# Patient Record
Sex: Female | Born: 1950
Health system: Southern US, Community
[De-identification: ages and names within clinical notes are randomized; demographics above are authoritative.]

## PROBLEM LIST (undated history)

## (undated) DIAGNOSIS — I10 Essential (primary) hypertension: Secondary | ICD-10-CM

## (undated) DIAGNOSIS — D649 Anemia, unspecified: Secondary | ICD-10-CM

## (undated) DIAGNOSIS — M1990 Unspecified osteoarthritis, unspecified site: Secondary | ICD-10-CM

## (undated) DIAGNOSIS — T7840XA Allergy, unspecified, initial encounter: Secondary | ICD-10-CM

## (undated) DIAGNOSIS — E785 Hyperlipidemia, unspecified: Secondary | ICD-10-CM

## (undated) DIAGNOSIS — I2510 Atherosclerotic heart disease of native coronary artery without angina pectoris: Secondary | ICD-10-CM

## (undated) HISTORY — PX: TUBAL LIGATION: SHX77

## (undated) HISTORY — PX: ARTHROSCOPY WITH ANTERIOR CRUCIATE LIGAMENT (ACL) REPAIR WITH ANTERIOR TIBILIAS GRAFT: SHX6503

## (undated) HISTORY — PX: BUNIONECTOMY: SHX129

## (undated) HISTORY — PX: CHOLECYSTECTOMY: SHX55

## (undated) HISTORY — PX: FRACTURE SURGERY: SHX138

## (undated) HISTORY — DX: Hyperlipidemia, unspecified: E78.5

## (undated) HISTORY — DX: Essential (primary) hypertension: I10

## (undated) HISTORY — PX: JOINT REPLACEMENT: SHX530

## (undated) HISTORY — DX: Anemia, unspecified: D64.9

## (undated) HISTORY — PX: APPENDECTOMY: SHX54

## (undated) HISTORY — DX: Allergy, unspecified, initial encounter: T78.40XA

## (undated) HISTORY — PX: ABDOMINAL HYSTERECTOMY: SHX81

---

## 1991-02-12 HISTORY — PX: CARDIAC CATHETERIZATION: SHX172

## 1993-11-12 HISTORY — PX: CARDIAC CATHETERIZATION: SHX172

## 1996-12-03 HISTORY — PX: CARDIAC CATHETERIZATION: SHX172

## 2000-10-10 ENCOUNTER — Encounter: Payer: Self-pay | Admitting: Obstetrics and Gynecology

## 2000-10-10 ENCOUNTER — Ambulatory Visit (HOSPITAL_COMMUNITY): Admission: RE | Admit: 2000-10-10 | Discharge: 2000-10-10 | Payer: Self-pay | Admitting: Obstetrics and Gynecology

## 2000-10-20 ENCOUNTER — Ambulatory Visit (HOSPITAL_COMMUNITY): Admission: RE | Admit: 2000-10-20 | Discharge: 2000-10-20 | Payer: Self-pay | Admitting: Obstetrics and Gynecology

## 2000-10-20 ENCOUNTER — Encounter: Payer: Self-pay | Admitting: Obstetrics and Gynecology

## 2001-10-13 ENCOUNTER — Encounter: Payer: Self-pay | Admitting: Obstetrics and Gynecology

## 2001-10-13 ENCOUNTER — Ambulatory Visit (HOSPITAL_COMMUNITY): Admission: RE | Admit: 2001-10-13 | Discharge: 2001-10-13 | Payer: Self-pay | Admitting: Obstetrics and Gynecology

## 2002-11-05 ENCOUNTER — Encounter: Payer: Self-pay | Admitting: Obstetrics and Gynecology

## 2002-11-05 ENCOUNTER — Ambulatory Visit (HOSPITAL_COMMUNITY): Admission: RE | Admit: 2002-11-05 | Discharge: 2002-11-05 | Payer: Self-pay | Admitting: Obstetrics and Gynecology

## 2002-12-01 ENCOUNTER — Ambulatory Visit (HOSPITAL_COMMUNITY): Admission: RE | Admit: 2002-12-01 | Discharge: 2002-12-01 | Payer: Self-pay | Admitting: Obstetrics and Gynecology

## 2003-08-19 ENCOUNTER — Ambulatory Visit (HOSPITAL_COMMUNITY): Admission: RE | Admit: 2003-08-19 | Discharge: 2003-08-19 | Payer: Self-pay | Admitting: Cardiology

## 2003-08-23 ENCOUNTER — Ambulatory Visit (HOSPITAL_COMMUNITY): Admission: RE | Admit: 2003-08-23 | Discharge: 2003-08-23 | Payer: Self-pay | Admitting: Cardiology

## 2003-08-23 HISTORY — PX: CARDIAC CATHETERIZATION: SHX172

## 2003-11-23 ENCOUNTER — Ambulatory Visit (HOSPITAL_COMMUNITY): Admission: RE | Admit: 2003-11-23 | Discharge: 2003-11-23 | Payer: Self-pay | Admitting: Obstetrics and Gynecology

## 2004-12-07 ENCOUNTER — Encounter: Admission: RE | Admit: 2004-12-07 | Discharge: 2004-12-07 | Payer: Self-pay | Admitting: Obstetrics and Gynecology

## 2005-01-23 ENCOUNTER — Ambulatory Visit (HOSPITAL_COMMUNITY): Admission: RE | Admit: 2005-01-23 | Discharge: 2005-01-23 | Payer: Self-pay | Admitting: Orthopaedic Surgery

## 2005-02-04 HISTORY — PX: ARTHROSCOPY WITH ANTERIOR CRUCIATE LIGAMENT (ACL) REPAIR WITH ANTERIOR TIBILIAS GRAFT: SHX6503

## 2005-03-28 ENCOUNTER — Ambulatory Visit (HOSPITAL_COMMUNITY): Admission: RE | Admit: 2005-03-28 | Discharge: 2005-03-28 | Payer: Self-pay | Admitting: Orthopaedic Surgery

## 2005-04-01 ENCOUNTER — Encounter (HOSPITAL_COMMUNITY): Admission: RE | Admit: 2005-04-01 | Discharge: 2005-05-01 | Payer: Self-pay | Admitting: Orthopaedic Surgery

## 2005-09-18 ENCOUNTER — Ambulatory Visit (HOSPITAL_COMMUNITY): Admission: RE | Admit: 2005-09-18 | Discharge: 2005-09-18 | Payer: Self-pay | Admitting: Obstetrics and Gynecology

## 2005-09-23 HISTORY — PX: BREAST CYST ASPIRATION: SHX578

## 2005-11-20 ENCOUNTER — Ambulatory Visit (HOSPITAL_COMMUNITY): Admission: RE | Admit: 2005-11-20 | Discharge: 2005-11-20 | Payer: Self-pay | Admitting: Obstetrics and Gynecology

## 2006-10-01 ENCOUNTER — Ambulatory Visit (HOSPITAL_COMMUNITY): Admission: RE | Admit: 2006-10-01 | Discharge: 2006-10-01 | Payer: Self-pay | Admitting: Obstetrics and Gynecology

## 2007-10-28 ENCOUNTER — Ambulatory Visit (HOSPITAL_COMMUNITY): Admission: RE | Admit: 2007-10-28 | Discharge: 2007-10-28 | Payer: Self-pay | Admitting: Obstetrics and Gynecology

## 2007-12-22 HISTORY — PX: OTHER SURGICAL HISTORY: SHX169

## 2008-06-07 ENCOUNTER — Ambulatory Visit (HOSPITAL_COMMUNITY): Admission: RE | Admit: 2008-06-07 | Discharge: 2008-06-07 | Payer: Self-pay | Admitting: Pulmonary Disease

## 2008-12-06 ENCOUNTER — Ambulatory Visit (HOSPITAL_COMMUNITY): Admission: RE | Admit: 2008-12-06 | Discharge: 2008-12-06 | Payer: Self-pay | Admitting: Obstetrics and Gynecology

## 2009-09-06 ENCOUNTER — Other Ambulatory Visit: Admission: RE | Admit: 2009-09-06 | Discharge: 2009-09-06 | Payer: Self-pay | Admitting: Obstetrics and Gynecology

## 2009-12-07 ENCOUNTER — Ambulatory Visit (HOSPITAL_COMMUNITY): Admission: RE | Admit: 2009-12-07 | Discharge: 2009-12-07 | Payer: Self-pay | Admitting: Obstetrics and Gynecology

## 2010-02-01 ENCOUNTER — Ambulatory Visit: Admit: 2010-02-01 | Payer: Self-pay | Admitting: Gastroenterology

## 2010-02-13 ENCOUNTER — Encounter (INDEPENDENT_AMBULATORY_CARE_PROVIDER_SITE_OTHER): Payer: Self-pay

## 2010-03-08 NOTE — Letter (Signed)
Summary: Recall, Screening Colonoscopy Only  [facility]  [address]   [place], [place] [address]   Phone: [phone]  Fax: [phone]    February 13, 2010  BRITTYN SALAZ [address] [place], [place]  [address] [birth year]   Dear Ms. Eliberto Ivory,   Our records indicate it is time to schedule your colonoscopy.   Please call our office at [phone] and ask for the nurse.   Thank you,  Hendricks Limes, LPN Cloria Spring, LPN  [facility] Ph: [phone]   Fax: [phone]

## 2010-06-22 NOTE — Op Note (Signed)
NAMESAVANAH, Alyssa Navarro               ACCOUNT NO.:  [id number]   MEDICAL RECORD NO.:  [id number]          PATIENT TYPE:  AMB   LOCATION:  DAY                           FACILITY:  [facility]   PHYSICIAN:  J. Darreld Mclean, M.D. DATE OF BIRTH:  [birth year]   DATE OF PROCEDURE:  03/28/2005  DATE OF DISCHARGE:                                 OPERATIVE REPORT   PREOPERATIVE DIAGNOSIS:  Torn medial meniscus of the right knee.   POSTOPERATIVE DIAGNOSIS:  Torn medial meniscus of the right knee.   PROCEDURE:  Operative arthroscopy of the right knee, partial medial  meniscectomy and debridement of the lateral meniscus frayed tissue.   ANESTHESIA:  Spinal.   SURGEON:  Dr. Hilda Lias.   TOURNIQUET TIME:  26 minutes.   DRAINS:  No drains.   INDICATIONS:  The patient is a 60 year old female with pain and tenderness  in the right knee. MRI was positive for tear of the medial meniscus. She has  giving way and swelling. It has not improved with conservative treatment.  Arthroscopy has been recommended. Risks and imponderables have been  discussed with the patient preoperatively. She appears to understand and  agrees to the procedure.   DESCRIPTION OF PROCEDURE:  The patient was seen in the holding area. The  right knee was identified as the correct surgical site. She was placed a  mark on the right knee; I placed mark on the right knee. She was brought  back to the operating room and given spinal anesthesia. She was placed  supine on the operating room table. Tourniquet placed deflated right upper  thigh and leg holder attached. The patient prepped and draped in the usual  manner.   We had a time out, identifying the patient. The leg was elevated and wrapped  circumferentially with Esmarch bandage. Tourniquet inflated to 300 mmHg.  Inflow cannula was inserted medially. Lactated ringers were instilled into  the knee by an infusion pump. Arthroscope inserted laterally. The knee was  systematically  examined.   FINDINGS:  Suprapatellar pouch with mild synovitis. There was some  degenerative changes, particularly on the medial aspect of the lateral  patella and the femoral area. Medially, there was grade changes in the  femoral condyle and grade 2 to 3 changes tibial plateau. There was a tear in  the posterior  horn in the medial meniscus. Anterior cruciate was intact.  Laterally, there was significant fraying particularly anterior to  anterolateral but no tear. Articular surfaces laterally had grade 2 changes.  No loose bodies were present.   Using a meniscal punch and a meniscal shaver, good smooth contour was  developed of the posterior horn of the medial meniscus, and then I used a  laser to get even a better contour and to get some small particles. There  was a smooth appearance. This was done meticulously and slowly. Permanent  pictures were taken throughout the procedure. I used the laser on the  lateral side on the lateral meniscus and took care of the fraying anterior  and anterolateral of the lateral meniscus. Knee was systematically  reexamined. No  new pathology found. Permanent pictures were taken.   The knee was irrigated with remaining part of lactated ringers. Wounds were  reapproximated using 3-0 nylon interrupted vertical mattress manner.  Marcaine 0.25% instilled into the knee. Tourniquet deflated after 26  minutes. Sterile dressing applied. Bulky dressing applied. The patient  tolerated the procedure well. Prescription was given for Vicodin ES for  pain. We will the patient in the office in 10 days to 2 weeks. Physical  therapy has been arranged. If any difficulties, she is to contact me through  the office or hospital beeper system. Numbers have been provided.           ______________________________  Shela Commons. Darreld Mclean, M.D.     JWK/MEDQ  D:  03/28/2005  T:  03/28/2005  Job:  [id number]

## 2010-06-22 NOTE — H&P (Signed)
NAMEKELLYANN, ORDWAY               ACCOUNT NO.:  [id number]   MEDICAL RECORD NO.:  [id number]          PATIENT TYPE:  AMB   LOCATION:  DAY                           FACILITY:  [facility]   PHYSICIAN:  J. Darreld Mclean, M.D. DATE OF BIRTH:  1950/09/25   DATE OF ADMISSION:  03/27/2005  DATE OF DISCHARGE:  LH                                HISTORY & PHYSICAL   CHIEF COMPLAINT:  My right knee hurts.   HISTORY OF PRESENT ILLNESS:  The patient has been having pain and tenderness  in her right knee on and off for many months.  I saw her in the office on  January 22, 2005.  I was concerned that she had a medial meniscal tear.  I  recommended MRI of the knee.  She had given way, swelling pain, popping.  MRI was done at the hospital on January 23, 2005.  It showed  tricompartmental degenerative changes, most notably the patella femoral  medial compartment.  There was a complex tear of the medial meniscus.  She  had a large joint effusion with synovitis.   Informed her of the findings the day after Christmas.  I injected her knee  at that time with Depo-Medrol.  She wanted to consider arthroscopy after the  first of the year.  Saw her on January 8, and at that time, she wanted to go  ahead and have the surgery on February 22.  Surgery was scheduled.  Risks  and imponderables of the procedure have been discussed with her.  She  appears to understand and reviewed the procedure as outlined.   PAST MEDICAL HISTORY:  1.  Heart disease.  2.  Hypertension.   ALLERGIES:  SULFA, LATEX.   MEDICATIONS:  1.  Norvasc 10 mg daily.  2.  Crestor 40 mg daily.  3.  Zetia 10 mg daily.  4.  Fosamax weekly.  5.  Aspirin 81 mg daily.  6.  Vivelle patch 0.075 mg biweekly.   SOCIAL HISTORY:  She does not smoke.  She does not use alcoholic beverages.  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  The patient is divorced and lives in [place].  Dr. Shaune Pollack is  her family doctor.  She also sees Dr. Elsie Lincoln at Cardiology, Dr.  Emelda Fear and Dr. Nile Riggs.   PAST SURGICAL HISTORY:  1.  BTL in 1982 by Dr. Daphine Deutscher.  2.  Serafina Royals in 1998 by Dr. Pricilla Holm.  3.  Vaginal hysterectomy in 2002 by Dr. Emelda Fear.  4.  Cardiac catheterization in 1993 and 1995 by Dr. Elsie Lincoln in [facility].  5.  Cardiac catheterization placement of two stents October 1998 by Dr.      Elsie Lincoln.  6.  Cardiac catheterization in July 2005 by Dr. Elsie Lincoln.   FAMILY HISTORY:  Diabetes, coronary artery disease, hypertension,  osteoporosis runs in the family.   PHYSICAL EXAMINATION:  VITAL SIGNS:  Blood pressure 147/92, pulse 64,  respirations 16, afebrile, height 5 feet 1-3/4 inches, weight 154.  GENERAL APPEARANCE:  Patient alert, cooperative and oriented.  HEENT:  Negative.  NECK:  Supple.  LUNGS:  Clear to P&A.  HEART:  Regular without murmur heard.  ABDOMEN:  Soft, nontender without masses.  EXTREMITIES:  Right knee has some swelling and effusion, tenderness with  range of motion.  She has pain and tenderness medially.  Knee is stable.  Other extremities are within normal limits.  CNS:  Intact.  SKIN:  Intact.   IMPRESSION:  1.  Tear to medial meniscus of the knee on the right.  2.  History of heart disease status post placement of stents.  3.  History of hypertension.  4.  History of Latex allergy.   PLAN:  1.  Fluoroscopy of the right knee under anesthesia.  2.  Partial meniscectomy medially.  3.  Discussed with the patient plan and procedure, risks and imponderables.      Labs are pending.  She appears to understand and agrees to proceed as      outlined.                                            ______________________________  J. Darreld Mclean, M.D.     JWK/MEDQ  D:  03/27/2005  T:  03/27/2005  Job:  [id number]

## 2010-06-22 NOTE — Cardiovascular Report (Signed)
Alyssa Navarro, COURTNEY                         ACCOUNT NO.:  [id number]   MEDICAL RECORD NO.:  [id number]                   PATIENT TYPE:  OIB   LOCATION:  2852                                 FACILITY:  [facility]   PHYSICIAN:  Madaline Savage, M.D.             DATE OF BIRTH:  May 16, 1950   DATE OF PROCEDURE:  08/23/2003  DATE OF DISCHARGE:                              CARDIAC CATHETERIZATION   PROCEDURES PERFORMED:  1. Selective coronary angiography by Judkins technique.  2. Retrograde left heart catheterization.  3. Left ventricular angiography.  4. Abdominal aortography.   COMPLICATIONS:  None.   ENTRY SITE:  Right femoral.   DYE USED:  Omnipaque.   PRECAUTIONS USED:  The patient has a latex allergy and latex-free products  were used during the entirety of the procedure.   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  She has been there for over 15 years.  Alyssa Navarro has hypertension, known  coronary disease and treated hyperlipidemia.  She is a medical patient of  Dr. Kari Baars.  A Cardiolite stress test two years ago was negative.  Ejection fraction was in the low 60s.  Recent  Cardiolite stress test showed  mild anterolateral wall ischemia and a left ventricular ejection fraction of  57%.  The patient has had some trouble controlling her blood pressure  recently and has been having some chest pain that resembles angina.  Given  the fact that she has had left coronary stenting to a dominant circumflex  coronary artery on December 03, 1996 with two overlapping stents of 3.5 mm  diameter and total length of approximately 40 mm, it was decided to bring  her back today for outpatient cardiac catheterization which was completed  without complications.   RESULTS:   PRESSURES:  Left ventricular pressure was 150/12, end-diastolic pressure 18.  Central aortic pressure  150/90, mean of 120.  No aortic valve gradient by  pullback technique.   ANGIOGRAPHIC RESULTS:  The patient's coronary arteries do show calcification  in the mid portion of the LAD and in the proximal portion of the left main  and circumflex coronary artery.  This is mild.  The left main coronary  artery is a 5 mm in diameter vessel that is fairly short and no lesions are  seen.  The LAD gives rise to a first septal perforator branch only 10 or 12  mm away from the left main and this first septal perforator branch has a 90%  stenosis before the vessel bifurcates.  This septal is small to medium in  size.   The mid LAD contains calcification and marked irregularity with ectasia and  shelf-like areas of plaque formation that is not obstructive.  I see no more  than 20-30% areas of obstruction in that area.  The LAD beyond  the second  major diagonal branch is relatively free of any significant obstructive  lesions whatsoever and no calcifications are noted.  The area that I am  speaking of compromises two-thirds of the LAD itself which is an artery that  wraps around the apex and is co-dominant with the circumflex in terms of  vessel dominance.  The first diagonal branch is not seen well.  It is very  small.  The second diagonal branch is a 1.5 mm vessel containing a 90%  stenosis in its mid portion.  It does not appear to be a candidate for  intervention.   The circumflex is a huge dominant vessel giving rise to an atrial circumflex  branch and obtuse marginal branch which trifurcates a second obtuse marginal  branch in the mid portion of the vessel and distally the circumflex becomes  a huge posterior descending branch and a posterior lateral branch.  Overall,  this dominant circumflex has luminal irregularity, but no obstructive  lesions anywhere.  The overlapped site between the proximal and mid stent  contains an area of hypodensity that is felt to be nonobstructive.  TIMI-3  flow is  3.  The PDA throughout its course is normal as is the posterior  lateral.  This circumflex looks great now 8 years status post stent  implantation in the mid portion of the vessel.   The right coronary artery is nondominant and it appears as it did at the  time of last catheterization December 03, 1996.  Specifically, it is  nondominant and just beyond its first side branch there is a 90% stenosis.  The remainder of the trifurcating vessel is normal and small.   Left ventricular angiography shows normal contractility.  Ejection fraction  55%.  No LV thrombus noted.  Abdominal aorta shows smooth contour to the  entirety of the aorta below the renals.  Both renal arteries are normal and  both common iliacs appear normal.   FINAL DIAGNOSES:  1. Trivial small vessel coronary artery disease as described above.  2. Patent left circumflex stents placed in 1998.  3. Normal left ventricular systolic function.  Ejection fraction 55%.  4. Normal renal arteries.  5. Normal abdominal aorta.   PLAN:  Continued medical therapy, control of blood pressure, aggressive  lipid therapy and aspirin.  If the patient continues to have chest pain I do  see a role for Plavix over the next 6-12 months.                                               Madaline Savage, M.D.    WHG/MEDQ  D:  08/23/2003  T:  08/23/2003  Job:  [id number]   cc:   Ramon Dredge L. Juanetta Gosling, M.D.  [address]  [place]  [place] [address]  Fax: [phone]   [facility]  [place], [place]

## 2010-12-19 ENCOUNTER — Other Ambulatory Visit: Payer: Self-pay | Admitting: Obstetrics and Gynecology

## 2010-12-19 DIAGNOSIS — Z139 Encounter for screening, unspecified: Secondary | ICD-10-CM

## 2011-01-01 ENCOUNTER — Ambulatory Visit (HOSPITAL_COMMUNITY)
Admission: RE | Admit: 2011-01-01 | Discharge: 2011-01-01 | Disposition: A | Discharge status: Home / Self Care | Payer: 59 | Source: Ambulatory Visit | Attending: Obstetrics and Gynecology | Admitting: Obstetrics and Gynecology

## 2011-01-01 DIAGNOSIS — Z139 Encounter for screening, unspecified: Secondary | ICD-10-CM

## 2011-01-01 DIAGNOSIS — Z1231 Encounter for screening mammogram for malignant neoplasm of breast: Secondary | ICD-10-CM | POA: Insufficient documentation

## 2011-01-08 ENCOUNTER — Other Ambulatory Visit: Payer: Self-pay | Admitting: Obstetrics and Gynecology

## 2011-01-08 DIAGNOSIS — R928 Other abnormal and inconclusive findings on diagnostic imaging of breast: Secondary | ICD-10-CM

## 2011-01-23 ENCOUNTER — Ambulatory Visit (HOSPITAL_COMMUNITY)
Admission: RE | Admit: 2011-01-23 | Discharge: 2011-01-23 | Disposition: A | Discharge status: Home / Self Care | Payer: 59 | Source: Ambulatory Visit | Attending: Obstetrics and Gynecology | Admitting: Obstetrics and Gynecology

## 2011-01-23 ENCOUNTER — Other Ambulatory Visit (HOSPITAL_COMMUNITY): Payer: Self-pay | Admitting: Obstetrics and Gynecology

## 2011-01-23 DIAGNOSIS — N6009 Solitary cyst of unspecified breast: Secondary | ICD-10-CM | POA: Insufficient documentation

## 2011-01-23 DIAGNOSIS — R928 Other abnormal and inconclusive findings on diagnostic imaging of breast: Secondary | ICD-10-CM

## 2011-09-25 ENCOUNTER — Other Ambulatory Visit: Payer: Self-pay | Admitting: Obstetrics and Gynecology

## 2011-09-25 ENCOUNTER — Other Ambulatory Visit (HOSPITAL_COMMUNITY)
Admission: RE | Admit: 2011-09-25 | Discharge: 2011-09-25 | Disposition: A | Discharge status: Home / Self Care | Payer: 59 | Source: Ambulatory Visit | Attending: Obstetrics and Gynecology | Admitting: Obstetrics and Gynecology

## 2011-09-25 DIAGNOSIS — Z01419 Encounter for gynecological examination (general) (routine) without abnormal findings: Secondary | ICD-10-CM | POA: Insufficient documentation

## 2012-09-10 ENCOUNTER — Emergency Department (HOSPITAL_COMMUNITY)
Admission: EM | Admit: 2012-09-10 | Discharge: 2012-09-10 | Disposition: A | Discharge status: Home / Self Care | Payer: BC Managed Care – PPO | Attending: Emergency Medicine | Admitting: Emergency Medicine

## 2012-09-10 ENCOUNTER — Encounter (HOSPITAL_COMMUNITY): Payer: Self-pay | Admitting: *Deleted

## 2012-09-10 DIAGNOSIS — Z79899 Other long term (current) drug therapy: Secondary | ICD-10-CM | POA: Insufficient documentation

## 2012-09-10 DIAGNOSIS — Z7982 Long term (current) use of aspirin: Secondary | ICD-10-CM | POA: Insufficient documentation

## 2012-09-10 DIAGNOSIS — I2510 Atherosclerotic heart disease of native coronary artery without angina pectoris: Secondary | ICD-10-CM | POA: Insufficient documentation

## 2012-09-10 DIAGNOSIS — Y92009 Unspecified place in unspecified non-institutional (private) residence as the place of occurrence of the external cause: Secondary | ICD-10-CM | POA: Insufficient documentation

## 2012-09-10 DIAGNOSIS — W268XXA Contact with other sharp object(s), not elsewhere classified, initial encounter: Secondary | ICD-10-CM | POA: Insufficient documentation

## 2012-09-10 DIAGNOSIS — Z23 Encounter for immunization: Secondary | ICD-10-CM | POA: Insufficient documentation

## 2012-09-10 DIAGNOSIS — Y93G3 Activity, cooking and baking: Secondary | ICD-10-CM | POA: Insufficient documentation

## 2012-09-10 DIAGNOSIS — Z91040 Latex allergy status: Secondary | ICD-10-CM | POA: Insufficient documentation

## 2012-09-10 DIAGNOSIS — S61209A Unspecified open wound of unspecified finger without damage to nail, initial encounter: Secondary | ICD-10-CM | POA: Insufficient documentation

## 2012-09-10 HISTORY — DX: Atherosclerotic heart disease of native coronary artery without angina pectoris: I25.10

## 2012-09-10 MED ORDER — LIDOCAINE HCL (PF) 2 % IJ SOLN
INTRAMUSCULAR | Status: AC
  Filled 2012-09-10: qty 10

## 2012-09-10 MED ORDER — TETANUS-DIPHTH-ACELL PERTUSSIS 5-2.5-18.5 LF-MCG/0.5 IM SUSP
INTRAMUSCULAR | Status: AC
  Filled 2012-09-10: qty 0.5

## 2012-09-10 MED ORDER — TETANUS-DIPHTH-ACELL PERTUSSIS 5-2.5-18.5 LF-MCG/0.5 IM SUSP
0.5000 mL | Freq: Once | INTRAMUSCULAR | Status: AC
  Administered 2012-09-10: 0.5 mL via INTRAMUSCULAR

## 2012-09-10 NOTE — ED Notes (Signed)
Laceration to rt 5th finger when shredding squash.

## 2012-09-13 NOTE — ED Provider Notes (Signed)
CSN: [id number]     Arrival date & time 09/10/12  1319 History     First MD Initiated Contact with Patient 09/10/12 1413     Chief Complaint  Patient presents with  . Extremity Laceration   (Consider location/radiation/quality/duration/timing/severity/associated sxs/prior Treatment) HPI Comments: Alyssa Navarro is a 62 y.o. Female presenting with avulsion laceration to her right 5th finger which occurred just prior to arrival while shredding squash at home.  She applied pressure but was unable to control the bleeding so presented here.  She reports minimal aching pain at the site, tender to palption and has sensation of the distal finger tip.  Her tetanus needs updating.     The history is provided by the patient.    Past Medical History  Diagnosis Date  . Coronary artery disease    Past Surgical History  Procedure Laterality Date  . Coronary stent placement     History reviewed. No pertinent family history. History  Substance Use Topics  . Smoking status: Never Smoker   . Smokeless tobacco: Not on file  . Alcohol Use: No   OB History   Grav Para Term Preterm Abortions TAB SAB Ect Mult Living                 Review of Systems  Constitutional: Negative for fever and chills.  HENT: Negative for facial swelling.   Respiratory: Negative for shortness of breath and wheezing.   Skin: Positive for wound.  Neurological: Negative for numbness.    Allergies  Latex and Sulfa antibiotics  Home Medications   Current Outpatient Rx  Name  Route  Sig  Dispense  Refill  . amLODipine (NORVASC) 10 MG tablet   Oral   Take 10 mg by mouth daily.         Marland Kitchen aspirin EC 81 MG tablet   Oral   Take 81 mg by mouth once a week. Takes on Monday         . atorvastatin (LIPITOR) 20 MG tablet   Oral   Take 20 mg by mouth daily.         . Calcium-Magnesium-Zinc (CAL-MAG-ZINC PO)   Oral   Take 1 tablet by mouth daily.         . cholecalciferol (VITAMIN D) 1000 UNITS tablet   Oral   Take 2,000 Units by mouth daily.         . Coenzyme Q10 (CO Q10) 100 MG CAPS   Oral   Take 100 mg by mouth daily.         Marland Kitchen estradiol (VIVELLE-DOT) 0.1 MG/24HR patch   Transdermal   Place 1 patch onto the skin 2 (two) times a week.         Marland Kitchen KRILL OIL PO   Oral   Take 1 tablet by mouth daily.         . metoprolol succinate (TOPROL-XL) 25 MG 24 hr tablet   Oral   Take 25 mg by mouth daily.          BP 125/90  Pulse 83  Temp(Src) 98.3 F (36.8 C) (Oral)  Resp 19  Ht 5\' 1"  (1.549 m)  Wt 135 lb (61.236 kg)  BMI 25.52 kg/m2  SpO2 96% Physical Exam  Constitutional: She is oriented to person, place, and time. She appears well-developed and well-nourished.  HENT:  Head: Normocephalic.  Cardiovascular: Normal rate.   Pulmonary/Chest: Effort normal.  Musculoskeletal: She exhibits tenderness.  Right hand: She exhibits tenderness and laceration. She exhibits normal range of motion, no bony tenderness and normal capillary refill. Normal sensation noted.  Avulsion through lateral half of distal nail plate of right 5th finger,  Involving approx 2 mm of the underlying nailbed which continues to ooze despite gentle pressure.  No bony involvement appreciated.  No remaining skin flap,  This is a clean superficial avulsion.  Neurological: She is alert and oriented to person, place, and time. No sensory deficit.  Skin: Laceration noted.    ED Course   Procedures (including critical care time)  No suturing required for this injury.  Pt's wound was cleaned with betadine,  Then syringe irrigation with NS.  Surgicel applied to the finger tip after the nail was trimmed down,  Obtaining clear hemostasis.  Observed for 10 minutes with continued hemostasis.  Bulky dressing then applied by RN.    Tetanus updated.  Labs Reviewed - No data to display No results found. 1. Avulsion, finger tip, initial encounter     MDM  Anticipate prn f/u.  Pt offered pain med script  which she deferred. Advised to remove surgical tomorrow,  Keep clean and dry, covered.  Return here or see pcp for any problems or concerns.  Pt understands plan.  Burgess Amor, PA-C 09/13/12 1038

## 2012-09-14 ENCOUNTER — Other Ambulatory Visit: Payer: Self-pay | Admitting: *Deleted

## 2012-09-14 MED ORDER — METOPROLOL SUCCINATE ER 25 MG PO TB24: 25.0000 mg | ORAL_TABLET | Freq: Every day | ORAL | Status: DC

## 2012-09-14 NOTE — ED Provider Notes (Signed)
Medical screening examination/treatment/procedure(s) were performed by non-physician practitioner and as supervising physician I was immediately available for consultation/collaboration.  Geoffery Lyons, MD 09/14/12 [phone]

## 2012-10-11 ENCOUNTER — Other Ambulatory Visit: Payer: Self-pay | Admitting: Adult Health

## 2012-10-23 ENCOUNTER — Encounter: Payer: Self-pay | Admitting: Cardiovascular Disease

## 2012-10-23 ENCOUNTER — Ambulatory Visit (INDEPENDENT_AMBULATORY_CARE_PROVIDER_SITE_OTHER): Payer: BC Managed Care – PPO | Admitting: Cardiovascular Disease

## 2012-10-23 VITALS — BP 154/88 | HR 62 | Resp 16 | Ht 61.0 in | Wt 137.2 lb

## 2012-10-23 DIAGNOSIS — I2510 Atherosclerotic heart disease of native coronary artery without angina pectoris: Secondary | ICD-10-CM

## 2012-10-23 DIAGNOSIS — E785 Hyperlipidemia, unspecified: Secondary | ICD-10-CM

## 2012-10-23 DIAGNOSIS — Z79899 Other long term (current) drug therapy: Secondary | ICD-10-CM

## 2012-10-23 DIAGNOSIS — E782 Mixed hyperlipidemia: Secondary | ICD-10-CM

## 2012-10-23 DIAGNOSIS — I10 Essential (primary) hypertension: Secondary | ICD-10-CM

## 2012-10-23 NOTE — Patient Instructions (Addendum)
Your physician recommends that you have FASTING lab work at your convenience.   Your physician recommends that you schedule a follow-up appointment in: 3 months.

## 2012-10-25 NOTE — Progress Notes (Signed)
Patient ID: Alyssa Navarro, female   DOB: [birth year], 62 y.o.   MRN: [id number]     Reason for office visit CAD  [name] has done well since her last appointment, but has been preoccupied by her aging mother. She has been travelling frequently to [place] and admits to paying less attention to her own health. She did not have her labs drawn as planned.  She denies any cardiac complaints. Her BP is high. She says it usually runs in 120s/70s. 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    Allergies  Allergen Reactions  . Avelox [Moxifloxacin Hcl In Nacl]   . Statins     Myalgias   . Latex Rash  . Sulfa Antibiotics Rash    Current Outpatient Prescriptions  Medication Sig Dispense Refill  . amLODipine (NORVASC) 10 MG tablet Take 10 mg by mouth daily.      Marland Kitchen atorvastatin (LIPITOR) 20 MG tablet Take 20 mg by mouth daily.      . Calcium-Magnesium-Zinc (CAL-MAG-ZINC PO) Take 2-3 tablets by mouth daily.       . cholecalciferol (VITAMIN D) 1000 UNITS tablet Take 2,000 Units by mouth daily.      . Coenzyme Q10 (CO Q10) 100 MG CAPS Take 100 mg by mouth daily.      Marland Kitchen KRILL OIL PO Take 1 tablet by mouth daily.      . metoprolol succinate (TOPROL-XL) 25 MG 24 hr tablet Take 12.5 mg by mouth daily.      Marland Kitchen VIVELLE-DOT 0.1 MG/24HR patch APPLY 1 PATCH TRANSDERMALLY TWICE A WEEK  24 patch  PRN   No current facility-administered medications for this visit.    Past Medical History  Diagnosis Date  . Coronary artery disease   . Hyperlipidemia   . Hypertension     Past Surgical History  Procedure Laterality Date  . Cardiac catheterization  02/12/1991    CAD demonstrated in Proximal LAD-50% stenosis, Distal LAD-60%, Mid Circumflex-60% stenosis, Marginal Circumflex-80% stenosis, Proximal RCA-70% stenosis  . Cardiac catheterization  11/12/1993    Mild-moderate 3-vessel disease-50% mid LAD, 75% mid circumflex, 80% ostial portion L circumflex.  . Cardiac catheterization  12/03/1996    95% mid circumflex  stenosis stented with a 3.5x68mm Sci-Med NIR, resulting in reduciton to 0%. 75% distal circumflex stenosis stented with a 3.0x39mm NIR stent resulting in reduciton to 0%.  . Cardiac catheterization  08/23/2003    Continued medical therapy. Patent L circumflex stents placed in 1998.  Marland Kitchen Lexiscan myoview  12/22/2007    No ECG changes, nondiagnostic EKG. Perfusion defect in inferior myocardial region consistent with diaphragmatic attenuation.  . 2d echocardiogram  12/22/2007    EF >55%, normal.    Family History  Problem Relation Age of Onset  . Diabetes Mother   . Hypertension Mother   . Hyperlipidemia Mother   . Heart failure Other   . Hypertension Other   . Diabetes Other     History   Social History  . Marital Status: Divorced    Spouse Name: N/A    Number of Children: N/A  . Years of Education: N/A   Occupational History  . Not on file.   Social History Main Topics  . Smoking status: Never Smoker   . Smokeless tobacco: Not on file  . Alcohol Use: No  . Drug Use: No  . Sexual Activity: Not on file   Other Topics Concern  . Not on file   Social History Narrative  .  No narrative on file    Review of systems: The patient specifically denies any chest pain at rest or with exertion, dyspnea at rest or with exertion, orthopnea, paroxysmal nocturnal dyspnea, syncope, palpitations, focal neurological deficits, intermittent claudication, lower extremity edema, unexplained weight gain, cough, hemoptysis or wheezing.  The patient also denies abdominal pain, nausea, vomiting, dysphagia, diarrhea, constipation, polyuria, polydipsia, dysuria, hematuria, frequency, urgency, abnormal bleeding or bruising, fever, chills, unexpected weight changes, mood swings, change in skin or hair texture, change in voice quality, auditory or visual problems, allergic reactions or rashes, new musculoskeletal complaints other than usual "aches and pains".   PHYSICAL EXAM BP 154/88  Pulse 62  Resp  16  Ht 5\' 1"  (1.549 m)  Wt 137 lb 3.2 oz (62.234 kg)  BMI 25.94 kg/m2  General: Alert, oriented x3, no distress Head: no evidence of trauma, PERRL, EOMI, no exophtalmos or lid lag, no myxedema, no xanthelasma; normal ears, nose and oropharynx Neck: normal jugular venous pulsations and no hepatojugular reflux; brisk carotid pulses without delay and no carotid bruits Chest: clear to auscultation, no signs of consolidation by percussion or palpation, normal fremitus, symmetrical and full respiratory excursions Cardiovascular: normal position and quality of the apical impulse, regular rhythm, normal first and second heart sounds, no murmurs, rubs or gallops Abdomen: no tenderness or distention, no masses by palpation, no abnormal pulsatility or arterial bruits, normal bowel sounds, no hepatosplenomegaly Extremities: no clubbing, cyanosis or edema; 2+ radial, ulnar and brachial pulses bilaterally; 2+ right femoral, posterior tibial and dorsalis pedis pulses; 2+ left femoral, posterior tibial and dorsalis pedis pulses; no subclavian or femoral bruits Neurological: grossly nonfocal   EKG: NSR, normal   ASSESSMENT AND PLAN CAD (coronary artery disease)  Angina free and asymptomatic. No need for new revascularization procedures since 1998.  Hyperlipidemia LDL goal < 70 Gave her a new lab slip. Reviewed healthy diet/activity.  HTN (hypertension) She insists BP usually much lower at home. Agrees to keep a log and bring to office.   Orders Placed This Encounter  Procedures  . Lipid Profile  . Comp Met (CMET)   Meds ordered this encounter  Medications  . metoprolol succinate (TOPROL-XL) 25 MG 24 hr tablet    Sig: Take 12.5 mg by mouth daily.    Junious Silk, MD, [facility] [phone] office [phone] pager

## 2012-10-26 ENCOUNTER — Encounter: Payer: Self-pay | Admitting: Cardiovascular Disease

## 2012-10-26 DIAGNOSIS — I2510 Atherosclerotic heart disease of native coronary artery without angina pectoris: Secondary | ICD-10-CM | POA: Insufficient documentation

## 2012-10-26 DIAGNOSIS — E782 Mixed hyperlipidemia: Secondary | ICD-10-CM | POA: Insufficient documentation

## 2012-10-26 DIAGNOSIS — I10 Essential (primary) hypertension: Secondary | ICD-10-CM | POA: Insufficient documentation

## 2012-10-26 NOTE — Assessment & Plan Note (Signed)
Angina free and asymptomatic. No need for new revascularization procedures since 1998.

## 2012-10-26 NOTE — Assessment & Plan Note (Signed)
She insists BP usually much lower at home. Agrees to keep a log and bring to office.

## 2012-10-26 NOTE — Assessment & Plan Note (Signed)
Gave her a new lab slip. Reviewed healthy diet/activity.

## 2012-10-30 ENCOUNTER — Encounter: Payer: Self-pay | Admitting: Cardiovascular Disease

## 2012-11-10 ENCOUNTER — Other Ambulatory Visit: Payer: Self-pay | Admitting: Cardiology

## 2012-11-10 LAB — LIPID PANEL
Cholesterol: 236 mg/dL — ABNORMAL HIGH (ref 0–200)
HDL: 58 mg/dL
LDL Cholesterol: 157 mg/dL — ABNORMAL HIGH (ref 0–99)
Total CHOL/HDL Ratio: 4.1 ratio
Triglycerides: 107 mg/dL
VLDL: 21 mg/dL (ref 0–40)

## 2012-11-10 LAB — COMPREHENSIVE METABOLIC PANEL WITH GFR
ALT: 26 U/L (ref 0–35)
AST: 21 U/L (ref 0–37)
Albumin: 4 g/dL (ref 3.5–5.2)
Alkaline Phosphatase: 79 U/L (ref 39–117)
BUN: 12 mg/dL (ref 6–23)
CO2: 30 meq/L (ref 19–32)
Calcium: 9.6 mg/dL (ref 8.4–10.5)
Chloride: 105 meq/L (ref 96–112)
Creat: 0.65 mg/dL (ref 0.50–1.10)
Glucose, Bld: 88 mg/dL (ref 70–99)
Potassium: 3.9 meq/L (ref 3.5–5.3)
Sodium: 141 meq/L (ref 135–145)
Total Bilirubin: 0.6 mg/dL (ref 0.3–1.2)
Total Protein: 6.6 g/dL (ref 6.0–8.3)

## 2012-11-12 ENCOUNTER — Telehealth: Payer: Self-pay | Admitting: *Deleted

## 2012-11-12 MED ORDER — EZETIMIBE 10 MG PO TABS: 10.0000 mg | ORAL_TABLET | Freq: Every day | ORAL | Status: DC

## 2012-11-12 NOTE — Telephone Encounter (Signed)
zetia 10mg  qd sent to [facility], [place].

## 2013-01-15 ENCOUNTER — Ambulatory Visit: Payer: BC Managed Care – PPO | Admitting: Cardiovascular Disease

## 2013-01-23 ENCOUNTER — Other Ambulatory Visit: Payer: Self-pay | Admitting: Cardiovascular Disease

## 2013-01-25 NOTE — Telephone Encounter (Signed)
Rx was sent to pharmacy electronically. 

## 2013-02-12 ENCOUNTER — Ambulatory Visit (INDEPENDENT_AMBULATORY_CARE_PROVIDER_SITE_OTHER): Payer: BC Managed Care – PPO | Admitting: Cardiovascular Disease

## 2013-02-12 ENCOUNTER — Encounter: Payer: Self-pay | Admitting: Cardiovascular Disease

## 2013-02-12 VITALS — BP 140/88 | HR 72 | Resp 16 | Ht 61.0 in | Wt 136.5 lb

## 2013-02-12 DIAGNOSIS — E782 Mixed hyperlipidemia: Secondary | ICD-10-CM

## 2013-02-12 DIAGNOSIS — I2510 Atherosclerotic heart disease of native coronary artery without angina pectoris: Secondary | ICD-10-CM

## 2013-02-12 DIAGNOSIS — Z79899 Other long term (current) drug therapy: Secondary | ICD-10-CM

## 2013-02-12 DIAGNOSIS — E785 Hyperlipidemia, unspecified: Secondary | ICD-10-CM

## 2013-02-12 DIAGNOSIS — I10 Essential (primary) hypertension: Secondary | ICD-10-CM

## 2013-02-12 NOTE — Assessment & Plan Note (Signed)
Will give her a lab slip to recheck lipids later this month. Ideally we should get her LDL cholesterol less than 70, and it is quite likely will have to maximize the dose of atorvastatin in addition to continuing zetia.

## 2013-02-12 NOTE — Assessment & Plan Note (Signed)
Her blood pressure is borderline today. She states that she was in a hurry because she was late for appointment. No changes to make her medications.

## 2013-02-12 NOTE — Patient Instructions (Signed)
Your physician recommends that you return for lab work in: When convenient at [facility] in [place].  Your physician recommends that you schedule a follow-up appointment in: ONE YEAR.

## 2013-02-12 NOTE — Progress Notes (Signed)
Patient ID: Alyssa Navarro, female   DOB: [birth year], 63 y.o.   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 She required percutaneous revascularization in 1998 (3.5x25 NIR to mid dominant left circumflex artery, 3.0x16 NIR to distal LCX, 50-60% mid LAD untreated), but no further procedure since that time.  She has no cardiac symptoms. Last year she was unable to get lot of attention to her own health because of her mother's advanced age and health problems. She is now becoming more involved in taking care of herself. I lipid profile performed last October showed that her LDL cholesterol was severely elevated at 157 despite statin therapy. We added Zetia and she has tolerated this well.  Her mother will be living with her and Alyssa Navarro to the spring. In February she plans a seven-day mission trip to [place]. She was very kind and gave me a bookmark memento of her trip to [place].     Allergies  Allergen Reactions  . Avelox [Moxifloxacin Hcl In Nacl]   . Statins     Myalgias   . Latex Rash  . Sulfa Antibiotics Rash    Current Outpatient Prescriptions  Medication Sig Dispense Refill  . amLODipine (NORVASC) 10 MG tablet Take 10 mg by mouth daily.      Marland Kitchen atorvastatin (LIPITOR) 20 MG tablet TAKE ONE TABLET BY MOUTH ONCE DAILY  30 tablet  9  . Calcium-Magnesium-Zinc (CAL-MAG-ZINC PO) Take 2-3 tablets by mouth daily.       . cholecalciferol (VITAMIN D) 1000 UNITS tablet Take 2,000 Units by mouth daily.      . Coenzyme Q10 (CO Q10) 100 MG CAPS Take 100 mg by mouth daily.      Marland Kitchen ezetimibe (ZETIA) 10 MG tablet Take 1 tablet (10 mg total) by mouth daily.  30 tablet  5  . KRILL OIL PO Take 1 tablet by mouth daily.      . metoprolol succinate (TOPROL-XL) 25 MG 24 hr tablet Take 12.5 mg by mouth daily.      Marland Kitchen VIVELLE-DOT 0.1  MG/24HR patch APPLY 1 PATCH TRANSDERMALLY TWICE A WEEK  24 patch  PRN   No current facility-administered medications for this visit.    Past Medical History  Diagnosis Date  . Coronary artery disease   . Hyperlipidemia   . Hypertension     Past Surgical History  Procedure Laterality Date  . Cardiac catheterization  02/12/1991    CAD demonstrated in Proximal LAD-50% stenosis, Distal LAD-60%, Mid Circumflex-60% stenosis, Marginal Circumflex-80% stenosis, Proximal RCA-70% stenosis  . Cardiac catheterization  11/12/1993    Mild-moderate 3-vessel disease-50% mid LAD, 75% mid circumflex, 80% ostial portion L circumflex.  . Cardiac catheterization  12/03/1996    95% mid circumflex stenosis stented with a 3.5x74mm Sci-Med NIR, resulting in reduciton to 0%. 75% distal circumflex stenosis stented with a 3.0x65mm NIR stent resulting in reduciton to 0%.  . Cardiac catheterization  08/23/2003    Continued medical therapy. Patent L circumflex stents placed in 1998.  Marland Kitchen Lexiscan myoview  12/22/2007    No ECG changes, nondiagnostic EKG. Perfusion defect in inferior myocardial region consistent with diaphragmatic attenuation.  . 2d echocardiogram  12/22/2007    EF >55%, normal.    Family History  Problem Relation Age of Onset  . Diabetes Mother   . Hypertension Mother   .  Hyperlipidemia Mother   . Heart failure Other   . Hypertension Other   . Diabetes Other     History   Social History  . Marital Status: Divorced    Spouse Name: N/A    Number of Children: N/A  . Years of Education: N/A   Occupational History  . Not on file.   Social History Main Topics  . Smoking status: Never Smoker   . Smokeless tobacco: Not on file  . Alcohol Use: No  . Drug Use: No  . Sexual Activity: Not on file   Other Topics Concern  . Not on file   Social History Narrative  . No narrative on file    Review of systems: The patient specifically denies any chest pain at rest or with exertion, dyspnea  at rest or with exertion, orthopnea, paroxysmal nocturnal dyspnea, syncope, palpitations, focal neurological deficits, intermittent claudication, lower extremity edema, unexplained weight gain, cough, hemoptysis or wheezing.  The patient also denies abdominal pain, nausea, vomiting, dysphagia, diarrhea, constipation, polyuria, polydipsia, dysuria, hematuria, frequency, urgency, abnormal bleeding or bruising, fever, chills, unexpected weight changes, mood swings, change in skin or hair texture, change in voice quality, auditory or visual problems, allergic reactions or rashes, new musculoskeletal complaints other than usual "aches and pains".   PHYSICAL EXAM BP 140/88  Pulse 72  Resp 16  Ht 5\' 1"  (1.549 m)  Wt 136 lb 8 oz (61.916 kg)  BMI 25.80 kg/m2 General: Alert, oriented x3, no distress  Head: no evidence of trauma, PERRL, EOMI, no exophtalmos or lid lag, no myxedema, no xanthelasma; normal ears, nose and oropharynx  Neck: normal jugular venous pulsations and no hepatojugular reflux; brisk carotid pulses without delay and no carotid bruits  Chest: clear to auscultation, no signs of consolidation by percussion or palpation, normal fremitus, symmetrical and full respiratory excursions  Cardiovascular: normal position and quality of the apical impulse, regular rhythm, normal first and second heart sounds, no murmurs, rubs or gallops  Abdomen: no tenderness or distention, no masses by palpation, no abnormal pulsatility or arterial bruits, normal bowel sounds, no hepatosplenomegaly  Extremities: no clubbing, cyanosis or edema; 2+ radial, ulnar and brachial pulses bilaterally; 2+ right femoral, posterior tibial and dorsalis pedis pulses; 2+ left femoral, posterior tibial and dorsalis pedis pulses; no subclavian or femoral bruits  Neurological: grossly nonfocal   EKG: NSR, normal  Lipid Panel     Component Value Date/Time   CHOL 236* 11/10/2012 0956   TRIG 107 11/10/2012 0956   HDL 58 11/10/2012  0956   CHOLHDL 4.1 11/10/2012 0956   VLDL 21 11/10/2012 0956   LDLCALC 157* 11/10/2012 0956    BMET    Component Value Date/Time   NA 141 11/10/2012 0956   K 3.9 11/10/2012 0956   CL 105 11/10/2012 0956   CO2 30 11/10/2012 0956   GLUCOSE 88 11/10/2012 0956   BUN 12 11/10/2012 0956   CREATININE 0.65 11/10/2012 0956   CALCIUM 9.6 11/10/2012 0956     ASSESSMENT AND PLAN No problem-specific assessment & plan notes found for this encounter.  No orders of the defined types were placed in this encounter.   No orders of the defined types were placed in this encounter.    Holli Humbles, MD, [place] [phone] office [phone] pager

## 2013-02-12 NOTE — Assessment & Plan Note (Signed)
Asymptomatic. The focus is on risk factor modification.

## 2013-03-01 ENCOUNTER — Other Ambulatory Visit: Payer: Self-pay | Admitting: Cardiovascular Disease

## 2013-06-23 ENCOUNTER — Ambulatory Visit (INDEPENDENT_AMBULATORY_CARE_PROVIDER_SITE_OTHER): Payer: Medicare Other | Admitting: Obstetrics and Gynecology

## 2013-06-23 ENCOUNTER — Encounter: Payer: Self-pay | Admitting: Obstetrics and Gynecology

## 2013-06-23 ENCOUNTER — Other Ambulatory Visit (HOSPITAL_COMMUNITY)
Admission: RE | Admit: 2013-06-23 | Discharge: 2013-06-23 | Disposition: A | Discharge status: Home / Self Care | Payer: BC Managed Care – PPO | Source: Ambulatory Visit | Attending: Obstetrics and Gynecology | Admitting: Obstetrics and Gynecology

## 2013-06-23 VITALS — BP 130/82 | Ht 61.0 in | Wt 133.0 lb

## 2013-06-23 DIAGNOSIS — Z124 Encounter for screening for malignant neoplasm of cervix: Secondary | ICD-10-CM | POA: Insufficient documentation

## 2013-06-23 DIAGNOSIS — Z1151 Encounter for screening for human papillomavirus (HPV): Secondary | ICD-10-CM | POA: Insufficient documentation

## 2013-06-23 DIAGNOSIS — Z01419 Encounter for gynecological examination (general) (routine) without abnormal findings: Secondary | ICD-10-CM | POA: Insufficient documentation

## 2013-06-23 DIAGNOSIS — Z0000 Encounter for general adult medical examination without abnormal findings: Secondary | ICD-10-CM

## 2013-06-23 HISTORY — DX: Encounter for screening for malignant neoplasm of cervix: Z12.4

## 2013-06-23 MED ORDER — ESTRADIOL 0.1 MG/24HR TD PTTW: MEDICATED_PATCH | TRANSDERMAL | Status: DC

## 2013-06-23 NOTE — Progress Notes (Signed)
Patient ID: Alyssa Navarro, female   DOB: 02/06/1950, 63 y.o.   MRN: [id number]  Assessment:  Annual Gyn Exam CAD Hyperlipidemia Atrophic vaginitis   Plan:  1. pap smear done, next pap due none in future 2. return annually or prn 3    Annual mammogram advised Subjective:  Alyssa Navarro is a 63 y.o. female No obstetric history on file. who presents for annual exam. No LMP recorded. Patient has had a hysterectomy. last pap 09/25/11 cervix removed at hyst The patient has complaints today of none. Using a vivelle dot 0.1 mg for a week at a time. Will consider cutting in half and splitting into biweekly replacement..  The following portions of the patient's history were reviewed and updated as appropriate: allergies, current medications, past family history, past medical history, past social history, past surgical history and problem list.  Review of Systems Constitutional: negative Gastrointestinal: negative Genitourinary: normal , doing fine.  Objective:  BP 130/82  Ht 5\' 1"  (1.549 m)  Wt 133 lb (60.328 kg)  BMI 25.14 kg/m2   BMI: Body mass index is 25.14 kg/(m^2).  General Appearance: Alert, appropriate appearance for age. No acute distress HEENT: Grossly normal Neck / Thyroid:  Cardiovascular: RRR; normal S1, S2, no murmur Lungs: CTA bilaterally Back: No CVAT Breast Exam: No dimpling, nipple retraction or discharge. No masses or nodes., bilateral fibrocystic changes, right breast has more fibrocystic changes than left and No masses or nodes.No dimpling, nipple retraction or discharge. Gastrointestinal: Soft, non-tender, no masses or organomegaly Pelvic Exam: Vulva and vagina appear normal. Bimanual exam reveals normal uterus and adnexa. Vaginal: normal mucosa without prolapse or lesions Adnexa: normal bimanual exam Uterus: absent Rectal: good sphincter tone and guaiac negative Rectovaginal: normal rectal, no masses Lymphatic Exam: Non-palpable nodes in neck, clavicular,  axillary, or inguinal regions Skin: no rash or abnormalities Neurologic: Normal gait and speech, no tremor  Psychiatric: Alert and oriented, appropriate affect.  Urinalysis:Not done  Mallory Shirk. MD Pgr [phone] 9:14 AM

## 2013-06-23 NOTE — Patient Instructions (Signed)
Continue dry vulva regimen, with blow dryer.

## 2013-06-30 ENCOUNTER — Other Ambulatory Visit: Payer: Self-pay | Admitting: Obstetrics and Gynecology

## 2013-06-30 MED ORDER — METRONIDAZOLE 500 MG PO TABS: 500.0000 mg | ORAL_TABLET | Freq: Two times a day (BID) | ORAL | Status: DC

## 2013-06-30 NOTE — Progress Notes (Signed)
Pt informed of Positive trich and of medication being called in to the pharmacy with 1 refill. Pt also advised to get partner treated as well. Pt transferred up front to make a follow up appointment for POC per JVF.

## 2013-07-02 DIAGNOSIS — I10 Essential (primary) hypertension: Secondary | ICD-10-CM | POA: Diagnosis not present

## 2013-07-02 DIAGNOSIS — E069 Thyroiditis, unspecified: Secondary | ICD-10-CM | POA: Diagnosis not present

## 2013-07-02 DIAGNOSIS — I259 Chronic ischemic heart disease, unspecified: Secondary | ICD-10-CM | POA: Diagnosis not present

## 2013-07-13 ENCOUNTER — Telehealth: Payer: Self-pay | Admitting: *Deleted

## 2013-07-13 MED ORDER — METOPROLOL TARTRATE 25 MG PO TABS: 25.0000 mg | ORAL_TABLET | Freq: Two times a day (BID) | ORAL | Status: DC

## 2013-07-13 NOTE — Telephone Encounter (Signed)
Message copied by Doyne Keel on Tue Jul 13, 2013 10:06 AM ------      Message from: Jonnie Kind      Created: Mon Jun 28, 2013  7:30 PM       Trichomonas noted on pap. Not appreciated at time of office visit. Will require treating , along with partner, and followup wet prep for proof of cure. I will E-scribe metronidazole x 7 d., refil x 1 ------

## 2013-07-13 NOTE — Telephone Encounter (Signed)
Insurance will not cover Toprol XL 50mg .  Per Dr. Loletha Grayer switch to Metoprolol Tart 25mg  bid.  Patient notified and voiced understanding.  New Rx sent to pharmacy for 90 day supply.

## 2013-07-13 NOTE — Telephone Encounter (Signed)
Message copied by Doyne Keel on Tue Jul 13, 2013 10:14 AM ------      Message from: Jonnie Kind      Created: Mon Jun 28, 2013  7:30 PM       Trichomonas noted on pap. Not appreciated at time of office visit. Will require treating , along with partner, and followup wet prep for proof of cure. I will E-scribe metronidazole x 7 d., refil x 1 ------

## 2013-07-13 NOTE — Telephone Encounter (Signed)
Pt informed Trich showing on pap, has received metronidazole, appt for proof of cure 07/09/2013.

## 2013-07-19 ENCOUNTER — Encounter: Payer: Self-pay | Admitting: Obstetrics and Gynecology

## 2013-07-19 ENCOUNTER — Ambulatory Visit (INDEPENDENT_AMBULATORY_CARE_PROVIDER_SITE_OTHER): Payer: Medicare Other | Admitting: Obstetrics and Gynecology

## 2013-07-19 VITALS — BP 112/66 | Ht 60.0 in | Wt 133.0 lb

## 2013-07-19 DIAGNOSIS — A599 Trichomoniasis, unspecified: Secondary | ICD-10-CM | POA: Diagnosis not present

## 2013-07-19 HISTORY — DX: Trichomoniasis, unspecified: A59.9

## 2013-07-19 LAB — POCT WET PREP WITH KOH
Bacteria Wet Prep HPF POC: NORMAL
EPITHELIAL WET PREP PER HPF POC: NORMAL
KOH Prep POC: NEGATIVE
RBC Wet Prep HPF POC: NORMAL
TRICHOMONAS UA: NEGATIVE
WBC Wet Prep HPF POC: NORMAL

## 2013-07-19 NOTE — Progress Notes (Signed)
This chart was scribed by Ludger Nutting, Medical Scribe, for Dr. Mallory Shirk on 07/19/13 at 9:29 AM. This chart was reviewed by Dr. Mallory Shirk for accuracy.   [facility] Visit  Patient name: Alyssa Navarro MRN [id number]  Date of birth: 1950-04-09  CC & HPI:  Alyssa Navarro is a 63 y.o. female presenting today for proof of treatment. She was prescribed metronidazole for positive trich on 5/25.    ROS:  Partner has been Tx'd  Pertinent History Reviewed:   Reviewed: Significant for - Medical                           Surgical Hx:    Medications: Reviewed & Updated - see associated section Social History: Reviewed -  reports that she has never smoked. She has never used smokeless tobacco.  Objective Findings:  Vitals: Blood pressure 112/66, height 5' (1.524 m), weight 133 lb (60.328 kg).  Physical Examination: General appearance - alert, well appearing, and in no distress and oriented to person, place, and time Mental status - alert, oriented to person, place, and time Pelvic - VULVA: normal appearing vulva with no masses, tenderness or lesions,  VAGINA: normal appearing vagina with normal color and discharge, no lesions, CERVIX: normal appearing cervix without discharge or lesions,  UTERUS: ,  ADNEXA:  Wet prep - for trich, yeast, wbc  Assessment & Plan:   A: 1. Trichomoniasis, resolved  P: 1. PRN or 1 year

## 2013-09-24 ENCOUNTER — Telehealth: Payer: Self-pay | Admitting: Cardiovascular Disease

## 2013-09-24 MED ORDER — METOPROLOL SUCCINATE ER 25 MG PO TB24
25.0000 mg | ORAL_TABLET | Freq: Every day | ORAL | Status: DC
Start: 2013-09-24 — End: 2014-10-02

## 2013-09-24 NOTE — Telephone Encounter (Signed)
Please call,having some side effects from her Metoprolol.

## 2013-09-24 NOTE — Telephone Encounter (Signed)
Spoke with pt, she switched to lopressor because her insurance would not cover toprol. Since switching her bp is going up about 4-5 hours after taking, today 150/92. She is nauseated, has a dull headache and her vision is blurred. This did not start until she switched meds. She would like to try the generic toprol xl. New script called into the pharm, she will call if issues continue after switching back.

## 2013-11-18 ENCOUNTER — Other Ambulatory Visit: Payer: Self-pay | Admitting: Obstetrics and Gynecology

## 2013-11-18 DIAGNOSIS — Z23 Encounter for immunization: Secondary | ICD-10-CM | POA: Diagnosis not present

## 2013-11-18 DIAGNOSIS — Z1231 Encounter for screening mammogram for malignant neoplasm of breast: Secondary | ICD-10-CM

## 2013-11-19 ENCOUNTER — Ambulatory Visit (HOSPITAL_COMMUNITY)
Admission: RE | Admit: 2013-11-19 | Discharge: 2013-11-19 | Disposition: A | Discharge status: Home / Self Care | Payer: Medicare Other | Source: Ambulatory Visit | Attending: Obstetrics and Gynecology | Admitting: Obstetrics and Gynecology

## 2013-11-19 DIAGNOSIS — Z1231 Encounter for screening mammogram for malignant neoplasm of breast: Secondary | ICD-10-CM | POA: Insufficient documentation

## 2014-01-19 ENCOUNTER — Other Ambulatory Visit: Payer: Self-pay | Admitting: Cardiovascular Disease

## 2014-03-17 ENCOUNTER — Other Ambulatory Visit: Payer: Self-pay | Admitting: Cardiovascular Disease

## 2014-03-17 NOTE — Telephone Encounter (Signed)
Rx has been sent to the pharmacy electronically. ° °

## 2014-04-11 ENCOUNTER — Other Ambulatory Visit: Payer: Self-pay | Admitting: Cardiovascular Disease

## 2014-04-11 NOTE — Telephone Encounter (Signed)
Rx(s) sent to pharmacy electronically.  

## 2014-05-08 ENCOUNTER — Other Ambulatory Visit: Payer: Self-pay | Admitting: Cardiovascular Disease

## 2014-05-09 NOTE — Telephone Encounter (Signed)
Rx has been sent to the pharmacy electronically. ° °

## 2014-05-18 ENCOUNTER — Telehealth: Payer: Self-pay | Admitting: Obstetrics and Gynecology

## 2014-05-19 ENCOUNTER — Other Ambulatory Visit: Payer: Self-pay | Admitting: Cardiovascular Disease

## 2014-05-19 NOTE — Telephone Encounter (Signed)
E-SENT MEDICATIONS X 2 #30 DAY

## 2014-05-24 MED ORDER — ESTRADIOL 0.05 MG/24HR TD PTTW: 1.0000 | MEDICATED_PATCH | TRANSDERMAL | Status: DC

## 2014-05-24 NOTE — Telephone Encounter (Signed)
refil vivelle dot at lower dosing 0.05 x 1 yr

## 2014-07-08 ENCOUNTER — Ambulatory Visit (INDEPENDENT_AMBULATORY_CARE_PROVIDER_SITE_OTHER): Payer: Medicare Other | Admitting: Cardiovascular Disease

## 2014-07-08 DIAGNOSIS — E785 Hyperlipidemia, unspecified: Secondary | ICD-10-CM | POA: Diagnosis not present

## 2014-07-10 NOTE — Progress Notes (Signed)
Patient ID: Alyssa Navarro, female   DOB: 12-24-1950, 64 y.o.   MRN: [id number] Error. Rescheduled

## 2014-07-17 ENCOUNTER — Other Ambulatory Visit: Payer: Self-pay | Admitting: Cardiovascular Disease

## 2014-07-18 NOTE — Telephone Encounter (Signed)
E sent to pharmacy 

## 2014-07-28 DIAGNOSIS — L255 Unspecified contact dermatitis due to plants, except food: Secondary | ICD-10-CM | POA: Diagnosis not present

## 2014-08-12 ENCOUNTER — Ambulatory Visit (INDEPENDENT_AMBULATORY_CARE_PROVIDER_SITE_OTHER): Payer: Medicare Other | Admitting: Cardiovascular Disease

## 2014-08-12 ENCOUNTER — Encounter: Payer: Self-pay | Admitting: Cardiovascular Disease

## 2014-08-12 VITALS — BP 128/74 | HR 77 | Ht 61.0 in | Wt 137.0 lb

## 2014-08-12 DIAGNOSIS — Z79899 Other long term (current) drug therapy: Secondary | ICD-10-CM

## 2014-08-12 DIAGNOSIS — I10 Essential (primary) hypertension: Secondary | ICD-10-CM | POA: Diagnosis not present

## 2014-08-12 DIAGNOSIS — I2510 Atherosclerotic heart disease of native coronary artery without angina pectoris: Secondary | ICD-10-CM | POA: Diagnosis not present

## 2014-08-12 DIAGNOSIS — E785 Hyperlipidemia, unspecified: Secondary | ICD-10-CM | POA: Diagnosis not present

## 2014-08-12 NOTE — Patient Instructions (Signed)
Your physician recommends that you return for lab work in: FASTING AT [facility] AT YOUR CONVENIENCE  Dr. Croitoru recommends that you schedule a follow-up appointment in: ONE YEAR    

## 2014-08-13 NOTE — Progress Notes (Signed)
Patient ID: Alyssa Navarro, female   DOB: October 21, 1950, 64 y.o.   MRN: [id number]     Cardiology Office Note   Date:  08/13/2014   ID:  SHANICKA OLDENKAMP, DOB [birth year], MRN [id number]  PCP:  Alonza Bogus, MD  Cardiologist:   Sanda [facility], MD   Chief Complaint  Patient presents with  . 18 MONTHS    Patient has no complaints.      History of Present Illness: Alyssa Navarro is a 64 y.o. female who presents for  Routine follow-up for coronary artery disease with remote history of revascularization procedures in the 1990s. She feels great and despite an active lifestyle has no complaints of angina or dyspnea. As always she is mostly consumed with taking care of her 69 year old mother. She has successfully lost weight and is now only minimally above ideal BMI. Overall she "feels great". She had to stop zetia due to cost issues.  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 She required percutaneous revascularization in 1998 (3.5x25 NIR to mid dominant left circumflex artery, 3.0x16 NIR to distal LCX, 50-60% mid LAD untreated), but no further procedure since that time.  Past Medical History  Diagnosis Date  . Coronary artery disease   . Hyperlipidemia   . Hypertension     Past Surgical History  Procedure Laterality Date  . Cardiac catheterization  02/12/1991    CAD demonstrated in Proximal LAD-50% stenosis, Distal LAD-60%, Mid Circumflex-60% stenosis, Marginal Circumflex-80% stenosis, Proximal RCA-70% stenosis  . Cardiac catheterization  11/12/1993    Mild-moderate 3-vessel disease-50% mid LAD, 75% mid circumflex, 80% ostial portion L circumflex.  . Cardiac catheterization  12/03/1996    95% mid circumflex stenosis stented with a 3.5x52mm Sci-Med NIR, resulting in reduciton to 0%. 75% distal circumflex stenosis stented with a 3.0x102mm NIR stent resulting in reduciton to 0%.  . Cardiac catheterization   08/23/2003    Continued medical therapy. Patent L circumflex stents placed in 1998.  Marland Kitchen Lexiscan myoview  12/22/2007    No ECG changes, nondiagnostic EKG. Perfusion defect in inferior myocardial region consistent with diaphragmatic attenuation.  . 2d echocardiogram  12/22/2007    EF >55%, normal.     Current Outpatient Prescriptions  Medication Sig Dispense Refill  . amLODipine (NORVASC) 10 MG tablet Take 10 mg by mouth daily.    Marland Kitchen atorvastatin (LIPITOR) 20 MG tablet TAKE 1 TABLET BY MOUTH DAILY 90 tablet 2  . Calcium-Magnesium-Zinc (CAL-MAG-ZINC PO) Take 2-3 tablets by mouth daily.     . cholecalciferol (VITAMIN D) 1000 UNITS tablet Take 2,000 Units by mouth daily.    . Coenzyme Q10 (CO Q10) 100 MG CAPS Take 100 mg by mouth daily.    Marland Kitchen estradiol (VIVELLE-DOT) 0.05 MG/24HR patch Place 1 patch (0.05 mg total) onto the skin 2 (two) times a week. 8 patch 12  . KRILL OIL PO Take 1 tablet by mouth daily.    . metoprolol succinate (TOPROL XL) 25 MG 24 hr tablet Take 1 tablet (25 mg total) by mouth daily. 30 tablet 12   No current facility-administered medications for this visit.    Allergies:   Avelox; Statins; Latex; and Sulfa antibiotics    Social History:  The patient  reports that she has never smoked. She has never used smokeless tobacco. She reports that she does not drink alcohol or use illicit drugs.   Family History:  The patient's family history includes Cancer in  her maternal aunt and maternal grandmother; Diabetes in her maternal grandmother, mother, and other; Fibroids in her sister; Heart disease in her sister; Heart failure in her other and paternal grandmother; Hyperlipidemia in her father and mother; Hypertension in her mother, other, and paternal grandmother.    ROS:  Please see the history of present illness.    Otherwise, review of systems positive for none.   All other systems are reviewed and negative.    PHYSICAL EXAM: VS:  BP 128/74 mmHg  Pulse 77  Ht 5\' 1"   (1.549 m)  Wt 137 lb (62.143 kg)  BMI 25.90 kg/m2 , BMI Body mass index is 25.9 kg/(m^2).  General: Alert, oriented x3, no distress Head: no evidence of trauma, PERRL, EOMI, no exophtalmos or lid lag, no myxedema, no xanthelasma; normal ears, nose and oropharynx Neck: normal jugular venous pulsations and no hepatojugular reflux; brisk carotid pulses without delay and no carotid bruits Chest: clear to auscultation, no signs of consolidation by percussion or palpation, normal fremitus, symmetrical and full respiratory excursions Cardiovascular: normal position and quality of the apical impulse, regular rhythm, normal first and second heart sounds, no   murmurs, rubs or gallops Abdomen: no tenderness or distention, no masses by palpation, no abnormal pulsatility or arterial bruits, normal bowel sounds, no hepatosplenomegaly Extremities: no clubbing, cyanosis or edema; 2+ radial, ulnar and brachial pulses bilaterally; 2+ right femoral, posterior tibial and dorsalis pedis pulses; 2+ left femoral, posterior tibial and dorsalis pedis pulses; no subclavian or femoral bruits Neurological: grossly nonfocal Psych: euthymic mood, full affect   EKG:  EKG is ordered today. The ekg ordered today demonstrates  Normal sinus rhythm , chronic nonspecific repolarization changes with mild ST depression and T-wave flattening in leads V3-V4-V5 and in the inferior leads, QTC 436 ms   Recent Labs: No results found for requested labs within last 365 days.    Lipid Panel    Component Value Date/Time   CHOL 236* 11/10/2012 0956   TRIG 107 11/10/2012 0956   HDL 58 11/10/2012 0956   CHOLHDL 4.1 11/10/2012 0956   VLDL 21 11/10/2012 0956   LDLCALC 157* 11/10/2012 0956      Wt Readings from Last 3 Encounters:  08/12/14 137 lb (62.143 kg)  07/19/13 133 lb (60.328 kg)  06/23/13 133 lb (60.328 kg)       ASSESSMENT AND PLAN:  1. CAD - asymptomatic 2. Hyperlipidemia on statin, off zetia. Recheck lipids. At  least would increase atorvastatin dose, depending on results 3. HTN, well controlled   Current medicines are reviewed at length with the patient today.  The patient does not have concerns regarding medicines.  The following changes have been made:  no change  Labs/ tests ordered today include:  Orders Placed This Encounter  Procedures  . Comprehensive metabolic panel  . Lipid panel  . EKG 12-Lead   Patient Instructions  Your physician recommends that you return for lab work in: [facility].  Dr. Sallyanne Kuster recommends that you schedule a follow-up appointment in: ONE YEAR.        Mikael Spray, MD  08/13/2014 8:46 AM    Sanda [facility], MD, [facility] [phone] office [phone] pager

## 2014-08-15 ENCOUNTER — Other Ambulatory Visit: Payer: Self-pay | Admitting: Cardiovascular Disease

## 2014-09-07 DIAGNOSIS — E784 Other hyperlipidemia: Secondary | ICD-10-CM | POA: Diagnosis not present

## 2014-09-07 DIAGNOSIS — I2510 Atherosclerotic heart disease of native coronary artery without angina pectoris: Secondary | ICD-10-CM | POA: Diagnosis not present

## 2014-09-07 DIAGNOSIS — N951 Menopausal and female climacteric states: Secondary | ICD-10-CM | POA: Diagnosis not present

## 2014-09-07 DIAGNOSIS — I10 Essential (primary) hypertension: Secondary | ICD-10-CM | POA: Diagnosis not present

## 2014-09-08 ENCOUNTER — Encounter (INDEPENDENT_AMBULATORY_CARE_PROVIDER_SITE_OTHER): Payer: Self-pay | Admitting: *Deleted

## 2014-09-14 ENCOUNTER — Other Ambulatory Visit (INDEPENDENT_AMBULATORY_CARE_PROVIDER_SITE_OTHER): Payer: Self-pay | Admitting: *Deleted

## 2014-09-14 ENCOUNTER — Encounter (INDEPENDENT_AMBULATORY_CARE_PROVIDER_SITE_OTHER): Payer: Self-pay | Admitting: *Deleted

## 2014-09-14 DIAGNOSIS — Z1211 Encounter for screening for malignant neoplasm of colon: Secondary | ICD-10-CM

## 2014-09-16 DIAGNOSIS — E784 Other hyperlipidemia: Secondary | ICD-10-CM | POA: Diagnosis not present

## 2014-09-16 DIAGNOSIS — I2510 Atherosclerotic heart disease of native coronary artery without angina pectoris: Secondary | ICD-10-CM | POA: Diagnosis not present

## 2014-09-16 DIAGNOSIS — N951 Menopausal and female climacteric states: Secondary | ICD-10-CM | POA: Diagnosis not present

## 2014-09-16 DIAGNOSIS — Z79899 Other long term (current) drug therapy: Secondary | ICD-10-CM | POA: Diagnosis not present

## 2014-09-16 DIAGNOSIS — E785 Hyperlipidemia, unspecified: Secondary | ICD-10-CM | POA: Diagnosis not present

## 2014-09-16 DIAGNOSIS — I10 Essential (primary) hypertension: Secondary | ICD-10-CM | POA: Diagnosis not present

## 2014-09-17 LAB — COMPREHENSIVE METABOLIC PANEL
ALT: 27 U/L (ref 6–29)
AST: 18 U/L (ref 10–35)
Albumin: 3.8 g/dL (ref 3.6–5.1)
Alkaline Phosphatase: 87 U/L (ref 33–130)
BUN: 12 mg/dL (ref 7–25)
CO2: 27 mmol/L (ref 20–31)
CREATININE: 0.58 mg/dL (ref 0.50–0.99)
Calcium: 9 mg/dL (ref 8.6–10.4)
Chloride: 104 mmol/L (ref 98–110)
Glucose, Bld: 83 mg/dL (ref 65–99)
Potassium: 3.7 mmol/L (ref 3.5–5.3)
SODIUM: 142 mmol/L (ref 135–146)
Total Bilirubin: 0.7 mg/dL (ref 0.2–1.2)
Total Protein: 6.3 g/dL (ref 6.1–8.1)

## 2014-09-17 LAB — LIPID PANEL
Cholesterol: 224 mg/dL — ABNORMAL HIGH (ref 125–200)
HDL: 52 mg/dL (ref >=46)
LDL CALC: 137 mg/dL — AB (ref <130)
Total CHOL/HDL Ratio: 4.3 Ratio (ref <=5.0)
Triglycerides: 177 mg/dL — ABNORMAL HIGH (ref <150)
VLDL: 35 mg/dL — ABNORMAL HIGH (ref <30)

## 2014-09-19 ENCOUNTER — Telehealth: Payer: Self-pay | Admitting: *Deleted

## 2014-09-19 DIAGNOSIS — Z79899 Other long term (current) drug therapy: Secondary | ICD-10-CM

## 2014-09-19 MED ORDER — ATORVASTATIN CALCIUM 80 MG PO TABS: 80.0000 mg | ORAL_TABLET | Freq: Every day | ORAL | Status: DC

## 2014-09-19 NOTE — Telephone Encounter (Signed)
-----   Message from Sanda [facility], MD sent at 09/17/2014  5:16 PM EDT ----- Total and LDL cholesterol are very high off zetia. Recommend increasing the dos of atorvastatin to 80 mg daily and recheck in 3 months. Please note that there is no dose-side effect relationship with this medication (higher doses do not typically cause more side effects).

## 2014-09-19 NOTE — Telephone Encounter (Signed)
Lab results called to patient.  Will increase atorvastatin - she wants to gradually increase to the 80mg  so she will take 40mg  x 1 week then bump up to 80mg . New Rx sent to pharmacy.  Lab order mailed for recheck in 3 months.

## 2014-10-02 ENCOUNTER — Other Ambulatory Visit: Payer: Self-pay | Admitting: Cardiovascular Disease

## 2014-10-03 NOTE — Telephone Encounter (Signed)
REFILL 

## 2014-10-06 ENCOUNTER — Telehealth: Payer: Self-pay | Admitting: Cardiovascular Disease

## 2014-10-06 DIAGNOSIS — E785 Hyperlipidemia, unspecified: Secondary | ICD-10-CM

## 2014-10-06 MED ORDER — ATORVASTATIN CALCIUM 40 MG PO TABS: 40.0000 mg | ORAL_TABLET | Freq: Every day | ORAL | Status: DC

## 2014-10-06 NOTE — Telephone Encounter (Signed)
Atorvastatin 40 mg ordered # 30 with 6 refills  Lipid panel ordered for 3 months

## 2014-10-06 NOTE — Telephone Encounter (Signed)
Not interested in new drug or injection  She would like to go back to Atorvastatin 40 mg from 80 mg and check lipid profile in 3 months  She will go back to taking fish oil supplement

## 2014-10-06 NOTE — Telephone Encounter (Signed)
The problem with Zetia is not so much the abnormal LFTs , but rather that it is not effective enough to lower her cholesterol. With her permission, I will ask our pharmacist to see if she qualifies for one of the new cholesterol lowering drugs (not statins, no muscle side effects , but expensive and injectable ). If she does, we'll talk to her in more detail about whether or not these medicines are right for her.  for now okay to stop Lipitor and resume Zetia 10 mg daily

## 2014-10-06 NOTE — Telephone Encounter (Signed)
Patient said that she has been on Lipitor since 8/15  Is experiencing muscle pain causing poor sleep along with constipation  Stopped taking Lipitor August 28th  Would like to go back in Zetia 10mg ; said it was dcd last year because of elevated LFT's but would like to trial again and recheck LFT's when she has other blood work done in [place]  Symptoms are not as severe as when she was on Crestor several years ago.

## 2014-10-06 NOTE — Telephone Encounter (Signed)
Ok Atorvastatin 40 mg it is. Recheck 3 months

## 2014-10-06 NOTE — Telephone Encounter (Signed)
Please call,concerning her Atorvastatin 80 mg. She is having some side effects from it.

## 2014-11-10 ENCOUNTER — Telehealth (INDEPENDENT_AMBULATORY_CARE_PROVIDER_SITE_OTHER): Payer: Self-pay | Admitting: *Deleted

## 2014-11-10 DIAGNOSIS — Z23 Encounter for immunization: Secondary | ICD-10-CM | POA: Diagnosis not present

## 2014-11-10 DIAGNOSIS — Z1211 Encounter for screening for malignant neoplasm of colon: Secondary | ICD-10-CM

## 2014-11-10 NOTE — Telephone Encounter (Signed)
Patient needs trilyte 

## 2014-11-11 MED ORDER — PEG 3350-KCL-NA BICARB-NACL 420 G PO SOLR: 4000.0000 mL | Freq: Once | ORAL | Status: DC

## 2014-11-15 ENCOUNTER — Other Ambulatory Visit: Payer: Self-pay | Admitting: Obstetrics and Gynecology

## 2014-11-15 DIAGNOSIS — Z1231 Encounter for screening mammogram for malignant neoplasm of breast: Secondary | ICD-10-CM

## 2014-11-23 ENCOUNTER — Other Ambulatory Visit (HOSPITAL_COMMUNITY): Payer: Self-pay | Admitting: Cardiology

## 2014-11-23 ENCOUNTER — Ambulatory Visit (HOSPITAL_COMMUNITY)
Admission: RE | Admit: 2014-11-23 | Discharge: 2014-11-23 | Disposition: A | Discharge status: Home / Self Care | Payer: Medicare Other | Source: Ambulatory Visit | Attending: Cardiology | Admitting: Cardiology

## 2014-11-23 ENCOUNTER — Other Ambulatory Visit: Payer: Self-pay | Admitting: Obstetrics and Gynecology

## 2014-11-23 ENCOUNTER — Ambulatory Visit (HOSPITAL_COMMUNITY): Payer: Medicare Other

## 2014-11-23 DIAGNOSIS — Z1231 Encounter for screening mammogram for malignant neoplasm of breast: Secondary | ICD-10-CM

## 2014-11-28 ENCOUNTER — Telehealth (INDEPENDENT_AMBULATORY_CARE_PROVIDER_SITE_OTHER): Payer: Self-pay | Admitting: *Deleted

## 2014-11-28 NOTE — Telephone Encounter (Signed)
Referring MD/PCP: hawkins   Procedure: tcs  Reason/Indication:  screening  Has patient had this procedure before?  Yes, 2001  If so, when, by whom and where?    Is there a family history of colon cancer?  no  Who?  What age when diagnosed?    Is patient diabetic?   no      Does patient have prosthetic heart valve?  no  Do you have a pacemaker?  no  Has patient ever had endocarditis? no  Has patient had joint replacement within last 12 months?  no  Does patient tend to be constipated or take laxatives? no  Does patient have a history of alcohol/drug use? no  Is patient on Coumadin, Plavix and/or Aspirin? yes  Medications: asa 81 mg randomly, advil prn, vivelle dot 0.05 mg twice weekly, lipitor 20 mg daily, metoprolol 25 mg daily, amlodipine 10 mg daily, vit d 1000 iu tid, co q 10 100 mg bid, magnesium/calclium/zinc tid  Allergies: sulfur, latex, statins, avelox  Medication Adjustment: asa 2 days  Procedure date & time: 12/21/14 at 830

## 2014-11-29 NOTE — Telephone Encounter (Signed)
Agree 

## 2014-12-21 ENCOUNTER — Encounter (HOSPITAL_COMMUNITY)
Admission: RE | Disposition: A | Discharge status: Home / Self Care | Payer: Self-pay | Source: Ambulatory Visit | Attending: Internal Medicine

## 2014-12-21 ENCOUNTER — Ambulatory Visit (HOSPITAL_COMMUNITY)
Admission: RE | Admit: 2014-12-21 | Discharge: 2014-12-21 | Disposition: A | Discharge status: Home / Self Care | Payer: Medicare Other | Source: Ambulatory Visit | Attending: Internal Medicine | Admitting: Internal Medicine

## 2014-12-21 ENCOUNTER — Encounter (HOSPITAL_COMMUNITY): Payer: Self-pay | Admitting: *Deleted

## 2014-12-21 DIAGNOSIS — Z8249 Family history of ischemic heart disease and other diseases of the circulatory system: Secondary | ICD-10-CM | POA: Insufficient documentation

## 2014-12-21 DIAGNOSIS — D123 Benign neoplasm of transverse colon: Secondary | ICD-10-CM | POA: Diagnosis not present

## 2014-12-21 DIAGNOSIS — Z1211 Encounter for screening for malignant neoplasm of colon: Secondary | ICD-10-CM

## 2014-12-21 DIAGNOSIS — Z7982 Long term (current) use of aspirin: Secondary | ICD-10-CM | POA: Diagnosis not present

## 2014-12-21 DIAGNOSIS — D122 Benign neoplasm of ascending colon: Secondary | ICD-10-CM | POA: Diagnosis not present

## 2014-12-21 DIAGNOSIS — Z955 Presence of coronary angioplasty implant and graft: Secondary | ICD-10-CM | POA: Diagnosis not present

## 2014-12-21 DIAGNOSIS — I2510 Atherosclerotic heart disease of native coronary artery without angina pectoris: Secondary | ICD-10-CM | POA: Diagnosis not present

## 2014-12-21 DIAGNOSIS — Z882 Allergy status to sulfonamides status: Secondary | ICD-10-CM | POA: Diagnosis not present

## 2014-12-21 DIAGNOSIS — Z79899 Other long term (current) drug therapy: Secondary | ICD-10-CM | POA: Diagnosis not present

## 2014-12-21 DIAGNOSIS — I10 Essential (primary) hypertension: Secondary | ICD-10-CM | POA: Diagnosis not present

## 2014-12-21 DIAGNOSIS — K648 Other hemorrhoids: Secondary | ICD-10-CM

## 2014-12-21 DIAGNOSIS — K5730 Diverticulosis of large intestine without perforation or abscess without bleeding: Secondary | ICD-10-CM | POA: Insufficient documentation

## 2014-12-21 DIAGNOSIS — Z90710 Acquired absence of both cervix and uterus: Secondary | ICD-10-CM | POA: Insufficient documentation

## 2014-12-21 DIAGNOSIS — Z888 Allergy status to other drugs, medicaments and biological substances status: Secondary | ICD-10-CM | POA: Insufficient documentation

## 2014-12-21 DIAGNOSIS — Z91040 Latex allergy status: Secondary | ICD-10-CM | POA: Diagnosis not present

## 2014-12-21 DIAGNOSIS — K644 Residual hemorrhoidal skin tags: Secondary | ICD-10-CM | POA: Diagnosis not present

## 2014-12-21 DIAGNOSIS — E785 Hyperlipidemia, unspecified: Secondary | ICD-10-CM | POA: Diagnosis not present

## 2014-12-21 HISTORY — PX: COLONOSCOPY: SHX5424

## 2014-12-21 LAB — HM COLONOSCOPY

## 2014-12-21 SURGERY — COLONOSCOPY
Anesthesia: Moderate Sedation

## 2014-12-21 MED ORDER — MEPERIDINE HCL 50 MG/ML IJ SOLN
INTRAMUSCULAR | Status: DC | PRN
  Administered 2014-12-21 (×2): 25 mg via INTRAVENOUS

## 2014-12-21 MED ORDER — MIDAZOLAM HCL 5 MG/5ML IJ SOLN
INTRAMUSCULAR | Status: DC | PRN
  Administered 2014-12-21 (×2): 2 mg via INTRAVENOUS

## 2014-12-21 MED ORDER — STERILE WATER FOR IRRIGATION IR SOLN
Status: DC | PRN
  Administered 2014-12-21: 09:00:00

## 2014-12-21 MED ORDER — SODIUM CHLORIDE 0.9 % IV SOLN: INTRAVENOUS | Status: DC

## 2014-12-21 NOTE — H&P (Signed)
Alyssa Navarro is an 64 y.o. female.   Chief Complaint: Patient is here for colonoscopy. HPI: Patient is 64 year old African-American female, tired iron was here for screening colonoscopy. She denies abdominal pain change in bowel habits or rectal bleeding. Last colonoscopy was in 2001. Family history is negative for CRC.  Past Medical History  Diagnosis Date  . Coronary artery disease   . Hyperlipidemia   . Hypertension     Past Surgical History  Procedure Laterality Date  . Cardiac catheterization  02/12/1991    CAD demonstrated in Proximal LAD-50% stenosis, Distal LAD-60%, Mid Circumflex-60% stenosis, Marginal Circumflex-80% stenosis, Proximal RCA-70% stenosis  . Cardiac catheterization  11/12/1993    Mild-moderate 3-vessel disease-50% mid LAD, 75% mid circumflex, 80% ostial portion L circumflex.  . Cardiac catheterization  12/03/1996    95% mid circumflex stenosis stented with a 3.5x85mm Sci-Med NIR, resulting in reduciton to 0%. 75% distal circumflex stenosis stented with a 3.0x67mm NIR stent resulting in reduciton to 0%.  . Cardiac catheterization  08/23/2003    Continued medical therapy. Patent L circumflex stents placed in 1998.  Marland Kitchen Lexiscan myoview  12/22/2007    No ECG changes, nondiagnostic EKG. Perfusion defect in inferior myocardial region consistent with diaphragmatic attenuation.  . 2d echocardiogram  12/22/2007    EF >55%, normal.  . Abdominal hysterectomy    . Arthroscopy with anterior cruciate ligament (acl) repair with anterior tibilias graft Right   . Cholecystectomy      Family History  Problem Relation Age of Onset  . Diabetes Mother   . Hypertension Mother   . Hyperlipidemia Mother   . Heart failure Other   . Hypertension Other   . Diabetes Other   . Hyperlipidemia Father   . Heart disease Sister   . Cancer Maternal Aunt     breast  . Cancer Maternal Grandmother     breast  . Diabetes Maternal Grandmother   . Hypertension Paternal Grandmother   .  Heart failure Paternal Grandmother   . Fibroids Sister    Social History:  reports that she has never smoked. She has never used smokeless tobacco. She reports that she does not drink alcohol or use illicit drugs.  Allergies:  Allergies  Allergen Reactions  . Avelox [Moxifloxacin Hcl In Nacl]   . Quinolones     Hallucinations, sleeplessness, rash  . Statins     Myalgias (high doses only)  . Latex Rash  . Sulfa Antibiotics Rash    Medications Prior to Admission  Medication Sig Dispense Refill  . amLODipine (NORVASC) 10 MG tablet Take 10 mg by mouth daily.    Marland Kitchen aspirin EC 81 MG tablet Take 81 mg by mouth 2 (two) times a week.    Marland Kitchen atorvastatin (LIPITOR) 40 MG tablet Take 1 tablet (40 mg total) by mouth daily. 30 tablet 6  . Calcium-Magnesium-Zinc (CAL-MAG-ZINC PO) Take 2 tablets by mouth every other day.     . cholecalciferol (VITAMIN D) 1000 UNITS tablet Take 2,000-3,000 Units by mouth daily.     . Coenzyme Q10 (CO Q10) 100 MG CAPS Take 100-200 mg by mouth daily.     Marland Kitchen estradiol (VIVELLE-DOT) 0.05 MG/24HR patch Place 1 patch (0.05 mg total) onto the skin 2 (two) times a week. 8 patch 12  . KRILL OIL PO Take 1 tablet by mouth daily.    . metoprolol succinate (TOPROL-XL) 25 MG 24 hr tablet TAKE 1 TABLET BY MOUTH EVERY DAY. 30 tablet 10  . Multiple Vitamin (  MULTIVITAMIN WITH MINERALS) TABS tablet Take 1 tablet by mouth daily.    . polyethylene glycol-electrolytes (NULYTELY/GOLYTELY) 420 G solution Take 4,000 mLs by mouth once. 4000 mL 0  . acetaminophen (TYLENOL) 325 MG tablet Take 650 mg by mouth every 6 (six) hours as needed for moderate pain.    Marland Kitchen ibuprofen (ADVIL,MOTRIN) 200 MG tablet Take 400 mg by mouth every 6 (six) hours as needed for moderate pain.      No results found for this or any previous visit (from the past 48 hour(s)). No results found.  ROS  Blood pressure 125/73, pulse 66, temperature 97.9 F (36.6 C), temperature source Oral, resp. rate 11, height 5\' 2"   (1.575 m), weight 130 lb (58.968 kg), SpO2 100 %. Physical Exam  Constitutional: She appears well-developed and well-nourished.  HENT:  Mouth/Throat: Oropharynx is clear and moist.  Eyes: Conjunctivae are normal. No scleral icterus.  Neck: No thyromegaly present.  Cardiovascular: Normal rate, regular rhythm and normal heart sounds.   No murmur heard. Respiratory: Effort normal and breath sounds normal.  GI: Soft. She exhibits no distension and no mass. There is no tenderness.  Musculoskeletal: She exhibits no edema.  Lymphadenopathy:    She has no cervical adenopathy.  Neurological: She is alert.  Skin: Skin is warm and dry.     Assessment/Plan Average risk screening colonoscopy.  Chung Chagoya U 12/21/2014, 8:43 AM

## 2014-12-21 NOTE — Discharge Instructions (Signed)
No aspirin or NSAIDs for 1 week. Resume other medications and high fiber diet as before. No driving for 24 hours. Physician will call with biopsy results.     Colonoscopy, Care After These instructions give you information on caring for yourself after your procedure. Your doctor may also give you more specific instructions. Call your doctor if you have any problems or questions after your procedure. HOME CARE  Do not drive for 24 hours.  Do not sign important papers or use machinery for 24 hours.  You may shower.  You may go back to your usual activities, but go slower for the first 24 hours.  Take rest breaks often during the first 24 hours.  Walk around or use warm packs on your belly (abdomen) if you have belly cramping or gas.  Drink enough fluids to keep your pee (urine) clear or pale yellow.  Resume your normal diet. Avoid heavy or fried foods.  Avoid drinking alcohol for 24 hours or as told by your doctor.  Only take medicines as told by your doctor. If a tissue sample (biopsy) was taken during the procedure:   Do not take aspirin or blood thinners for 7 days, or as told by your doctor.  Do not drink alcohol for 7 days, or as told by your doctor.  Eat soft foods for the first 24 hours. GET HELP IF: You still have a small amount of blood in your poop (stool) 2-3 days after the procedure. GET HELP RIGHT AWAY IF:  You have more than a small amount of blood in your poop.  You see clumps of tissue (blood clots) in your poop.  Your belly is puffy (swollen).  You feel sick to your stomach (nauseous) or throw up (vomit).  You have a fever.  You have belly pain that gets worse and medicine does not help. MAKE SURE YOU:  Understand these instructions.  Will watch your condition.  Will get help right away if you are not doing well or get worse.   This information is not intended to replace advice given to you by your health care provider. Make sure you discuss  any questions you have with your health care provider.   Document Released: 02/23/2010 Document Revised: 01/26/2013 Document Reviewed: 09/28/2012 Elsevier Interactive Patient Education [employer].   Diverticulosis Diverticulosis is the condition that develops when small pouches (diverticula) form in the wall of your colon. Your colon, or large intestine, is where water is absorbed and stool is formed. The pouches form when the inside layer of your colon pushes through weak spots in the outer layers of your colon. CAUSES  No one knows exactly what causes diverticulosis. RISK FACTORS  Being older than 9. Your risk for this condition increases with age. Diverticulosis is rare in people younger than 40 years. By age 83, almost everyone has it.  Eating a low-fiber diet.  Being frequently constipated.  Being overweight.  Not getting enough exercise.  Smoking.  Taking over-the-counter pain medicines, like aspirin and ibuprofen. SYMPTOMS  Most people with diverticulosis do not have symptoms. DIAGNOSIS  Because diverticulosis often has no symptoms, health care providers often discover the condition during an exam for other colon problems. In many cases, a health care provider will diagnose diverticulosis while using a flexible scope to examine the colon (colonoscopy). TREATMENT  If you have never developed an infection related to diverticulosis, you may not need treatment. If you have had an infection before, treatment may include:  Eating  more fruits, vegetables, and grains.  Taking a fiber supplement.  Taking a live bacteria supplement (probiotic).  Taking medicine to relax your colon. HOME CARE INSTRUCTIONS   Drink at least 6-8 glasses of water each day to prevent constipation.  Try not to strain when you have a bowel movement.  Keep all follow-up appointments. If you have had an infection before:  Increase the fiber in your diet as directed by your health care  provider or dietitian.  Take a dietary fiber supplement if your health care provider approves.  Only take medicines as directed by your health care provider. SEEK MEDICAL CARE IF:   You have abdominal pain.  You have bloating.  You have cramps.  You have not gone to the bathroom in 3 days. SEEK IMMEDIATE MEDICAL CARE IF:   Your pain gets worse.  Yourbloating becomes very bad.  You have a fever or chills, and your symptoms suddenly get worse.  You begin vomiting.  You have bowel movements that are bloody or black. MAKE SURE YOU:  Understand these instructions.  Will watch your condition.  Will get help right away if you are not doing well or get worse.   This information is not intended to replace advice given to you by your health care provider. Make sure you discuss any questions you have with your health care provider.   Document Released: 10/19/2003 Document Revised: 01/26/2013 Document Reviewed: 12/16/2012 Elsevier Interactive Patient Education 2016 Elsevier Inc.   High-Fiber Diet Fiber, also called dietary fiber, is a type of carbohydrate found in fruits, vegetables, whole grains, and beans. A high-fiber diet can have many health benefits. Your health care provider may recommend a high-fiber diet to help:  Prevent constipation. Fiber can make your bowel movements more regular.  Lower your cholesterol.  Relieve hemorrhoids, uncomplicated diverticulosis, or irritable bowel syndrome.  Prevent overeating as part of a weight-loss plan.  Prevent heart disease, type 2 diabetes, and certain cancers. WHAT IS MY PLAN? The recommended daily intake of fiber includes:  38 grams for men under age 95.  26 grams for men over age 78.  35 grams for women under age 34.  24 grams for women over age 72. You can get the recommended daily intake of dietary fiber by eating a variety of fruits, vegetables, grains, and beans. Your health care provider may also recommend a  fiber supplement if it is not possible to get enough fiber through your diet. WHAT DO I NEED TO KNOW ABOUT A HIGH-FIBER DIET?  Fiber supplements have not been widely studied for their effectiveness, so it is better to get fiber through food sources.  Always check the fiber content on thenutrition facts label of any prepackaged food. Look for foods that contain at least 5 grams of fiber per serving.  Ask your dietitian if you have questions about specific foods that are related to your condition, especially if those foods are not listed in the following section.  Increase your daily fiber consumption gradually. Increasing your intake of dietary fiber too quickly may cause bloating, cramping, or gas.  Drink plenty of water. Water helps you to digest fiber. WHAT FOODS CAN I EAT? Grains Whole-grain breads. Multigrain cereal. Oats and oatmeal. Brown rice. Barley. Bulgur wheat. [place]. Bran muffins. Popcorn. Rye wafer crackers. Vegetables Sweet potatoes. Spinach. Kale. Artichokes. Cabbage. Broccoli. Green peas. Carrots. Squash. Fruits Berries. Pears. Apples. Oranges. Avocados. Prunes and raisins. Dried figs. Meats and Other Protein Sources [place], kidney, pinto, and soy beans. Split  peas. Lentils. Nuts and seeds. Dairy Fiber-fortified yogurt. Beverages Fiber-fortified soy milk. Fiber-fortified orange juice. Other Fiber bars. The items listed above may not be a complete list of recommended foods or beverages. Contact your dietitian for more options. WHAT FOODS ARE NOT RECOMMENDED? Grains White bread. Pasta made with refined flour. White rice. Vegetables Fried potatoes. Canned vegetables. Well-cooked vegetables.  Fruits Fruit juice. Cooked, strained fruit. Meats and Other Protein Sources Fatty cuts of meat. Fried Sales executive or fried fish. Dairy Milk. Yogurt. Cream cheese. Sour cream. Beverages Soft drinks. Other Cakes and pastries. Butter and oils. The items listed above may not be a  complete list of foods and beverages to avoid. Contact your dietitian for more information. WHAT ARE SOME TIPS FOR INCLUDING HIGH-FIBER FOODS IN MY DIET?  Eat a wide variety of high-fiber foods.  Make sure that half of all grains consumed each day are whole grains.  Replace breads and cereals made from refined flour or white flour with whole-grain breads and cereals.  Replace white rice with brown rice, bulgur wheat, or millet.  Start the day with a breakfast that is high in fiber, such as a cereal that contains at least 5 grams of fiber per serving.  Use beans in place of meat in soups, salads, or pasta.  Eat high-fiber snacks, such as berries, raw vegetables, nuts, or popcorn.   This information is not intended to replace advice given to you by your health care provider. Make sure you discuss any questions you have with your health care provider.   Document Released: 01/21/2005 Document Revised: 02/11/2014 Document Reviewed: 07/06/2013 Elsevier Interactive Patient Education 2016 Elsevier Inc.   Colon Polyps Polyps are lumps of extra tissue growing inside the body. Polyps can grow in the large intestine (colon). Most colon polyps are noncancerous (benign). However, some colon polyps can become cancerous over time. Polyps that are larger than a pea may be harmful. To be safe, caregivers remove and test all polyps. CAUSES  Polyps form when mutations in the genes cause your cells to grow and divide even though no more tissue is needed. RISK FACTORS There are a number of risk factors that can increase your chances of getting colon polyps. They include:  Being older than 50 years.  Family history of colon polyps or colon cancer.  Long-term colon diseases, such as colitis or Crohn disease.  Being overweight.  Smoking.  Being inactive.  Drinking too much alcohol. SYMPTOMS  Most small polyps do not cause symptoms. If symptoms are present, they may include:  Blood in the stool.  The stool may look dark red or black.  Constipation or diarrhea that lasts longer than 1 week. DIAGNOSIS People often do not know they have polyps until their caregiver finds them during a regular checkup. Your caregiver can use 4 tests to check for polyps:  Digital rectal exam. The caregiver wears gloves and feels inside the rectum. This test would find polyps only in the rectum.  Barium enema. The caregiver puts a liquid called barium into your rectum before taking X-rays of your colon. Barium makes your colon look white. Polyps are dark, so they are easy to see in the X-ray pictures.  Sigmoidoscopy. A thin, flexible tube (sigmoidoscope) is placed into your rectum. The sigmoidoscope has a light and tiny camera in it. The caregiver uses the sigmoidoscope to look at the last third of your colon.  Colonoscopy. This test is like sigmoidoscopy, but the caregiver looks at the entire colon. This is  the most common method for finding and removing polyps. TREATMENT  Any polyps will be removed during a sigmoidoscopy or colonoscopy. The polyps are then tested for cancer. PREVENTION  To help lower your risk of getting more colon polyps:  Eat plenty of fruits and vegetables. Avoid eating fatty foods.  Do not smoke.  Avoid drinking alcohol.  Exercise every day.  Lose weight if recommended by your caregiver.  Eat plenty of calcium and folate. Foods that are rich in calcium include milk, cheese, and broccoli. Foods that are rich in folate include chickpeas, kidney beans, and spinach. HOME CARE INSTRUCTIONS Keep all follow-up appointments as directed by your caregiver. You may need periodic exams to check for polyps. SEEK MEDICAL CARE IF: You notice bleeding during a bowel movement.   This information is not intended to replace advice given to you by your health care provider. Make sure you discuss any questions you have with your health care provider.   Document Released: 10/18/2003 Document  Revised: 02/11/2014 Document Reviewed: 04/02/2011 Elsevier Interactive Patient Education [employer].

## 2014-12-21 NOTE — Op Note (Signed)
COLONOSCOPY PROCEDURE REPORT  PATIENT:  Alyssa Navarro  MR#:  [id number] Birthdate:  Jun 02, 1950, 64 y.o., female Endoscopist:  Dr. Rogene Houston, MD Referred By:  Dr. Alonza Bogus, MD Procedure Date: 12/21/2014  Procedure:   Colonoscopy with snare polypectomy  Indications:  Patient is 64 year old African-American female was undergoing average risk screening colonoscopy.  Informed Consent:  The procedure and risks were reviewed with the patient and informed consent was obtained.  Medications:  Demerol 50 mg IV Versed 4 mg IV  Description of procedure:  After a digital rectal exam was performed, that colonoscope was advanced from the anus through the rectum and colon to the area of the cecum, ileocecal valve and appendiceal orifice. The cecum was deeply intubated. These structures were well-seen and photographed for the record. From the level of the cecum and ileocecal valve, the scope was slowly and cautiously withdrawn. The mucosal surfaces were carefully surveyed utilizing scope tip to flexion to facilitate fold flattening as needed. The scope was pulled down into the rectum where a thorough exam including retroflexion was performed.  Findings:   Prep excellent. 5 mm sessile polyp was cold snared from ascending colon. Another small polyp at ascending colon was removed with cold biopsy forceps. Both of these polyps were submitted together. 10 mm pedunculated polyp snared from splenic flexure. Scattered diverticula at sigmoid colon. Normal rectal mucosa. Small hemorrhoids below the dentate line.   Therapeutic/Diagnostic Maneuvers Performed:  See above  Complications:  None  EBL: Minimal   Cecal Withdrawal Time:  18 minutes  Impression:  Examination performed to cecum. Two small polyps removed from ascending colon and submitted together. One polyp was removed with cold biopsy forceps and the other one was cold snared. 10 mm pedunculated polyp hot snared from splenic  flexure. Mild sigmoid colon diverticulosis. Small external hemorrhoids.  Recommendations:  Standard instructions given. No Aspirin or NSAIDs for 1 week. I will contact patient with biopsy results and further recommendations.  Brizeyda Holtmeyer U  12/21/2014 9:34 AM  CC: Dr. Alonza Bogus, MD & Dr. Rayne Du ref. provider found

## 2014-12-23 ENCOUNTER — Encounter (HOSPITAL_COMMUNITY): Payer: Self-pay | Admitting: Internal Medicine

## 2015-06-10 ENCOUNTER — Other Ambulatory Visit: Payer: Self-pay | Admitting: Obstetrics and Gynecology

## 2015-07-19 ENCOUNTER — Other Ambulatory Visit: Payer: Self-pay | Admitting: Cardiovascular Disease

## 2015-09-15 ENCOUNTER — Encounter: Payer: Self-pay | Admitting: Cardiovascular Disease

## 2015-09-15 ENCOUNTER — Ambulatory Visit (INDEPENDENT_AMBULATORY_CARE_PROVIDER_SITE_OTHER): Payer: Medicare Other | Admitting: Cardiovascular Disease

## 2015-09-15 VITALS — BP 134/80 | HR 70 | Ht 61.0 in | Wt 133.2 lb

## 2015-09-15 DIAGNOSIS — Z79899 Other long term (current) drug therapy: Secondary | ICD-10-CM | POA: Diagnosis not present

## 2015-09-15 DIAGNOSIS — I2510 Atherosclerotic heart disease of native coronary artery without angina pectoris: Secondary | ICD-10-CM

## 2015-09-15 DIAGNOSIS — R5383 Other fatigue: Secondary | ICD-10-CM | POA: Diagnosis not present

## 2015-09-15 DIAGNOSIS — E785 Hyperlipidemia, unspecified: Secondary | ICD-10-CM

## 2015-09-15 DIAGNOSIS — I10 Essential (primary) hypertension: Secondary | ICD-10-CM

## 2015-09-15 MED ORDER — ATORVASTATIN CALCIUM 40 MG PO TABS: 40.0000 mg | ORAL_TABLET | Freq: Every day | ORAL | 3 refills | Status: DC

## 2015-09-15 NOTE — Patient Instructions (Signed)
Dr Croitoru recommends that you continue on your current medications as directed. Please refer to the Current Medication list given to you today.  Your physician recommends that you return for lab work at your earliest convenience - FASTING.  Dr Croitoru recommends that you schedule a follow-up appointment in 1 year. You will receive a reminder letter in the mail two months in advance. If you don't receive a letter, please call our office to schedule the follow-up appointment.  If you need a refill on your cardiac medications before your next appointment, please call your pharmacy. 

## 2015-09-15 NOTE — Progress Notes (Signed)
Cardiology Office Note    Date:  09/15/2015   ID:  Alyssa Navarro, DOB July 07, 1950, MRN [id number]  PCP:  Alonza Bogus, MD  Cardiologist:   Sanda Klein, MD   No chief complaint on file.   History of Present Illness:  Alyssa Navarro is a 65 y.o. female with coronary artery disease returning for routine follow-up. Since her last appointment she has not had any new health challenges. Her mother passed away last 22-Dec-2022 and she has been dealing with legal issues surrounding her state. Denies any problems with angina or dyspnea despite an active lifestyle. She does all her own lawn care and yard work without any limitations. She tripped on the sidewalk and sprained her left ankle a few weeks ago. She denies syncope, palpitations, dizziness, leg edema, claudication, but does complain of fatigue. She also complains of thinning hair. She denies heat/cold intolerance, major weight changes, memory problems, visual difficulties, other focal neurological complaints.  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 She required percutaneous revascularization in 1998 (3.5x25 NIR to mid dominant left circumflex artery, 3.0x16 NIR to distal LCX, 50-60% mid LAD untreated), but no further procedure since that time.  Past Medical History:  Diagnosis Date  . Coronary artery disease   . Hyperlipidemia   . Hypertension     Past Surgical History:  Procedure Laterality Date  . 2D Echocardiogram  12/22/2007   EF >55%, normal.  . ABDOMINAL HYSTERECTOMY    . ARTHROSCOPY WITH ANTERIOR CRUCIATE LIGAMENT (ACL) REPAIR WITH ANTERIOR TIBILIAS GRAFT Right   . CARDIAC CATHETERIZATION  02/12/1991   CAD demonstrated in Proximal LAD-50% stenosis, Distal LAD-60%, Mid Circumflex-60% stenosis, Marginal Circumflex-80% stenosis, Proximal RCA-70% stenosis  . CARDIAC CATHETERIZATION  11/12/1993   Mild-moderate 3-vessel disease-50% mid LAD, 75%  mid circumflex, 80% ostial portion L circumflex.  Marland Kitchen CARDIAC CATHETERIZATION  12/03/1996   95% mid circumflex stenosis stented with a 3.5x48mm Sci-Med NIR, resulting in reduciton to 0%. 75% distal circumflex stenosis stented with a 3.0x39mm NIR stent resulting in reduciton to 0%.  . CARDIAC CATHETERIZATION  08/23/2003   Continued medical therapy. Patent L circumflex stents placed in 1998.  . CHOLECYSTECTOMY    . COLONOSCOPY N/A 12/21/2014   Procedure: COLONOSCOPY;  Surgeon: Rogene Houston, MD;  [facility];  Service: Endoscopy;  Laterality: N/A;  830  . Lexiscan Myoview  12/22/2007   No ECG changes, nondiagnostic EKG. Perfusion defect in inferior myocardial region consistent with diaphragmatic attenuation.    Current Medications: Outpatient Medications Prior to Visit  Medication Sig Dispense Refill  . acetaminophen (TYLENOL) 325 MG tablet Take 650 mg by mouth every 6 (six) hours as needed for moderate pain.    Marland Kitchen amLODipine (NORVASC) 10 MG tablet Take 10 mg by mouth daily.    Marland Kitchen atorvastatin (LIPITOR) 40 MG tablet TAKE 1 TABLET(40 MG) BY MOUTH DAILY 90 tablet 0  . cholecalciferol (VITAMIN D) 1000 UNITS tablet Take 2,000-3,000 Units by mouth daily.     . Coenzyme Q10 (CO Q10) 100 MG CAPS Take 100-200 mg by mouth daily.     Marland Kitchen ibuprofen (ADVIL,MOTRIN) 200 MG tablet Take 400 mg by mouth every 6 (six) hours as needed for moderate pain.    Marland Kitchen KRILL OIL PO Take 1 tablet by mouth daily.    . metoprolol succinate (TOPROL-XL) 25 MG 24 hr tablet TAKE 1 TABLET BY MOUTH EVERY DAY. 30 tablet 10  .  Calcium-Magnesium-Zinc (CAL-MAG-ZINC PO) Take 2 tablets by mouth every other day.     . estradiol (VIVELLE-DOT) 0.05 MG/24HR patch APPLY 1 PATCH(0.05 MG) EXTERNALLY TO THE SKIN 2 TIMES A WEEK (Patient not taking: Reported on 09/15/2015) 8 patch 11  . Multiple Vitamin (MULTIVITAMIN WITH MINERALS) TABS tablet Take 1 tablet by mouth daily.     No facility-administered medications prior to visit.       Allergies:   Avelox [moxifloxacin hcl in nacl]; Quinolones; Statins; Latex; and Sulfa antibiotics   Social History   Social History  . Marital status: Divorced    Spouse name: N/A  . Number of children: N/A  . Years of education: N/A   Social History Main Topics  . Smoking status: Never Smoker  . Smokeless tobacco: Never Used  . Alcohol use No  . Drug use: No  . Sexual activity: Yes    Birth control/ protection: Surgical   Other Topics Concern  . None   Social History Narrative  . None     Family History:  The patient's family history includes Cancer in her maternal aunt and maternal grandmother; Diabetes in her maternal grandmother, mother, and other; Fibroids in her sister; Heart disease in her sister; Heart failure in her other and paternal grandmother; Hyperlipidemia in her father and mother; Hypertension in her mother, other, and paternal grandmother.   ROS:   Please see the history of present illness.    ROS All other systems reviewed and are negative.   PHYSICAL EXAM:   VS:  BP 134/80   Pulse 70   Ht 5\' 1"  (1.549 m)   Wt 133 lb 3.2 oz (60.4 kg)   BMI 25.17 kg/m    GEN: Well nourished, well developed, in no acute distress  HEENT: normal  Neck: no JVD, carotid bruits, or masses Cardiac: RRR; no murmurs, rubs, or gallops,no edema  Respiratory:  clear to auscultation bilaterally, normal work of breathing GI: soft, nontender, nondistended, + BS MS: no deformity or atrophy  Skin: warm and dry, no rash Neuro:  Alert and Oriented x 3, Strength and sensation are intact Psych: euthymic mood, full affect  Wt Readings from Last 3 Encounters:  09/15/15 133 lb 3.2 oz (60.4 kg)  12/21/14 130 lb (59 kg)  08/12/14 137 lb (62.1 kg)      Studies/Labs Reviewed:   EKG:  EKG is ordered today.  The ekg ordered today demonstrates Normal sinus rhythm with minor nonspecific ST segment depression seen in leads 2, 3, F, V3-V4, unchanged from 2016  Recent Labs: 09/16/2014:  ALT 27; BUN 12; Creat 0.58; Potassium 3.7; Sodium 142   Lipid Panel    Component Value Date/Time   CHOL 224 (H) 09/16/2014 0904   TRIG 177 (H) 09/16/2014 0904   HDL 52 09/16/2014 0904   CHOLHDL 4.3 09/16/2014 0904   VLDL 35 (H) 09/16/2014 0904   LDLCALC 137 (H) 09/16/2014 0904     ASSESSMENT:    1. Coronary artery disease involving native coronary artery of native heart without angina pectoris   2. Hyperlipidemia with target LDL less than 70   3. Essential hypertension   4. Other fatigue   5. Medication management      PLAN:  In order of problems listed above:  1. CAD:  Asymptomatic. Weight and blood pressure are in desirable range.  2. HLP: Time to recheck her lipids. She is on an appropriate dose of highly active statin (she stopped ezetimibe due to cost, for the same  reason not interested in PCS K9 inhibitors), although her LDL cholesterol is not at goal it has shown a substantial drop from baseline. She has not had any coronary or vascular events in about 20 years on the current medical regimen. 3. HTN: Well controlled 4. We'll check her thyroid function tests and hemoglobin level    Medication Adjustments/Labs and Tests Ordered: Current medicines are reviewed at length with the patient today.  Concerns regarding medicines are outlined above.  Medication changes, Labs and Tests ordered today are listed in the Patient Instructions below. There are no Patient Instructions on file for this visit.   Signed, Sanda Klein, MD  09/15/2015 9:16 AM    [facility] [address], [place], [place]  [address] Phone: [phone]; Fax: [phone]

## 2015-09-22 DIAGNOSIS — I2510 Atherosclerotic heart disease of native coronary artery without angina pectoris: Secondary | ICD-10-CM | POA: Diagnosis not present

## 2015-09-22 DIAGNOSIS — Z79899 Other long term (current) drug therapy: Secondary | ICD-10-CM | POA: Diagnosis not present

## 2015-09-22 DIAGNOSIS — R5383 Other fatigue: Secondary | ICD-10-CM | POA: Diagnosis not present

## 2015-09-22 DIAGNOSIS — E785 Hyperlipidemia, unspecified: Secondary | ICD-10-CM | POA: Diagnosis not present

## 2015-09-22 LAB — LIPID PANEL
CHOL/HDL RATIO: 7.8 ratio — AB (ref <=5.0)
CHOLESTEROL: 429 mg/dL — AB (ref 125–200)
HDL: 55 mg/dL (ref >=46)
LDL Cholesterol: 328 mg/dL — ABNORMAL HIGH (ref <130)
TRIGLYCERIDES: 229 mg/dL — AB (ref <150)
VLDL: 46 mg/dL — ABNORMAL HIGH (ref <30)

## 2015-09-22 LAB — COMPREHENSIVE METABOLIC PANEL
ALBUMIN: 4 g/dL (ref 3.6–5.1)
ALK PHOS: 88 U/L (ref 33–130)
ALT: 17 U/L (ref 6–29)
AST: 16 U/L (ref 10–35)
BUN: 10 mg/dL (ref 7–25)
CALCIUM: 9.3 mg/dL (ref 8.6–10.4)
CO2: 27 mmol/L (ref 20–31)
Chloride: 104 mmol/L (ref 98–110)
Creat: 0.61 mg/dL (ref 0.50–0.99)
Glucose, Bld: 90 mg/dL (ref 65–99)
POTASSIUM: 3.7 mmol/L (ref 3.5–5.3)
Sodium: 140 mmol/L (ref 135–146)
Total Bilirubin: 0.5 mg/dL (ref 0.2–1.2)
Total Protein: 6.6 g/dL (ref 6.1–8.1)

## 2015-09-22 LAB — CBC
HEMATOCRIT: 38.6 % (ref 35.0–45.0)
Hemoglobin: 13.1 g/dL (ref 11.7–15.5)
MCH: 29.6 pg (ref 27.0–33.0)
MCHC: 33.9 g/dL (ref 32.0–36.0)
MCV: 87.1 fL (ref 80.0–100.0)
MPV: 13 fL — AB (ref 7.5–12.5)
Platelets: 196 10*3/uL (ref 140–400)
RBC: 4.43 MIL/uL (ref 3.80–5.10)
RDW: 14.9 % (ref 11.0–15.0)
WBC: 5.6 10*3/uL (ref 3.8–10.8)

## 2015-09-22 LAB — TSH: TSH: 1.33 mIU/L

## 2015-10-03 ENCOUNTER — Telehealth: Payer: Self-pay

## 2015-10-03 DIAGNOSIS — E785 Hyperlipidemia, unspecified: Secondary | ICD-10-CM

## 2015-10-03 NOTE — Telephone Encounter (Signed)
-----   Message from Sanda Klein, MD sent at 09/29/2015  9:48 AM EDT ----- Understood. Please repeat lipid profile in 3 months

## 2015-10-03 NOTE — Telephone Encounter (Signed)
Reviewed recommendations with patient. Patient verbalized understanding.  Labs ordered, printed and will be mailed to patient.

## 2015-10-04 ENCOUNTER — Other Ambulatory Visit: Payer: Self-pay

## 2015-10-06 ENCOUNTER — Other Ambulatory Visit (HOSPITAL_COMMUNITY): Payer: Self-pay | Admitting: Pulmonary Disease

## 2015-10-06 DIAGNOSIS — Z780 Asymptomatic menopausal state: Secondary | ICD-10-CM

## 2015-10-06 DIAGNOSIS — Z0000 Encounter for general adult medical examination without abnormal findings: Secondary | ICD-10-CM | POA: Diagnosis not present

## 2015-10-11 ENCOUNTER — Ambulatory Visit (HOSPITAL_COMMUNITY)
Admission: RE | Admit: 2015-10-11 | Discharge: 2015-10-11 | Disposition: A | Discharge status: Home / Self Care | Payer: Medicare Other | Source: Ambulatory Visit | Attending: Pulmonary Disease | Admitting: Pulmonary Disease

## 2015-10-11 ENCOUNTER — Other Ambulatory Visit: Payer: Self-pay | Admitting: Obstetrics and Gynecology

## 2015-10-11 DIAGNOSIS — N951 Menopausal and female climacteric states: Secondary | ICD-10-CM | POA: Diagnosis not present

## 2015-10-11 DIAGNOSIS — Z780 Asymptomatic menopausal state: Secondary | ICD-10-CM

## 2015-10-11 DIAGNOSIS — M8588 Other specified disorders of bone density and structure, other site: Secondary | ICD-10-CM | POA: Diagnosis not present

## 2015-10-11 DIAGNOSIS — M8589 Other specified disorders of bone density and structure, multiple sites: Secondary | ICD-10-CM | POA: Diagnosis not present

## 2015-10-11 DIAGNOSIS — Z1231 Encounter for screening mammogram for malignant neoplasm of breast: Secondary | ICD-10-CM

## 2015-10-13 DIAGNOSIS — Z23 Encounter for immunization: Secondary | ICD-10-CM | POA: Diagnosis not present

## 2015-10-26 ENCOUNTER — Ambulatory Visit (HOSPITAL_COMMUNITY): Payer: Medicare Other

## 2015-10-30 ENCOUNTER — Other Ambulatory Visit: Payer: Self-pay | Admitting: Cardiovascular Disease

## 2015-10-30 NOTE — Telephone Encounter (Signed)
Rx has been sent to the pharmacy electronically. ° °

## 2015-11-24 ENCOUNTER — Ambulatory Visit (HOSPITAL_COMMUNITY)
Admission: RE | Admit: 2015-11-24 | Discharge: 2015-11-24 | Disposition: A | Discharge status: Home / Self Care | Payer: Medicare Other | Source: Ambulatory Visit | Attending: Obstetrics and Gynecology | Admitting: Obstetrics and Gynecology

## 2015-11-24 DIAGNOSIS — Z1231 Encounter for screening mammogram for malignant neoplasm of breast: Secondary | ICD-10-CM | POA: Diagnosis not present

## 2015-11-27 ENCOUNTER — Other Ambulatory Visit: Payer: Self-pay | Admitting: Obstetrics and Gynecology

## 2015-11-27 DIAGNOSIS — R928 Other abnormal and inconclusive findings on diagnostic imaging of breast: Secondary | ICD-10-CM

## 2015-12-19 ENCOUNTER — Other Ambulatory Visit: Payer: Self-pay | Admitting: Obstetrics and Gynecology

## 2015-12-19 ENCOUNTER — Ambulatory Visit (HOSPITAL_COMMUNITY)
Admission: RE | Admit: 2015-12-19 | Discharge: 2015-12-19 | Disposition: A | Discharge status: Home / Self Care | Payer: Medicare Other | Source: Ambulatory Visit | Attending: Obstetrics and Gynecology | Admitting: Obstetrics and Gynecology

## 2015-12-19 DIAGNOSIS — N6489 Other specified disorders of breast: Secondary | ICD-10-CM | POA: Diagnosis not present

## 2015-12-19 DIAGNOSIS — R928 Other abnormal and inconclusive findings on diagnostic imaging of breast: Secondary | ICD-10-CM

## 2015-12-19 DIAGNOSIS — N6310 Unspecified lump in the right breast, unspecified quadrant: Secondary | ICD-10-CM

## 2015-12-19 DIAGNOSIS — N6312 Unspecified lump in the right breast, upper inner quadrant: Secondary | ICD-10-CM | POA: Diagnosis not present

## 2015-12-25 ENCOUNTER — Other Ambulatory Visit: Payer: Self-pay | Admitting: Obstetrics and Gynecology

## 2015-12-25 ENCOUNTER — Ambulatory Visit
Admission: RE | Admit: 2015-12-25 | Discharge: 2015-12-25 | Disposition: A | Discharge status: Home / Self Care | Payer: Medicare Other | Source: Ambulatory Visit | Attending: Obstetrics and Gynecology | Admitting: Obstetrics and Gynecology

## 2015-12-25 DIAGNOSIS — N6489 Other specified disorders of breast: Secondary | ICD-10-CM

## 2015-12-25 DIAGNOSIS — N6011 Diffuse cystic mastopathy of right breast: Secondary | ICD-10-CM | POA: Diagnosis not present

## 2015-12-25 DIAGNOSIS — R928 Other abnormal and inconclusive findings on diagnostic imaging of breast: Secondary | ICD-10-CM | POA: Diagnosis not present

## 2015-12-25 HISTORY — PX: BREAST BIOPSY: SHX20

## 2016-11-05 DIAGNOSIS — Z23 Encounter for immunization: Secondary | ICD-10-CM | POA: Diagnosis not present

## 2016-11-06 ENCOUNTER — Other Ambulatory Visit: Payer: Self-pay | Admitting: Cardiovascular Disease

## 2016-12-02 ENCOUNTER — Other Ambulatory Visit: Payer: Self-pay | Admitting: Cardiovascular Disease

## 2017-03-13 DIAGNOSIS — I2510 Atherosclerotic heart disease of native coronary artery without angina pectoris: Secondary | ICD-10-CM | POA: Diagnosis not present

## 2017-03-13 DIAGNOSIS — E785 Hyperlipidemia, unspecified: Secondary | ICD-10-CM | POA: Diagnosis not present

## 2017-03-13 DIAGNOSIS — I10 Essential (primary) hypertension: Secondary | ICD-10-CM | POA: Diagnosis not present

## 2017-03-13 DIAGNOSIS — L03115 Cellulitis of right lower limb: Secondary | ICD-10-CM | POA: Diagnosis not present

## 2017-03-21 ENCOUNTER — Encounter: Payer: Self-pay | Admitting: Cardiovascular Disease

## 2017-03-21 DIAGNOSIS — E785 Hyperlipidemia, unspecified: Secondary | ICD-10-CM | POA: Diagnosis not present

## 2017-03-21 DIAGNOSIS — I2510 Atherosclerotic heart disease of native coronary artery without angina pectoris: Secondary | ICD-10-CM | POA: Diagnosis not present

## 2017-03-21 DIAGNOSIS — I10 Essential (primary) hypertension: Secondary | ICD-10-CM | POA: Diagnosis not present

## 2017-03-21 DIAGNOSIS — L03115 Cellulitis of right lower limb: Secondary | ICD-10-CM | POA: Diagnosis not present

## 2017-03-21 LAB — CBC WITH DIFF/PLATELET
BASO(ABSOLUTE): 33
Basophils: 0.4
Eosinophils Absolute: 374
Eosinophils, %: 4.5
HCT: 38 (ref 29–41)
Hemoglobin: 12.5
Lymphocytes absolute: 2415 10*3/uL — AB (ref 0.1–1.8)
Lymphocytes: 29.1
MCH: 28.9
MCHC: 33.2
MCV: 86.8 (ref 76–111)
MPV: 12.9 fL (ref 0–99.8)
Monocytes(Absolute): 523
Monocytes: 6.3
Neutro Abs: 4955
Neutrophils: 59.7
RBC: 4.33
RDW: 14.1
WBC: 8.3
platelet count: 212

## 2017-03-21 LAB — CMP 10231
ALBUMIN/GLOBULIN RATIO: 1.6
ALT: 22 (ref 3–30)
AST: 17
Albumin: 4.1
Alkaline Phosphatase: 101
BUN: 22 — AB (ref 4–21)
Calcium: 10.1
Carbon Dioxide, Total: 28
Chloride: 106
Creat: 0.71
EGFR (African American): 101
EGFR (Non-African Amer.): 88
Globulin: 2.5
Glucose: 94
Potassium: 4.1
Sodium: 142
Total Bilirubin: 0.5
Total Protein: 6.6 (ref 6.4–8.2)

## 2017-03-21 LAB — LIPID PANEL
Cholesterol, Total: 234
HDL Cholesterol: 59 (ref 35–70)
LDL Cholesterol: 150
Non HDL Cholesterol: 175
Total CHOL/HDL Ratio: 4
Triglycerides: 126 (ref 40–160)

## 2017-03-21 LAB — TSH: TSH: 1.59

## 2017-04-09 ENCOUNTER — Other Ambulatory Visit: Payer: Self-pay | Admitting: Cardiovascular Disease

## 2017-06-02 DIAGNOSIS — R21 Rash and other nonspecific skin eruption: Secondary | ICD-10-CM | POA: Diagnosis not present

## 2017-06-17 ENCOUNTER — Other Ambulatory Visit: Payer: Self-pay | Admitting: Cardiovascular Disease

## 2017-07-17 DIAGNOSIS — B86 Scabies: Secondary | ICD-10-CM | POA: Diagnosis not present

## 2017-08-14 DIAGNOSIS — L258 Unspecified contact dermatitis due to other agents: Secondary | ICD-10-CM | POA: Diagnosis not present

## 2017-09-18 DIAGNOSIS — L258 Unspecified contact dermatitis due to other agents: Secondary | ICD-10-CM | POA: Diagnosis not present

## 2017-12-09 ENCOUNTER — Ambulatory Visit (INDEPENDENT_AMBULATORY_CARE_PROVIDER_SITE_OTHER): Payer: Medicare Other | Admitting: Cardiovascular Disease

## 2017-12-09 ENCOUNTER — Encounter: Payer: Self-pay | Admitting: Cardiovascular Disease

## 2017-12-09 VITALS — BP 164/78 | HR 74 | Ht 61.0 in | Wt 133.0 lb

## 2017-12-09 DIAGNOSIS — I2510 Atherosclerotic heart disease of native coronary artery without angina pectoris: Secondary | ICD-10-CM | POA: Diagnosis not present

## 2017-12-09 DIAGNOSIS — Z23 Encounter for immunization: Secondary | ICD-10-CM | POA: Diagnosis not present

## 2017-12-09 DIAGNOSIS — E782 Mixed hyperlipidemia: Secondary | ICD-10-CM | POA: Diagnosis not present

## 2017-12-09 DIAGNOSIS — I10 Essential (primary) hypertension: Secondary | ICD-10-CM

## 2017-12-09 MED ORDER — CARVEDILOL 6.25 MG PO TABS: 6.2500 mg | ORAL_TABLET | Freq: Two times a day (BID) | ORAL | 3 refills | Status: DC

## 2017-12-09 NOTE — Patient Instructions (Addendum)
Medication Instructions:  Dr Sallyanne Kuster has recommended making the following medication changes: 1. STOP Metoprolol 2. START Carvedilol 6.25 mg - take 1 tablet twice daily  Your physician has requested that you regularly monitor your blood pressure at home. Please use the same machine to check your blood pressure daily. Keep a record of your blood pressures using the log sheet provided. In 1 week, please report your readings back to Dr C. You may use our online patient portal 'MyChart' or you can call the office to speak with a nurse.  Follow-Up: At [facility], you and your health needs are our priority.  As part of our continuing mission to provide you with exceptional heart care, we have created designated Provider Care Teams.  These Care Teams include your primary Cardiologist (physician) and Advanced Practice Providers (APPs -  Physician Assistants and Nurse Practitioners) who all work together to provide you with the care you need, when you need it. You will need a follow up appointment in 6 months.  Please call our office 2 months in advance to schedule this appointment.  You may see Sanda Klein, MD or one of the following Advanced Practice Providers on your designated Care Team: [place], [place] . Fabian Sharp, PA-C

## 2017-12-13 NOTE — Progress Notes (Signed)
Patient ID: Alyssa Navarro, female   DOB: 12/20/1950, 67 y.o.   MRN: [id number]     Cardiology Office Note   Date:  12/14/2017   ID:  Alyssa Navarro, DOB 1950-11-08, MRN [id number]  PCP:  Sinda Du, MD  Cardiologist:   Sanda Klein, MD   Chief Complaint  Patient presents with  . Coronary Artery Disease      History of Present Illness: Alyssa Navarro is a 67 y.o. female who presents for  Routine follow-up for coronary artery disease with remote history of revascularization procedures in the 1990s.   She feels well and has no cardiovascular complaints.  She walks about a mile a day.   The patient specifically denies any chest pain at rest exertion, dyspnea at rest or with exertion, orthopnea, paroxysmal nocturnal dyspnea, syncope, palpitations, focal neurological deficits, intermittent claudication, lower extremity edema, unexplained weight gain, cough, hemoptysis or wheezing.  She had a recurrent pruritic rash and wonders whether it could be from metoprolol.  She has even taken prednisone for it.  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 She required percutaneous revascularization in 1998 (3.5x25 NIR to mid dominant left circumflex artery, 3.0x16 NIR to distal LCX, 50-60% mid LAD untreated), but no further procedures since that time.  Past Medical History:  Diagnosis Date  . Coronary artery disease   . Hyperlipidemia   . Hypertension     Past Surgical History:  Procedure Laterality Date  . 2D Echocardiogram  12/22/2007   EF >55%, normal.  . ABDOMINAL HYSTERECTOMY    . ARTHROSCOPY WITH ANTERIOR CRUCIATE LIGAMENT (ACL) REPAIR WITH ANTERIOR TIBILIAS GRAFT Right   . CARDIAC CATHETERIZATION  02/12/1991   CAD demonstrated in Proximal LAD-50% stenosis, Distal LAD-60%, Mid Circumflex-60% stenosis, Marginal Circumflex-80% stenosis, Proximal RCA-70% stenosis  . CARDIAC  CATHETERIZATION  11/12/1993   Mild-moderate 3-vessel disease-50% mid LAD, 75% mid circumflex, 80% ostial portion L circumflex.  Marland Kitchen CARDIAC CATHETERIZATION  12/03/1996   95% mid circumflex stenosis stented with a 3.5x31mm Sci-Med NIR, resulting in reduciton to 0%. 75% distal circumflex stenosis stented with a 3.0x61mm NIR stent resulting in reduciton to 0%.  . CARDIAC CATHETERIZATION  08/23/2003   Continued medical therapy. Patent L circumflex stents placed in 1998.  . CHOLECYSTECTOMY    . COLONOSCOPY N/A 12/21/2014   Procedure: COLONOSCOPY;  Surgeon: Rogene Houston, MD;  [facility];  Service: Endoscopy;  Laterality: N/A;  830  . Lexiscan Myoview  12/22/2007   No ECG changes, nondiagnostic EKG. Perfusion defect in inferior myocardial region consistent with diaphragmatic attenuation.     Current Outpatient Medications  Medication Sig Dispense Refill  . acetaminophen (TYLENOL) 325 MG tablet Take 650 mg by mouth every 6 (six) hours as needed for moderate pain.    Marland Kitchen amLODipine (NORVASC) 10 MG tablet Take 10 mg by mouth daily.    Marland Kitchen atorvastatin (LIPITOR) 40 MG tablet TAKE 1 TABLET BY MOUTH EVERY DAY AT 6 PM 90 tablet 0  . Calcium Carb-Cholecalciferol (CALTRATE 600+D3 PO) Take 1 tablet by mouth daily.    . cholecalciferol (VITAMIN D) 1000 UNITS tablet Take 2,000-3,000 Units by mouth daily.     . clobetasol ointment (TEMOVATE) 3.87 % Apply 1 application topically daily as needed for rash.    . Coenzyme Q10 (CO Q10) 100 MG CAPS Take 100-200 mg by mouth daily.     Marland Kitchen ibuprofen (ADVIL,MOTRIN) 200 MG tablet  Take 400 mg by mouth every 6 (six) hours as needed for moderate pain.    Marland Kitchen KRILL OIL PO Take 1 tablet by mouth daily.    . Multiple Vitamins-Minerals (CENTRUM SILVER PO) Take 1 tablet by mouth daily.    Marland Kitchen triamcinolone cream (KENALOG) 0.1 % Apply 1 application topically 2 (two) times daily.    . carvedilol (COREG) 6.25 MG tablet Take 1 tablet (6.25 mg total) by mouth 2 (two) times daily.  180 tablet 3   No current facility-administered medications for this visit.     Allergies:   Avelox [moxifloxacin hcl in nacl]; Quinolones; Statins; Latex; Metoprolol; and Sulfa antibiotics    Social History:  The patient  reports that she has never smoked. She has never used smokeless tobacco. She reports that she does not drink alcohol or use drugs.   Family History:  The patient's family history includes Cancer in her maternal aunt and maternal grandmother; Diabetes in her maternal grandmother, mother, and other; Fibroids in her sister; Heart disease in her sister; Heart failure in her other and paternal grandmother; Hyperlipidemia in her father and mother; Hypertension in her mother, other, and paternal grandmother.    ROS:  Please see the history of present illness.    Otherwise, review of systems positive for none.   All other systems are reviewed and are negative.    PHYSICAL EXAM: VS:  BP (!) 164/78   Pulse 74   Ht 5\' 1"  (1.549 m)   Wt 133 lb (60.3 kg)   BMI 25.13 kg/m  , BMI Body mass index is 25.13 kg/m.   General: Alert, oriented x3, no distress, appears lean and fit Head: no evidence of trauma, PERRL, EOMI, no exophtalmos or lid lag, no myxedema, no xanthelasma; normal ears, nose and oropharynx Neck: normal jugular venous pulsations and no hepatojugular reflux; brisk carotid pulses without delay and no carotid bruits Chest: clear to auscultation, no signs of consolidation by percussion or palpation, normal fremitus, symmetrical and full respiratory excursions Cardiovascular: normal position and quality of the apical impulse, regular rhythm, normal first and second heart sounds, no murmurs, rubs or gallops Abdomen: no tenderness or distention, no masses by palpation, no abnormal pulsatility or arterial bruits, normal bowel sounds, no hepatosplenomegaly Extremities: no clubbing, cyanosis or edema; 2+ radial, ulnar and brachial pulses bilaterally; 2+ right femoral,  posterior tibial and dorsalis pedis pulses; 2+ left femoral, posterior tibial and dorsalis pedis pulses; no subclavian or femoral bruits Neurological: grossly nonfocal Psych: Normal mood and affect   EKG:  EKG is ordered today.  Shows normal sinus rhythm with pre-existing nonspecific T wave flattening in leads V3-V5, QTC 412 ms  Recent Labs: No results found for requested labs within last 8760 hours.    Lipid Panel    Component Value Date/Time   CHOL 429 (H) 09/22/2015 0835   TRIG 229 (H) 09/22/2015 0835   HDL 55 09/22/2015 0835   CHOLHDL 7.8 (H) 09/22/2015 0835   VLDL 46 (H) 09/22/2015 0835   LDLCALC 328 (H) 09/22/2015 0835      Wt Readings from Last 3 Encounters:  12/09/17 133 lb (60.3 kg)  09/15/15 133 lb 3.2 oz (60.4 kg)  12/21/14 130 lb (59 kg)       ASSESSMENT AND PLAN:  1. CAD: asymptomatic, no events since the 1990s. 2. Hyperlipidemia need to get updated lipid values, discussed PCSK9 inhibitors 3. HTN: Possible metabolic related rash, will switch to carvedilol, reevaluate in 1-2 weeks.   Current medicines  are reviewed at length with the patient today.    Labs/ tests ordered today include:   Orders Placed This Encounter  Procedures  . EKG 12-Lead   Patient Instructions  Medication Instructions:  Dr Sallyanne Kuster has recommended making the following medication changes: 1. STOP Metoprolol 2. START Carvedilol 6.25 mg - take 1 tablet twice daily  Your physician has requested that you regularly monitor your blood pressure at home. Please use the same machine to check your blood pressure daily. Keep a record of your blood pressures using the log sheet provided. In 1 week, please report your readings back to Dr C. You may use our online patient portal 'MyChart' or you can call the office to speak with a nurse.  Follow-Up: At [facility], you and your health needs are our priority.  As part of our continuing mission to provide you with exceptional heart care, we  have created designated Provider Care Teams.  These Care Teams include your primary Cardiologist (physician) and Advanced Practice Providers (APPs -  Physician Assistants and Nurse Practitioners) who all work together to provide you with the care you need, when you need it. You will need a follow up appointment in 6 months.  Please call our office 2 months in advance to schedule this appointment.  You may see Sanda Klein, MD or one of the following Advanced Practice Providers on your designated Care Team: [place], [place] . Fabian Sharp, PA-C     Signed, Sanda Klein, MD  12/14/2017 9:43 AM    Sanda Klein, MD, [facility] [phone] office [phone] pager

## 2017-12-14 ENCOUNTER — Encounter: Payer: Self-pay | Admitting: Cardiovascular Disease

## 2018-02-25 ENCOUNTER — Telehealth: Payer: Self-pay

## 2018-02-25 DIAGNOSIS — E782 Mixed hyperlipidemia: Secondary | ICD-10-CM

## 2018-02-25 DIAGNOSIS — I2510 Atherosclerotic heart disease of native coronary artery without angina pectoris: Secondary | ICD-10-CM

## 2018-02-25 NOTE — Telephone Encounter (Signed)
Opened in error

## 2018-02-25 NOTE — Telephone Encounter (Signed)
Labs in [place] from 03/21/17. Called Dr Luan Pulling office to confirm - they are in fact the last labs drawn. Labs placed in Dr [employer] box for him to review.

## 2018-02-25 NOTE — Telephone Encounter (Signed)
-----   Message from Sanda Klein, MD sent at 12/14/2017  9:42 AM EST ----- Need an updated lipid profile from Dr. Luan Pulling, please. MCr

## 2018-02-26 ENCOUNTER — Telehealth: Payer: Self-pay | Admitting: Cardiovascular Disease

## 2018-02-26 NOTE — Telephone Encounter (Signed)
It is getting close to a year since she had a lipid profile.  Time to repeat.  She can have it done with Dr. Luan Pulling in [place] but we need to get a copy.  Please remind her that I believe she needs to be on Repatha or Praluent for her hypercholesterolemia with history of previous coronary problems.  Target LDL less than 70, breaking (was 150 last February). MCr

## 2018-02-26 NOTE — Addendum Note (Signed)
Addended by: Kathyrn Lass on: 02/26/2018 03:51 PM   Modules accepted: Orders

## 2018-02-26 NOTE — Telephone Encounter (Signed)
Called patient left message to call me back time to repeat lipid profile.

## 2018-02-26 NOTE — Telephone Encounter (Signed)
New message    Pt returning call about lipids

## 2018-02-26 NOTE — Telephone Encounter (Signed)
Spoke to patient Dr.Croitoru's recommendation given.Stated she will have lipid panel done next week at [facility] in [place].Lab orders mailed.

## 2018-02-26 NOTE — Telephone Encounter (Signed)
Spoke to patient Dr.Croitoru advised time to have lipid panel.Stated she will have done next week at [facility] in [place].Lab orders mailed.

## 2018-05-07 DIAGNOSIS — Z0000 Encounter for general adult medical examination without abnormal findings: Secondary | ICD-10-CM | POA: Diagnosis not present

## 2018-05-29 ENCOUNTER — Telehealth: Payer: Self-pay | Admitting: Cardiovascular Disease

## 2018-05-29 NOTE — Telephone Encounter (Signed)
LVM to schedule recall. °

## 2018-06-02 ENCOUNTER — Telehealth: Payer: Self-pay | Admitting: *Deleted

## 2018-06-02 NOTE — Telephone Encounter (Signed)
06/02/18 LMOM @ 0940 am,re: follow appointment.

## 2018-06-10 ENCOUNTER — Telehealth: Payer: Self-pay | Admitting: Cardiovascular Disease

## 2018-06-10 NOTE — Telephone Encounter (Signed)
Sent My-Chart message

## 2018-08-11 DIAGNOSIS — Z0000 Encounter for general adult medical examination without abnormal findings: Secondary | ICD-10-CM | POA: Diagnosis not present

## 2018-08-12 ENCOUNTER — Other Ambulatory Visit (HOSPITAL_COMMUNITY): Payer: Self-pay | Admitting: Pulmonary Disease

## 2018-08-12 DIAGNOSIS — Z780 Asymptomatic menopausal state: Secondary | ICD-10-CM

## 2018-08-17 ENCOUNTER — Telehealth: Payer: Self-pay | Admitting: Cardiovascular Disease

## 2018-08-17 NOTE — Telephone Encounter (Signed)
I called pt to confirm her appt with Dr C on 08-18-18.        1. Confirm consent - "In the setting of the current Covid19 crisis, you are scheduled for a (phone or video) visit with your provider on (date) at (time).  Just as we do with many in-office visits, in order for you to participate in this visit, we must obtain consent.  If you'd like, I can send this to your mychart (if signed up) or email for you to review.  Otherwise, I can obtain your verbal consent now.  All virtual visits are billed to your insurance company just like a normal visit would be.  By agreeing to a virtual visit, we'd like you to understand that the technology does not allow for your provider to perform an examination, and thus may limit your provider's ability to fully assess your condition. If your provider identifies any concerns that need to be evaluated in person, we will make arrangements to do so.  Finally, though the technology is pretty good, we cannot assure that it will always work on either your or our end, and in the setting of a video visit, we may have to convert it to a phone-only visit.  In either situation, we cannot ensure that we have a secure connection.  Are you willing to proceed?" STAFF: Did the patient verbally acknowledge consent to telehealth visit? Document YES/NO here:      FULL LENGTH CONSENT FOR TELE-HEALTH VISIT   I hereby voluntarily request, consent and authorize [facility] and its employed or contracted physicians, physician assistants, nurse practitioners or other licensed health care professionals (the Practitioner), to provide me with telemedicine health care services (the Services") as deemed necessary by the treating Practitioner. I acknowledge and consent to receive the Services by the Practitioner via telemedicine. I understand that the telemedicine visit will involve communicating with the Practitioner through live audiovisual communication technology and the disclosure of  certain medical information by electronic transmission. I acknowledge that I have been given the opportunity to request an in-person assessment or other available alternative prior to the telemedicine visit and am voluntarily participating in the telemedicine visit.  I understand that I have the right to withhold or withdraw my consent to the use of telemedicine in the course of my care at any time, without affecting my right to future care or treatment, and that the Practitioner or I may terminate the telemedicine visit at any time. I understand that I have the right to inspect all information obtained and/or recorded in the course of the telemedicine visit and may receive copies of available information for a reasonable fee.  I understand that some of the potential risks of receiving the Services via telemedicine include:   Delay or interruption in medical evaluation due to technological equipment failure or disruption;  Information transmitted may not be sufficient (e.g. poor resolution of images) to allow for appropriate medical decision making by the Practitioner; and/or   In rare instances, security protocols could fail, causing a breach of personal health information.  Furthermore, I acknowledge that it is my responsibility to provide information about my medical history, conditions and care that is complete and accurate to the best of my ability. I acknowledge that Practitioner's advice, recommendations, and/or decision may be based on factors not within their control, such as incomplete or inaccurate data provided by me or distortions of diagnostic images or specimens that may result from electronic transmissions. I understand that the practice  of medicine is not an Chief Strategy Officer and that Practitioner makes no warranties or guarantees regarding treatment outcomes. I acknowledge that I will receive a copy of this consent concurrently upon execution via email to the email address I last provided but  may also request a printed copy by calling the office of [place].    I understand that my insurance will be billed for this visit.   I have read or had this consent read to me.  I understand the contents of this consent, which adequately explains the benefits and risks of the Services being provided via telemedicine.   I have been provided ample opportunity to ask questions regarding this consent and the Services and have had my questions answered to my satisfaction.  I give my informed consent for the services to be provided through the use of telemedicine in my medical care  By participating in this telemedicine visit I agree to the above.

## 2018-08-18 ENCOUNTER — Other Ambulatory Visit: Payer: Self-pay

## 2018-08-18 ENCOUNTER — Telehealth (INDEPENDENT_AMBULATORY_CARE_PROVIDER_SITE_OTHER): Payer: Medicare Other | Admitting: Cardiovascular Disease

## 2018-08-18 VITALS — BP 134/81 | HR 94 | Ht 61.0 in | Wt 139.1 lb

## 2018-08-18 DIAGNOSIS — I2510 Atherosclerotic heart disease of native coronary artery without angina pectoris: Secondary | ICD-10-CM | POA: Diagnosis not present

## 2018-08-18 DIAGNOSIS — E782 Mixed hyperlipidemia: Secondary | ICD-10-CM | POA: Diagnosis not present

## 2018-08-18 DIAGNOSIS — I10 Essential (primary) hypertension: Secondary | ICD-10-CM | POA: Diagnosis not present

## 2018-08-18 NOTE — Progress Notes (Signed)
Virtual Visit via Video Note   This visit type was conducted due to national recommendations for restrictions regarding the COVID-19 Pandemic (e.g. social distancing) in an effort to limit this patient's exposure and mitigate transmission in our community.  Due to her co-morbid illnesses, this patient is at least at moderate risk for complications without adequate follow up.  This format is felt to be most appropriate for this patient at this time.  All issues noted in this document were discussed and addressed.  A limited physical exam was performed with this format.  Please refer to the patient's chart for her consent to telehealth for [facility].   Date:  08/18/2018   ID:  Alyssa Navarro, DOB May 25, 1950, MRN [id number]  Patient Location: Home Provider Location: Office  PCP:  Sinda Du, MD  Cardiologist:  Sanda Klein, MD  Electrophysiologist:  None   Evaluation Performed:  Follow-Up Visit  Chief Complaint:  CAD, HTN, HLP  History of Present Illness:    Alyssa Navarro is a 68 y.o. female with coronary artery disease and remote placement of a stent in 1998, without subsequent coronary events.  She takes a statin for hypercholesterolemia and a combination of amlodipine and carvedilol for high blood pressure.  She had a bad case of "bronchitis" in February with fever, generalized myalgia, diarrhea and dyspnea.  It took a long time to resolve.  She has trouble with her breathing if she goes out in the heat and is exposed to pollen, with wheezing, but improves once she comes back inside.  She does not have exertional dyspnea when doing housework.  She has been checking her blood pressure every week or so and typically the systolic is in the low-mid 073X and her diastolic is in the high 10G-YIR 80s.  Heart rate is in the 60s.  She denies angina at rest or with activity, edema, claudication, palpitations, dizziness, syncope or focal neurological events.  Overall she feels quite  well.  She just had her 68th birthday.  She lives with her daughter and 2 grandchildren.  She is practicing social distancing, avoids going out of the house if not truly necessary and wears a mask and goggles everywhere she goes.  She does not have any active symptoms to suggest coronavirus infection.  The patient does not have symptoms concerning for COVID-19 infection (fever, chills, cough, or new shortness of breath).   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 She required percutaneous revascularization in 1998 (3.5x25 NIR to mid dominant left circumflex artery, 3.0x16 NIR to distal LCX, 50-60% mid LAD untreated), but no further procedures since that time.  Past Medical History:  Diagnosis Date  . Coronary artery disease   . Hyperlipidemia   . Hypertension    Past Surgical History:  Procedure Laterality Date  . 2D Echocardiogram  12/22/2007   EF >55%, normal.  . ABDOMINAL HYSTERECTOMY    . ARTHROSCOPY WITH ANTERIOR CRUCIATE LIGAMENT (ACL) REPAIR WITH ANTERIOR TIBILIAS GRAFT Right   . CARDIAC CATHETERIZATION  02/12/1991   CAD demonstrated in Proximal LAD-50% stenosis, Distal LAD-60%, Mid Circumflex-60% stenosis, Marginal Circumflex-80% stenosis, Proximal RCA-70% stenosis  . CARDIAC CATHETERIZATION  11/12/1993   Mild-moderate 3-vessel disease-50% mid LAD, 75% mid circumflex, 80% ostial portion L circumflex.  Marland Kitchen CARDIAC CATHETERIZATION  12/03/1996   95% mid circumflex stenosis stented with a 3.5x66mm Sci-Med NIR, resulting in reduciton to 0%. 75% distal circumflex stenosis stented  with a 3.0x16mm NIR stent resulting in reduciton to 0%.  . CARDIAC CATHETERIZATION  08/23/2003   Continued medical therapy. Patent L circumflex stents placed in 1998.  . CHOLECYSTECTOMY    . COLONOSCOPY N/A 12/21/2014   Procedure: COLONOSCOPY;  Surgeon: Rogene Houston, MD;  Location[facility];  Service:  Endoscopy;  Laterality: N/A;  830  . Lexiscan Myoview  12/22/2007   No ECG changes, nondiagnostic EKG. Perfusion defect in inferior myocardial region consistent with diaphragmatic attenuation.     Current Meds  Medication Sig  . acetaminophen (TYLENOL) 325 MG tablet Take 650 mg by mouth every 6 (six) hours as needed for moderate pain.  Marland Kitchen amLODipine (NORVASC) 10 MG tablet Take 10 mg by mouth daily.  Marland Kitchen atorvastatin (LIPITOR) 40 MG tablet TAKE 1 TABLET BY MOUTH EVERY DAY AT 6 PM  . Calcium Carb-Cholecalciferol (CALTRATE 600+D3 PO) Take 1 tablet by mouth daily.  . carvedilol (COREG) 6.25 MG tablet Take 1 tablet (6.25 mg total) by mouth 2 (two) times daily.  . cholecalciferol (VITAMIN D) 1000 UNITS tablet Take 1,000 Units by mouth daily.   . clobetasol ointment (TEMOVATE) 2.99 % Apply 1 application topically daily as needed for rash.  . Coenzyme Q10 (CO Q10) 100 MG CAPS Take 200 mg by mouth daily.   Marland Kitchen ibuprofen (ADVIL,MOTRIN) 200 MG tablet Take 400 mg by mouth every 6 (six) hours as needed for moderate pain.  Marland Kitchen KRILL OIL PO Take 1 tablet by mouth daily.  . Multiple Vitamins-Minerals (CENTRUM SILVER PO) Take 1 tablet by mouth daily.  Marland Kitchen triamcinolone cream (KENALOG) 0.1 % Apply 1 application topically 2 (two) times daily.     Allergies:   Avelox [moxifloxacin hcl in nacl], Quinolones, Statins, Latex, Metoprolol, and Sulfa antibiotics   Social History   Tobacco Use  . Smoking status: Never Smoker  . Smokeless tobacco: Never Used  Substance Use Topics  . Alcohol use: No  . Drug use: No     Family Hx: The patient's family history includes Cancer in her maternal aunt and maternal grandmother; Diabetes in her maternal grandmother, mother, and another family member; Fibroids in her sister; Heart disease in her sister; Heart failure in her paternal grandmother and another family member; Hyperlipidemia in her father and mother; Hypertension in her mother, paternal grandmother, and another family  member.  ROS:   Please see the history of present illness.     All other systems reviewed and are negative.   Prior CV studies:   The following studies were reviewed today:  Labs/Other Tests and Data Reviewed:    EKG:  An ECG dated 12/10/2017 was personally reviewed today and demonstrated:  Sinus rhythm, minor nonspecific T wave inversion in leads V3-V6  Recent Labs: No results found for requested labs within last 8760 hours.   Recent Lipid Panel Lab Results  Component Value Date/Time   CHOL 429 (H) 09/22/2015 08:35 AM   TRIG 229 (H) 09/22/2015 08:35 AM   HDL 55 09/22/2015 08:35 AM   CHOLHDL 7.8 (H) 09/22/2015 08:35 AM   LDLCALC 328 (H) 09/22/2015 08:35 AM    Wt Readings from Last 3 Encounters:  08/18/18 139 lb 1.6 oz (63.1 kg)  12/09/17 133 lb (60.3 kg)  09/15/15 133 lb 3.2 oz (60.4 kg)     Objective:    Vital Signs:  BP 134/81   Pulse 94   Ht 5\' 1"  (1.549 m)   Wt 139 lb 1.6 oz (63.1 kg)   BMI 26.28  kg/m    VITAL SIGNS:  reviewed GEN:  no acute distress EYES:  sclerae anicteric, EOMI - Extraocular Movements Intact RESPIRATORY:  normal respiratory effort, symmetric expansion CARDIOVASCULAR:  no peripheral edema SKIN:  no rash, lesions or ulcers. MUSCULOSKELETAL:  no obvious deformities. NEURO:  alert and oriented x 3, no obvious focal deficit PSYCH:  normal affect  ASSESSMENT & PLAN:    1. CAD: Asymptomatic, without clinical events since the late 90s.  On aspirin.  2. HLP: Has an active order for a repeat lipid profile which he plans to do this week.  Target LDL less than 70.  3. HTN: Control is fair, although not quite perfect.  Her rash resolved after we switched from metoprolol to carvedilol.  4. Environmental allergies: Safety use Allegra, Claritin or Zyrtec or generic equivalents as long as I do not have a decongestant in the preparation.  COVID-19 Education: The signs and symptoms of COVID-19 were discussed with the patient and how to seek care for  testing (follow up with PCP or arrange E-visit).  The importance of social distancing was discussed today.  Time:   Today, I have spent 16 minutes with the patient with telehealth technology discussing the above problems.     Medication Adjustments/Labs and Tests Ordered: Current medicines are reviewed at length with the patient today.  Concerns regarding medicines are outlined above.   Tests Ordered: No orders of the defined types were placed in this encounter.   Medication Changes: No orders of the defined types were placed in this encounter.   Follow Up:  Virtual Visit or In Person 12 months  Signed, Sanda Klein, MD  08/18/2018 8:32 AM    [place]

## 2018-08-18 NOTE — Patient Instructions (Signed)
Follow-Up: You will need a follow up appointment in 12 months.  Please call our office 2 months in advance, MAY 2021 to schedule this, July 2021 appointment.  You may see Sanda Klein, MD or one of the following Advanced Practice Providers on your designated Care Team:  Almyra Deforest, PA-C  Fabian Sharp, [place]       Medication Instructions:  The current medical regimen is effective;  continue present plan and medications as directed. Please refer to the Current Medication list given to you today. If you need a refill on your cardiac medications before your next appointment, please call your pharmacy. Labwork: When you have labs (blood work) and your tests are completely normal, you will receive your results ONLY by [place] (if you have MyChart) -OR- A paper copy in the mail.  At [facility], you and your health needs are our priority.  As part of our continuing mission to provide you with exceptional heart care, we have created designated Provider Care Teams.  These Care Teams include your primary Cardiologist (physician) and Advanced Practice Providers (APPs -  Physician Assistants and Nurse Practitioners) who all work together to provide you with the care you need, when you need it.  Thank you for choosing [facility] at [facility]!!

## 2018-08-21 DIAGNOSIS — I10 Essential (primary) hypertension: Secondary | ICD-10-CM | POA: Diagnosis not present

## 2018-08-21 DIAGNOSIS — I2510 Atherosclerotic heart disease of native coronary artery without angina pectoris: Secondary | ICD-10-CM | POA: Diagnosis not present

## 2018-08-21 DIAGNOSIS — E7849 Other hyperlipidemia: Secondary | ICD-10-CM | POA: Diagnosis not present

## 2018-08-21 DIAGNOSIS — E782 Mixed hyperlipidemia: Secondary | ICD-10-CM | POA: Diagnosis not present

## 2018-08-22 LAB — CBC WITH DIFF/PLATELET
Basophils: 1
Eosinophils, %: 0
Eosinophils, %: 2
HCT: 37 (ref 29–41)
Hemoglobin: 12.8
Immature Granulocytes: 0
Lymphs Abs: 1.8
MCH: 30.1
MCHC: 34.2
MCV: 88 (ref 76–111)
Monocytes(Absolute): 0.4
Monocytes: 6
Neutro Abs: 3.7
Neutrophils: 61
RBC: 4.25
RDW: 13.6
WBC: 6
lymph#: 30
platelet count: 191

## 2018-08-22 LAB — LIPID PANEL
Cholesterol, Total: 246
HDL Cholesterol: 58 (ref 35–70)
LDL Cholesterol: 146
Triglycerides: 211 — AB (ref 40–160)
VLDL Cholesterol Cal: 42

## 2018-08-22 LAB — HEPATIC FUNCTION PANEL
ALT: 23 IU/L (ref 0–32)
AST: 23 IU/L (ref 0–40)
Albumin: 4.5 g/dL (ref 3.8–4.8)
Alkaline Phosphatase: 115 IU/L (ref 39–117)
Bilirubin Total: 0.4 mg/dL (ref 0.0–1.2)
Bilirubin, Direct: 0.08 mg/dL (ref 0.00–0.40)
Total Protein: 7.2 g/dL (ref 6.0–8.5)

## 2018-08-22 LAB — CMP 10231
ALT: 24 (ref 3–30)
AST: 25
Albumin/Globulin Ratio: 1.7
Albumin: 4.6
Alkaline Phosphatase: 117
BUN/Creatinine Ratio: 13
BUN: 9 (ref 4–21)
Calcium: 10.2
Carbon Dioxide, Total: 27
Chloride: 104
Creat: 0.71
EGFR (African American): 100
EGFR (Non-African Amer.): 87
Globulin, Total: 2.7
Glucose: 98
Potassium: 3.9
Sodium: 145
Total Bilirubin: 0.4
Total Protein: 7.3 (ref 6.4–8.2)

## 2018-08-22 LAB — TSH: TSH: 1.85

## 2018-08-24 DIAGNOSIS — Z1212 Encounter for screening for malignant neoplasm of rectum: Secondary | ICD-10-CM | POA: Diagnosis not present

## 2018-08-24 DIAGNOSIS — Z1211 Encounter for screening for malignant neoplasm of colon: Secondary | ICD-10-CM | POA: Diagnosis not present

## 2018-09-02 ENCOUNTER — Other Ambulatory Visit: Payer: Self-pay

## 2018-09-02 ENCOUNTER — Ambulatory Visit (HOSPITAL_COMMUNITY)
Admission: RE | Admit: 2018-09-02 | Discharge: 2018-09-02 | Disposition: A | Discharge status: Home / Self Care | Payer: Medicare Other | Source: Ambulatory Visit | Attending: Pulmonary Disease | Admitting: Pulmonary Disease

## 2018-09-02 DIAGNOSIS — M8589 Other specified disorders of bone density and structure, multiple sites: Secondary | ICD-10-CM | POA: Diagnosis not present

## 2018-09-02 DIAGNOSIS — Z780 Asymptomatic menopausal state: Secondary | ICD-10-CM | POA: Diagnosis not present

## 2018-09-02 LAB — HM DEXA SCAN

## 2018-09-10 ENCOUNTER — Telehealth: Payer: Self-pay | Admitting: *Deleted

## 2018-09-10 NOTE — Telephone Encounter (Signed)
-----   Message from Sanda Klein, MD sent at 09/04/2018 11:07 AM EDT ----- Reviewed labs from Dr. Luan Pulling. LDL is still very high (140), but less than half of baseline (330). This is an impressive reduction, but not truly enough. Target is under 70. Please ask if she is interested in [place] (I recommend adding it)

## 2018-09-10 NOTE — Telephone Encounter (Signed)
Patient made aware of results and verbalized understanding.  She stated that she was willing to try the [place]. Message will be sent to pharmD.

## 2018-09-16 ENCOUNTER — Telehealth: Payer: Self-pay

## 2018-09-16 MED ORDER — REPATHA SURECLICK 140 MG/ML ~~LOC~~ SOAJ: 140.0000 mg | SUBCUTANEOUS | 6 refills | Status: DC

## 2018-09-16 NOTE — Telephone Encounter (Signed)
Called the pt to let them know that they are approved for the repatha and that the rx was sent to the pharmacy pt voiced compliance.

## 2018-10-28 DIAGNOSIS — Z23 Encounter for immunization: Secondary | ICD-10-CM | POA: Diagnosis not present

## 2018-11-27 ENCOUNTER — Other Ambulatory Visit: Payer: Self-pay

## 2018-11-27 MED ORDER — CARVEDILOL 6.25 MG PO TABS: 6.2500 mg | ORAL_TABLET | Freq: Two times a day (BID) | ORAL | 3 refills | Status: DC

## 2018-12-28 ENCOUNTER — Other Ambulatory Visit: Payer: Self-pay

## 2019-02-16 ENCOUNTER — Ambulatory Visit: Payer: Medicare Other | Attending: Internal Medicine

## 2019-02-16 ENCOUNTER — Other Ambulatory Visit: Payer: Self-pay

## 2019-02-16 DIAGNOSIS — Z20822 Contact with and (suspected) exposure to covid-19: Secondary | ICD-10-CM

## 2019-02-17 LAB — NOVEL CORONAVIRUS, NAA: SARS-CoV-2, NAA: NOT DETECTED

## 2019-03-13 ENCOUNTER — Ambulatory Visit: Payer: Medicare Other | Attending: Internal Medicine

## 2019-03-13 DIAGNOSIS — Z23 Encounter for immunization: Secondary | ICD-10-CM | POA: Insufficient documentation

## 2019-03-13 NOTE — Progress Notes (Signed)
   Covid-19 Vaccination Clinic  Name:  Alyssa Navarro    MRN: [id number] DOB: 1950-10-19  03/13/2019  [name] was observed post Covid-19 immunization for 15 minutes without incidence. She was provided with Vaccine Information Sheet and instruction to access the V-Safe system.   [name] was instructed to call 911 with any severe reactions post vaccine: Marland Kitchen Difficulty breathing  . Swelling of your face and throat  . A fast heartbeat  . A bad rash all over your body  . Dizziness and weakness    Immunizations Administered    Name Date Dose VIS Date Route   Moderna COVID-19 Vaccine 03/13/2019  1:06 PM 0.5 mL 01/05/2019 Intramuscular   Manufacturer: Moderna   Lot: [id number]   [id number]

## 2019-03-15 ENCOUNTER — Other Ambulatory Visit: Payer: Self-pay

## 2019-03-15 MED ORDER — REPATHA SURECLICK 140 MG/ML ~~LOC~~ SOAJ: 140.0000 mg | SUBCUTANEOUS | 11 refills | Status: DC

## 2019-04-13 ENCOUNTER — Ambulatory Visit: Payer: Medicare Other | Attending: Internal Medicine

## 2019-04-13 DIAGNOSIS — Z23 Encounter for immunization: Secondary | ICD-10-CM

## 2019-04-13 NOTE — Progress Notes (Signed)
   Covid-19 Vaccination Clinic  Name:  TIFFONY CLARIZIO    MRN: [id number] DOB: 05-10-1950  04/13/2019  Ms. Senters was observed post Covid-19 immunization for 15 minutes without incident. She was provided with Vaccine Information Sheet and instruction to access the V-Safe system.   Ms. Krider was instructed to call 911 with any severe reactions post vaccine: Marland Kitchen Difficulty breathing  . Swelling of face and throat  . A fast heartbeat  . A bad rash all over body  . Dizziness and weakness   Immunizations Administered    Name Date Dose VIS Date Route   Moderna COVID-19 Vaccine 04/13/2019 11:07 AM 0.5 mL 01/05/2019 Intramuscular   Manufacturer: Moderna   Lot: [id number]   [place]:[id number]

## 2019-06-02 ENCOUNTER — Encounter: Payer: Self-pay | Admitting: Family Medicine

## 2019-06-02 ENCOUNTER — Other Ambulatory Visit: Payer: Self-pay

## 2019-06-02 ENCOUNTER — Ambulatory Visit (INDEPENDENT_AMBULATORY_CARE_PROVIDER_SITE_OTHER): Payer: Medicare Other | Admitting: Family Medicine

## 2019-06-02 VITALS — BP 140/82 | HR 86 | Temp 97.6°F | Ht 61.0 in | Wt 140.8 lb

## 2019-06-02 DIAGNOSIS — E782 Mixed hyperlipidemia: Secondary | ICD-10-CM | POA: Diagnosis not present

## 2019-06-02 DIAGNOSIS — M8580 Other specified disorders of bone density and structure, unspecified site: Secondary | ICD-10-CM

## 2019-06-02 DIAGNOSIS — I10 Essential (primary) hypertension: Secondary | ICD-10-CM | POA: Diagnosis not present

## 2019-06-02 NOTE — Progress Notes (Signed)
New Patient Office Visit  Subjective:  Patient ID: Alyssa Navarro, female    DOB: [birth year]  Age: 69 y.o. MRN: [id number]  CC:  Chief Complaint  Patient presents with  . Establish Care    here to est care for 6 month f/u    HPI Alyssa Navarro presents for HTN-h/o cath with stents-amlodipine 10mg /Carvediolol 6.25mg  BID-eating healthy Hyperlipidemia-atorvastatin/Repatha-cardio following-yearly evaluation  Past Medical History:  Diagnosis Date  . Allergy   . Anemia   . Coronary artery disease   . Hyperlipidemia   . Hypertension     Past Surgical History:  Procedure Laterality Date  . 2D Echocardiogram  12/22/2007   EF >55%, normal.  . ABDOMINAL HYSTERECTOMY    . APPENDECTOMY    . ARTHROSCOPY WITH ANTERIOR CRUCIATE LIGAMENT (ACL) REPAIR WITH ANTERIOR TIBILIAS GRAFT Right   . CARDIAC CATHETERIZATION  02/12/1991   CAD demonstrated in Proximal LAD-50% stenosis, Distal LAD-60%, Mid Circumflex-60% stenosis, Marginal Circumflex-80% stenosis, Proximal RCA-70% stenosis  . CARDIAC CATHETERIZATION  11/12/1993   Mild-moderate 3-vessel disease-50% mid LAD, 75% mid circumflex, 80% ostial portion L circumflex.  Marland Kitchen CARDIAC CATHETERIZATION  12/03/1996   95% mid circumflex stenosis stented with a 3.5x26mm Sci-Med NIR, resulting in reduciton to 0%. 75% distal circumflex stenosis stented with a 3.0x19mm NIR stent resulting in reduciton to 0%.  . CARDIAC CATHETERIZATION  08/23/2003   Continued medical therapy. Patent L circumflex stents placed in 1998.  . CHOLECYSTECTOMY    . COLONOSCOPY N/A 12/21/2014   Procedure: COLONOSCOPY;  Surgeon: Rogene Houston, MD;  Location[facility];  Service: Endoscopy;  Laterality: N/A;  830  . Lexiscan Myoview  12/22/2007   No ECG changes, nondiagnostic EKG. Perfusion defect in inferior myocardial region consistent with diaphragmatic attenuation.  . TUBAL LIGATION      Family History  Problem Relation Age of Onset  . Diabetes Mother   . Hypertension  Mother   . Hyperlipidemia Mother   . Heart failure Other   . Hypertension Other   . Diabetes Other   . Hyperlipidemia Father   . Heart disease Sister   . Cancer Maternal Aunt        breast  . Cancer Maternal Grandmother        breast  . Diabetes Maternal Grandmother   . Hypertension Paternal Grandmother   . Heart failure Paternal Grandmother   . Fibroids Sister     Social History   Socioeconomic History  . Marital status: Divorced    Spouse name: Not on file  . Number of children: Not on file  . Years of education: Not on file  . Highest education level: Not on file  Occupational History  . Not on file  Tobacco Use  . Smoking status: Never Smoker  . Smokeless tobacco: Never Used  Substance and Sexual Activity  . Alcohol use: No  . Drug use: No  . Sexual activity: Yes    Birth control/protection: Surgical  Other Topics Concern  . Not on file  Social History Narrative  . Not on file   Social Determinants of Health   Financial Resource Strain:   . Difficulty of Paying Living Expenses:   Food Insecurity:   . Worried About Charity fundraiser in the Last Year:   . Arboriculturist in the Last Year:   Transportation Needs:   . Film/video editor (Medical):   Marland Kitchen Lack of Transportation (Non-Medical):   Physical Activity:   . Days of  Exercise per Week:   . Minutes of Exercise per Session:   Stress:   . Feeling of Stress :   Social Connections:   . Frequency of Communication with Friends and Family:   . Frequency of Social Gatherings with Friends and Family:   . Attends Religious Services:   . Active Member of Clubs or Organizations:   . Attends Archivist Meetings:   Marland Kitchen Marital Status:   Intimate Partner Violence:   . Fear of Current or Ex-Partner:   . Emotionally Abused:   Marland Kitchen Physically Abused:   . Sexually Abused:     ROS Review of Systems  Constitutional: Positive for activity change and fatigue.  HENT: Positive for rhinorrhea.   Eyes:  Negative.   Respiratory: Negative.   Cardiovascular: Negative.        H/o stents-no CP  Gastrointestinal:       Colonoscopy -polyps  Musculoskeletal: Positive for arthralgias and myalgias.       Osteopenia-DEXA 2020  Allergic/Immunologic: Positive for environmental allergies.  Psychiatric/Behavioral: Positive for sleep disturbance.    Objective:   Today's Vitals: BP 140/82 (BP Location: Right Arm, Patient Position: Sitting, Cuff Size: Normal)   Pulse 86   Temp 97.6 F (36.4 C) (Temporal)   Ht 5\' 1"  (1.549 m)   Wt 140 lb 12.8 oz (63.9 kg)   SpO2 96%   BMI 26.60 kg/m   Physical Exam Constitutional:      Appearance: Normal appearance. She is normal weight.  HENT:     Head: Normocephalic and atraumatic.  Cardiovascular:     Rate and Rhythm: Normal rate and regular rhythm.     Pulses: Normal pulses.     Heart sounds: Normal heart sounds.  Pulmonary:     Effort: Pulmonary effort is normal.     Breath sounds: Normal breath sounds.  Musculoskeletal:        General: Normal range of motion.     Cervical back: Normal range of motion and neck supple.  Skin:    General: Skin is warm and dry.  Neurological:     Mental Status: She is alert and oriented to person, place, and time.  Psychiatric:        Mood and Affect: Mood normal.        Behavior: Behavior normal.     Assessment & Plan:  1. Essential hypertension Continue coregBID, amlodipine daily - Lipid panel - COMPLETE METABOLIC PANEL WITH GFR - Microalbumin, urine  2. Mixed hyperlipidemia Atorvastatin, coq10 - Lipid panel 3. Osteopenia, unspecified location Calcium, Vit D Outpatient Encounter Medications as of 06/02/2019  Medication Sig  . acetaminophen (TYLENOL) 325 MG tablet Take 650 mg by mouth every 6 (six) hours as needed for moderate pain.  Marland Kitchen amLODipine (NORVASC) 10 MG tablet Take 10 mg by mouth daily.  Marland Kitchen atorvastatin (LIPITOR) 40 MG tablet TAKE 1 TABLET BY MOUTH EVERY DAY AT 6 PM  . Calcium  Carb-Cholecalciferol (CALTRATE 600+D3 PO) Take 1 tablet by mouth daily.  . carvedilol (COREG) 6.25 MG tablet Take 1 tablet (6.25 mg total) by mouth 2 (two) times daily.  . cholecalciferol (VITAMIN D) 1000 UNITS tablet Take 1,000 Units by mouth daily.   . clobetasol ointment (TEMOVATE) [id number] % Apply 1 application topically daily as needed for rash.  . Coenzyme Q10 (CO Q10) 100 MG CAPS Take 200 mg by mouth daily.   . Evolocumab (REPATHA SURECLICK) [id number] MG/ML SOAJ Inject 140 mg into the skin every 14 (fourteen) days.  Marland Kitchen  ibuprofen (ADVIL,MOTRIN) 200 MG tablet Take 400 mg by mouth every 6 (six) hours as needed for moderate pain.  Marland Kitchen KRILL OIL PO Take 1 tablet by mouth daily.  . Multiple Vitamins-Minerals (CENTRUM SILVER PO) Take 1 tablet by mouth daily.  Marland Kitchen triamcinolone cream (KENALOG) 0.1 % Apply 1 application topically 2 (two) times daily.   No facility-administered encounter medications on file as of 06/02/2019.    Follow-up:   Monterius Rolf Hannah Beat, MD

## 2019-06-02 NOTE — Patient Instructions (Addendum)
   Fasting labwork If you have lab work done today you will be contacted with your lab results within the next 2 weeks.  If you have not heard from us then please contact us. The fastest way to get your results is to register for My Chart.   IF you received an x-ray today, you will receive an invoice from [facility]. Please contact [facility] at [phone] with questions or concerns regarding your invoice.   IF you received labwork today, you will receive an invoice from [facility]. Please contact [facility] at [phone] with questions or concerns regarding your invoice.   Our billing staff will not be able to assist you with questions regarding bills from these companies.  You will be contacted with the lab results as soon as they are available. The fastest way to get your results is to activate your My Chart account. Instructions are located on the last page of this paperwork. If you have not heard from us regarding the results in 2 weeks, please contact this office.      

## 2019-06-17 ENCOUNTER — Ambulatory Visit: Payer: Medicare Other | Admitting: Family Medicine

## 2019-06-21 ENCOUNTER — Other Ambulatory Visit: Payer: Self-pay | Admitting: Family Medicine

## 2019-06-21 DIAGNOSIS — I10 Essential (primary) hypertension: Secondary | ICD-10-CM | POA: Diagnosis not present

## 2019-06-21 DIAGNOSIS — E782 Mixed hyperlipidemia: Secondary | ICD-10-CM | POA: Diagnosis not present

## 2019-06-22 LAB — COMPREHENSIVE METABOLIC PANEL
ALT: 29 IU/L (ref 0–32)
AST: 23 IU/L (ref 0–40)
Albumin/Globulin Ratio: 2 (ref 1.2–2.2)
Albumin: 4.5 g/dL (ref 3.8–4.8)
Alkaline Phosphatase: 128 IU/L — ABNORMAL HIGH (ref 48–121)
BUN/Creatinine Ratio: 20 (ref 12–28)
BUN: 15 mg/dL (ref 8–27)
Bilirubin Total: 0.6 mg/dL (ref 0.0–1.2)
CO2: 22 mmol/L (ref 20–29)
Calcium: 10 mg/dL (ref 8.7–10.3)
Chloride: 103 mmol/L (ref 96–106)
Creatinine, Ser: 0.76 mg/dL (ref 0.57–1.00)
GFR calc Af Amer: 92 mL/min/{1.73_m2} (ref >59)
GFR calc non Af Amer: 80 mL/min/{1.73_m2} (ref >59)
Globulin, Total: 2.3 g/dL (ref 1.5–4.5)
Glucose: 100 mg/dL — ABNORMAL HIGH (ref 65–99)
Potassium: 3.8 mmol/L (ref 3.5–5.2)
Sodium: 142 mmol/L (ref 134–144)
Total Protein: 6.8 g/dL (ref 6.0–8.5)

## 2019-06-22 LAB — MICROALBUMIN, URINE: Microalbumin, Urine: 13 ug/mL

## 2019-06-22 LAB — LIPID PANEL W/O CHOL/HDL RATIO
Cholesterol, Total: 140 mg/dL (ref 100–199)
HDL: 60 mg/dL (ref >39)
LDL Chol Calc (NIH): 60 mg/dL (ref 0–99)
Triglycerides: 109 mg/dL (ref 0–149)
VLDL Cholesterol Cal: 20 mg/dL (ref 5–40)

## 2019-06-22 LAB — SPECIMEN STATUS REPORT

## 2019-08-10 ENCOUNTER — Encounter: Payer: Self-pay | Admitting: Family Medicine

## 2019-08-10 ENCOUNTER — Ambulatory Visit (INDEPENDENT_AMBULATORY_CARE_PROVIDER_SITE_OTHER): Payer: Medicare Other | Admitting: Family Medicine

## 2019-08-10 ENCOUNTER — Other Ambulatory Visit: Payer: Self-pay

## 2019-08-10 VITALS — BP 140/80 | HR 93 | Temp 97.6°F | Resp 16 | Ht 61.0 in | Wt 138.8 lb

## 2019-08-10 DIAGNOSIS — I10 Essential (primary) hypertension: Secondary | ICD-10-CM

## 2019-08-10 DIAGNOSIS — E559 Vitamin D deficiency, unspecified: Secondary | ICD-10-CM

## 2019-08-10 DIAGNOSIS — E782 Mixed hyperlipidemia: Secondary | ICD-10-CM | POA: Diagnosis not present

## 2019-08-10 NOTE — Assessment & Plan Note (Signed)
She reports taking her medications as directed.  Has follow-up with cardiology here in the near future.  Continue all current medications as directed.  She is on the higher end of normal.  However she reports that she checks her blood pressure at home and it is normal.  She thinks she might have a little bit of whitecoat syndrome.

## 2019-08-10 NOTE — Progress Notes (Signed)
Subjective:  Patient ID: Alyssa Navarro, female    DOB: 1950-04-10  Age: 69 y.o. MRN: [id number]  CC:  Chief Complaint  Patient presents with  . New Patient (Initial Visit)    new pt former dr Navarro/ dr Luan Navarro pt no other concerns       HPI  HPI Alyssa Navarro is a pleasant 69 year old female patient here to establish care.  Previous patient Alyssa Navarro. History includes asthma, coronary artery disease, hyperlipidemia, hypertension.  She reports taking her medications without any issues or concerns. She does not have any chest pain, shortness of breath, palpitations, leg swelling.  She reports some changes in her sleep recently.  Unsure what is related to.  Denies having any trouble chewing, swallowing or dentition problems does see the dentist regularly.  Denies having any changes in bowel or bladder habits.  Denies having any blood in urine or stool.  Denies having any leakage of urine.  Denies having any significant memory issues or concerns.  Denies having any history of falls recently.  Denies having any skin issues recently.  Denies having any vision changes.  Reports that she thinks she gets a little bit of whitecoat syndrome as when she checks her blood pressure at home her blood pressure is not as elevated as it is when she is in the office.  She reports that she is done the Cologuard and everything came back okay with this.  Mammogram ordered today.  She does see cardiology yearly for follow-up.  Did have elevated cholesterol is now on Repatha.  In addition to Lipitor.  Overall recent labs looked good. There was question is whether or not her liver function was changing secondary to increasing over the last several years.  She denies having any extensive Tylenol or alcohol use.  She has no abdominal pain. She has all her vaccines, and completed her Covid series.   Today patient denies signs and symptoms of COVID 19 infection including fever, chills, cough, shortness of breath, and  headache. Past Medical, Surgical, Social History, Allergies, and Medications have been Reviewed.   Past Medical History:  Diagnosis Date  . Allergy   . Anemia   . Coronary artery disease   . Hyperlipidemia   . Hypertension   . Screening for malignant neoplasm of the cervix 06/23/2013  . Trichimoniasis 07/19/2013    Current Meds  Medication Sig  . acetaminophen (TYLENOL) 325 MG tablet Take 650 mg by mouth every 6 (six) hours as needed for moderate pain.  Marland Kitchen amLODipine (NORVASC) 10 MG tablet Take 10 mg by mouth daily.  Marland Kitchen atorvastatin (LIPITOR) 40 MG tablet TAKE 1 TABLET BY MOUTH EVERY DAY AT 6 PM  . Calcium Carb-Cholecalciferol (CALTRATE 600+D3 PO) Take 1 tablet by mouth daily.  . carvedilol (COREG) 6.25 MG tablet Take 1 tablet (6.25 mg total) by mouth 2 (two) times daily.  . cholecalciferol (VITAMIN D) 1000 UNITS tablet Take 1,000 Units by mouth daily.   . clobetasol ointment (TEMOVATE) 5.09 % Apply 1 application topically daily as needed for rash.  . Coenzyme Q10 (CO Q10) 100 MG CAPS Take 200 mg by mouth daily.   . Evolocumab (REPATHA SURECLICK) 326 MG/ML SOAJ Inject 140 mg into the skin every 14 (fourteen) days.  Marland Kitchen ibuprofen (ADVIL,MOTRIN) 200 MG tablet Take 400 mg by mouth every 6 (six) hours as needed for moderate pain.  Marland Kitchen KRILL OIL PO Take 1 tablet by mouth daily.  . Multiple Vitamins-Minerals (CENTRUM SILVER PO) Take  1 tablet by mouth daily.  Marland Kitchen triamcinolone cream (KENALOG) 0.1 % Apply 1 application topically 2 (two) times daily.    ROS:  Review of Systems  Constitutional: Negative.   HENT: Negative.   Eyes: Negative.   Respiratory: Negative.   Cardiovascular: Negative.   Gastrointestinal: Negative.   Genitourinary: Negative.   Musculoskeletal: Negative.   Skin: Negative.   Neurological: Negative.   Endo/Heme/Allergies: Negative.   Psychiatric/Behavioral: Negative.   All other systems reviewed and are negative.    Objective:   Today's Vitals: BP 140/80 (BP  Location: Right Arm, Patient Position: Sitting, Cuff Size: Normal)   Pulse 93   Temp 97.6 F (36.4 C) (Temporal)   Resp 16   Ht 5\' 1"  (1.549 m)   Wt 138 lb 12.8 oz (63 kg)   SpO2 95%   BMI 26.23 kg/m  Vitals with BMI 08/10/2019 06/02/2019 08/18/2018  Height 5\' 1"  5\' 1"  5\' 1"   Weight 138 lbs 13 oz 140 lbs 13 oz 139 lbs 2 oz  BMI 26.24 02.40 97.3  Systolic 532 992 426  Diastolic 80 82 81  Pulse 93 86 94     Physical Exam Vitals and nursing note reviewed.  Constitutional:      Appearance: Normal appearance. She is well-developed, well-groomed and overweight.  HENT:     Head: Normocephalic and atraumatic.     Right Ear: External ear normal.     Left Ear: External ear normal.     Mouth/Throat:     Comments: Mask in place  Eyes:     General:        Right eye: No discharge.        Left eye: No discharge.     Conjunctiva/sclera: Conjunctivae normal.  Cardiovascular:     Rate and Rhythm: Normal rate and regular rhythm.     Pulses: Normal pulses.     Heart sounds: Normal heart sounds.  Pulmonary:     Effort: Pulmonary effort is normal.     Breath sounds: Normal breath sounds.  Musculoskeletal:        General: Normal range of motion.     Cervical back: Normal range of motion and neck supple.  Skin:    General: Skin is warm.  Neurological:     General: No focal deficit present.     Mental Status: She is alert and oriented to person, place, and time.  Psychiatric:        Attention and Perception: Attention normal.        Mood and Affect: Mood normal.        Speech: Speech normal.        Behavior: Behavior normal. Behavior is cooperative.        Thought Content: Thought content normal.        Cognition and Memory: Cognition normal.        Judgment: Judgment normal.     Comments: Pleasant in conversation good communication.  Good eye contact.      Assessment   1. Essential hypertension   2. Mixed hyperlipidemia   3. Vitamin D deficiency     Tests ordered Orders  Placed This Encounter  Procedures  . CBC  . Lipid panel  . TSH  . VITAMIN D 25 Hydroxy (Vit-D Deficiency, Fractures)  . Comprehensive Metabolic Panel (CMET)     Plan: Please see assessment and plan per problem list above.   No orders of the defined types were placed in this encounter.   Patient to  follow-up in 3 months .  Perlie Mayo, NP

## 2019-08-10 NOTE — Patient Instructions (Addendum)
I appreciate the opportunity to provide you with care for your health and wellness. Today we discussed: Establish care  Follow up: 3 months for annual  Labs fasting a few days before Referrals today- none  Great to meet you!  Please continue to practice social distancing to keep you, your family, and our community safe.  If you must go out, please wear a mask and practice good handwashing.  It was a pleasure to see you and I look forward to continuing to work together on your health and well-being. Please do not hesitate to call the office if you need care or have questions about your care.  Have a wonderful day and week. With Gratitude, Cherly Beach, DNP, AGNP-BC

## 2019-08-10 NOTE — Assessment & Plan Note (Signed)
She is followed extensively by cardiology.  Heart healthy low-fat diet is encouraged.  She reports she changed her diet most recently since retiring.  Overall her labs have responded very well.  Continue current medications as directed by cardiology.

## 2019-08-12 ENCOUNTER — Encounter: Payer: Self-pay | Admitting: Family Medicine

## 2019-08-16 ENCOUNTER — Other Ambulatory Visit: Payer: Self-pay | Admitting: Family Medicine

## 2019-08-16 DIAGNOSIS — Z1231 Encounter for screening mammogram for malignant neoplasm of breast: Secondary | ICD-10-CM

## 2019-08-19 ENCOUNTER — Encounter: Payer: Self-pay | Admitting: Cardiovascular Disease

## 2019-08-19 ENCOUNTER — Ambulatory Visit (INDEPENDENT_AMBULATORY_CARE_PROVIDER_SITE_OTHER): Payer: Medicare Other | Admitting: Cardiovascular Disease

## 2019-08-19 ENCOUNTER — Other Ambulatory Visit: Payer: Self-pay

## 2019-08-19 VITALS — BP 167/87 | HR 73 | Ht 61.0 in | Wt 141.0 lb

## 2019-08-19 DIAGNOSIS — E782 Mixed hyperlipidemia: Secondary | ICD-10-CM

## 2019-08-19 DIAGNOSIS — I2510 Atherosclerotic heart disease of native coronary artery without angina pectoris: Secondary | ICD-10-CM

## 2019-08-19 DIAGNOSIS — I10 Essential (primary) hypertension: Secondary | ICD-10-CM

## 2019-08-19 DIAGNOSIS — R0989 Other specified symptoms and signs involving the circulatory and respiratory systems: Secondary | ICD-10-CM | POA: Diagnosis not present

## 2019-08-19 MED ORDER — AMLODIPINE BESYLATE 10 MG PO TABS: 10.0000 mg | ORAL_TABLET | Freq: Every day | ORAL | 3 refills | Status: DC

## 2019-08-19 NOTE — Patient Instructions (Signed)
Medication Instructions:  No changes *If you need a refill on your cardiac medications before your next appointment, please call your pharmacy*   Lab Work: None ordered If you have labs (blood work) drawn today and your tests are completely normal, you will receive your results only by: Marland Kitchen MyChart Message (if you have MyChart) OR . A paper copy in the mail If you have any lab test that is abnormal or we need to change your treatment, we will call you to review the results.   Testing/Procedures: Your physician has requested that you have a carotid duplex. This test is an ultrasound of the carotid arteries in your neck. It looks at blood flow through these arteries that supply the brain with blood. Allow one hour for this exam. There are no restrictions or special instructions.   Follow-Up: At [facility], you and your health needs are our priority.  As part of our continuing mission to provide you with exceptional heart care, we have created designated Provider Care Teams.  These Care Teams include your primary Cardiologist (physician) and Advanced Practice Providers (APPs -  Physician Assistants and Nurse Practitioners) who all work together to provide you with the care you need, when you need it.  We recommend signing up for the patient portal called "MyChart".  Sign up information is provided on this After Visit Summary.  MyChart is used to connect with patients for Virtual Visits (Telemedicine).  Patients are able to view lab/test results, encounter notes, upcoming appointments, etc.  Non-urgent messages can be sent to your provider as well.   To learn more about what you can do with MyChart, go to [email or web address].    Your next appointment:   12 month(s)  The format for your next appointment:   In Person  Provider:   You may see Sanda Klein, MD or one of the following Advanced Practice Providers on your designated Care Team:    Almyra Deforest, PA-C  Fabian Sharp, PA-C or    Roby Lofts, [place]

## 2019-08-19 NOTE — Progress Notes (Addendum)
Cardiology Office Note:    Date:  08/20/2019    ID:  Alyssa Navarro, DOB Oct 26, 1950, MRN [id number]  PCP:  Perlie Mayo, NP  [facility] Cardiologist:  Sanda Klein, MD  [facility] Electrophysiologist:  None   Referring MD: Sinda Du, MD   CC: CAD follow up   History of Present Illness:    Alyssa Navarro is a 69 y.o. female with a hx of HTN on Coreg and Amlodipine, CAD with stent placement in 1998, [place] who presents today for a 1 year follow up of CAD.   Alyssa Navarro states she has been doing well over the past year. She denies any chest pain, palpitations, SOB, leg swelling, claudication, dizziness. Symptoms are also absent while exerting herself, such as during gardening or walking up a slanted yard. She is able to perform all of her own ADLs without assistance. She has been working on her diet to include chicken, salmon, leafy greens. She is happy with her cholesterol improvement since starting Repatha. She has had to decrease her activity outside since her allergies have been bothering her.   She is surprised to see her BP is elevated at this visit (167/87), stating that her BP at home tends to be closer to normal.   Of note, Alyssa Navarro states her PCP is concerned regarding her LFTs, as they have been apparently steadily rising over the years. Patient was reassured Repatha is unlikely to be causing this. Additionally, she is off statins now.   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 She required percutaneous revascularization in 1998 (3.5x25 NIR to mid dominant left circumflex artery, 3.0x16 NIR to distal LCX, 50-60% mid LAD untreated), but no further proceduressince that time.  Past Medical History:  Diagnosis Date  . Allergy   . Anemia   . Coronary artery disease   . Hyperlipidemia   . Hypertension   . Screening for malignant neoplasm of the cervix 06/23/2013  .  Trichimoniasis 07/19/2013    Past Surgical History:  Procedure Laterality Date  . 2D Echocardiogram  12/22/2007   EF >55%, normal.  . ABDOMINAL HYSTERECTOMY    . APPENDECTOMY    . ARTHROSCOPY WITH ANTERIOR CRUCIATE LIGAMENT (ACL) REPAIR WITH ANTERIOR TIBILIAS GRAFT Right 2007  . CARDIAC CATHETERIZATION  02/12/1991   CAD demonstrated in Proximal LAD-50% stenosis, Distal LAD-60%, Mid Circumflex-60% stenosis, Marginal Circumflex-80% stenosis, Proximal RCA-70% stenosis  . CARDIAC CATHETERIZATION  11/12/1993   Mild-moderate 3-vessel disease-50% mid LAD, 75% mid circumflex, 80% ostial portion L circumflex.  Marland Kitchen CARDIAC CATHETERIZATION  12/03/1996   95% mid circumflex stenosis stented with a 3.5x53mm Sci-Med NIR, resulting in reduciton to 0%. 75% distal circumflex stenosis stented with a 3.0x15mm NIR stent resulting in reduciton to 0%.  . CARDIAC CATHETERIZATION  08/23/2003   Continued medical therapy. Patent L circumflex stents placed in 1998.  . CHOLECYSTECTOMY    . COLONOSCOPY N/A 12/21/2014   Procedure: COLONOSCOPY;  Surgeon: Rogene Houston, MD;  Location: [facility];  Service: Endoscopy;  Laterality: N/A;  830  . Lexiscan Myoview  12/22/2007   No ECG changes, nondiagnostic EKG. Perfusion defect in inferior myocardial region consistent with diaphragmatic attenuation.  . TUBAL LIGATION      Current Medications: Current Meds  Medication Sig  . acetaminophen (TYLENOL) 325 MG tablet Take 650 mg by mouth every 6 (six) hours as needed for moderate pain.  Marland Kitchen amLODipine (NORVASC) 10  MG tablet Take 1 tablet (10 mg total) by mouth daily.  . Calcium Carb-Cholecalciferol (CALTRATE 600+D3 PO) Take 1 tablet by mouth daily.  . carvedilol (COREG) 6.25 MG tablet Take 1 tablet (6.25 mg total) by mouth 2 (two) times daily.  . cholecalciferol (VITAMIN D) 1000 UNITS tablet Take 1,000 Units by mouth daily.   . clobetasol ointment (TEMOVATE) 1.96 % Apply 1 application topically daily as needed for rash.  .  Coenzyme Q10 (CO Q10) 100 MG CAPS Take 200 mg by mouth daily.   . Evolocumab (REPATHA SURECLICK) 222 MG/ML SOAJ Inject 140 mg into the skin every 14 (fourteen) days.  Marland Kitchen ibuprofen (ADVIL,MOTRIN) 200 MG tablet Take 400 mg by mouth every 6 (six) hours as needed for moderate pain.  . Multiple Vitamins-Minerals (CENTRUM SILVER PO) Take 1 tablet by mouth daily.  . [DISCONTINUED] amLODipine (NORVASC) 10 MG tablet Take 10 mg by mouth daily.  . [DISCONTINUED] atorvastatin (LIPITOR) 40 MG tablet TAKE 1 TABLET BY MOUTH EVERY DAY AT 6 PM    Allergies:   Avelox [moxifloxacin hcl in nacl], Quinolones, Statins, Latex, Metoprolol, and Sulfa antibiotics   Social History   Socioeconomic History  . Marital status: Divorced    Spouse name: Not on file  . Number of children: Not on file  . Years of education: Not on file  . Highest education level: Not on file  Occupational History  . Not on file  Tobacco Use  . Smoking status: Never Smoker  . Smokeless tobacco: Never Used  Substance and Sexual Activity  . Alcohol use: No  . Drug use: No  . Sexual activity: Yes    Birth control/protection: Surgical  Other Topics Concern  . Not on file  Social History Narrative  . Not on file   Social Determinants of Health   Financial Resource Strain: Low Risk   . Difficulty of Paying Living Expenses: Not hard at all  Food Insecurity: No Food Insecurity  . Worried About Charity fundraiser in the Last Year: Never true  . Ran Out of Food in the Last Year: Never true  Transportation Needs: No Transportation Needs  . Lack of Transportation (Medical): No  . Lack of Transportation (Non-Medical): No  Physical Activity: Inactive  . Days of Exercise per Week: 0 days  . Minutes of Exercise per Session: 0 min  Stress: No Stress Concern Present  . Feeling of Stress : Only a little  Social Connections: Unknown  . Frequency of Communication with Friends and Family: More than three times a week  . Frequency of Social  Gatherings with Friends and Family: More than three times a week  . Attends Religious Services: More than 4 times per year  . Active Member of Clubs or Organizations: No  . Attends Archivist Meetings: Never  . Marital Status: Not on file     Family History: The patient's family history includes Cancer in her maternal aunt and maternal grandmother; Diabetes in her maternal grandmother, mother, and another family member; Fibroids in her sister; Heart disease in her sister; Heart failure in her paternal grandmother and another family member; Hyperlipidemia in her father and mother; Hypertension in her mother, paternal grandmother, and another family member.  ROS:   Please see the history of present illness.    All other systems reviewed and are negative.  EKGs/Labs/Other Studies Reviewed:    EKG:  EKG ordered today showed sinus rhythm with rate of 63. No axis deviation. No ST or  T wave abnormalities noted.   Recent Labs: 08/21/2018: TSH 1.850 06/21/2019: ALT 29; BUN 15; Creatinine, Ser 0.76; Potassium 3.8; Sodium 142  Recent Lipid Panel    Component Value Date/Time   CHOL 140 06/21/2019 0820   CHOL 246 08/21/2018 0910   TRIG 109 06/21/2019 0820   TRIG 211 (A) 08/21/2018 0910   HDL 60 06/21/2019 0820   HDL 58 08/21/2018 0910   CHOLHDL 4.0 03/21/2017 0743   VLDL 46 (H) 09/22/2015 0835   LDLCALC 60 06/21/2019 0820    Physical Exam:    VS:  BP (!) 167/87   Pulse 73   Ht 5\' 1"  (1.549 m)   Wt 141 lb (64 kg)   SpO2 99%   BMI 26.64 kg/m     Wt Readings from Last 3 Encounters:  08/19/19 141 lb (64 kg)  08/10/19 138 lb 12.8 oz (63 kg)  06/02/19 140 lb 12.8 oz (63.9 kg)    GEN: Well nourished, well developed in no acute distress HEENT: Normal NECK: No JVD; New right carotid bruit noted LYMPHATICS: No lymphadenopathy CARDIAC: RRR, no murmurs, rubs, gallops RESPIRATORY:  Clear to auscultation without rales, wheezing or rhonchi  ABDOMEN: Soft, non-tender,  non-distended MUSCULOSKELETAL:  No edema; No deformity  SKIN: Warm and dry NEUROLOGIC:  Alert and oriented x 3 PSYCHIATRIC:  Normal affect   ASSESSMENT:    1. Right carotid bruit   2. Essential hypertension   3. Mixed hyperlipidemia   4. Coronary artery disease involving native coronary artery of native heart without angina pectoris    PLAN:    In order of problems listed above:  1. Right Carotid Bruit: New right carotid bruit noted on examination today. Unfortunately, timing of development is difficult to pinpoint as last visit was via telephone due to COVID-19. Not present on examination in 2019 and no prior carotid ultrasounds. She is asymptomatic though, which is reassuring. Will schedule bilateral carotid ultrasound for further evaluation. Patient was counseled on symptoms to prompt immediate evaluation, include unilateral weakness, vision changes, dizziness.  2. HTN: Elevated at visit today but patient states this is inconsistent with her home reads. Unfortunately, she does not have those with her today. Will continue with current medication regimen that includes Amlodipine and Carvedilol.  3. HLP: Well controlled at this time with the addition of Repatha. Patient is tolerating well. No longer on Atorvastatin.  4. CAD: No anginal symptoms and EKG without changes. Will continue with risk management, including BP and cholesterol control.   Medication Adjustments/Labs and Tests Ordered: Current medicines are reviewed at length with the patient today.  Concerns regarding medicines are outlined above.  Orders Placed This Encounter  Procedures  . EKG 12-Lead  . VAS US CAROTID   Meds ordered this encounter  Medications  . amLODipine (NORVASC) 10 MG tablet    Sig: Take 1 tablet (10 mg total) by mouth daily.    Dispense:  90 tablet    Refill:  3    Patient Instructions  Medication Instructions:  No changes *If you need a refill on your cardiac medications before your next  appointment, please call your pharmacy*   Lab Work: None ordered If you have labs (blood work) drawn today and your tests are completely normal, you will receive your results only by: Marland Kitchen MyChart Message (if you have MyChart) OR . A paper copy in the mail If you have any lab test that is abnormal or we need to change your treatment, we will call you  to review the results.   Testing/Procedures: Your physician has requested that you have a carotid duplex. This test is an ultrasound of the carotid arteries in your neck. It looks at blood flow through these arteries that supply the brain with blood. Allow one hour for this exam. There are no restrictions or special instructions.   Follow-Up: At [facility], you and your health needs are our priority.  As part of our continuing mission to provide you with exceptional heart care, we have created designated Provider Care Teams.  These Care Teams include your primary Cardiologist (physician) and Advanced Practice Providers (APPs -  Physician Assistants and Nurse Practitioners) who all work together to provide you with the care you need, when you need it.  We recommend signing up for the patient portal called "MyChart".  Sign up information is provided on this After Visit Summary.  MyChart is used to connect with patients for Virtual Visits (Telemedicine).  Patients are able to view lab/test results, encounter notes, upcoming appointments, etc.  Non-urgent messages can be sent to your provider as well.   To learn more about what you can do with MyChart, go to [email or web address].    Your next appointment:   12 month(s)  The format for your next appointment:   In Person  Provider:   You may see Sanda Klein, MD or one of the following Advanced Practice Providers on your designated Care Team:    Almyra Deforest, PA-C  Fabian Sharp, [place] or   Roby Lofts, [place]        Signed, Dr. Jose Persia Internal Medicine PGY-2  08/20/2019,  12:49 PM  I have seen and examined the patient along with Dr. Jose Persia.  I have reviewed the chart, notes and new data.  I agree with her note.  Key new complaints: no CV symptoms Key examination changes: R carotid bruit is new, BP elevation is not typical Key new findings / data: excellent lipid parameters  PLAN: Check carotid Doppler. Continue same meds  Sanda Klein, MD, [place] [phone] 08/20/2019, 12:49 PM

## 2019-08-27 ENCOUNTER — Ambulatory Visit
Admission: RE | Admit: 2019-08-27 | Discharge: 2019-08-27 | Disposition: A | Discharge status: Home / Self Care | Payer: Medicare Other | Source: Ambulatory Visit | Attending: Family Medicine | Admitting: Family Medicine

## 2019-08-27 ENCOUNTER — Other Ambulatory Visit: Payer: Self-pay

## 2019-08-27 DIAGNOSIS — Z1231 Encounter for screening mammogram for malignant neoplasm of breast: Secondary | ICD-10-CM | POA: Diagnosis not present

## 2019-09-24 ENCOUNTER — Ambulatory Visit (HOSPITAL_COMMUNITY)
Admission: RE | Admit: 2019-09-24 | Discharge: 2019-09-24 | Disposition: A | Discharge status: Home / Self Care | Payer: Medicare Other | Source: Ambulatory Visit | Attending: Cardiovascular Disease | Admitting: Cardiovascular Disease

## 2019-09-24 ENCOUNTER — Other Ambulatory Visit: Payer: Self-pay

## 2019-09-24 DIAGNOSIS — R0989 Other specified symptoms and signs involving the circulatory and respiratory systems: Secondary | ICD-10-CM | POA: Diagnosis not present

## 2019-11-01 ENCOUNTER — Other Ambulatory Visit: Payer: Self-pay | Admitting: Cardiovascular Disease

## 2019-11-05 DIAGNOSIS — E782 Mixed hyperlipidemia: Secondary | ICD-10-CM | POA: Diagnosis not present

## 2019-11-05 DIAGNOSIS — E559 Vitamin D deficiency, unspecified: Secondary | ICD-10-CM | POA: Diagnosis not present

## 2019-11-05 DIAGNOSIS — Z23 Encounter for immunization: Secondary | ICD-10-CM | POA: Diagnosis not present

## 2019-11-05 DIAGNOSIS — I10 Essential (primary) hypertension: Secondary | ICD-10-CM | POA: Diagnosis not present

## 2019-11-06 LAB — CBC
Hematocrit: 40.5 % (ref 34.0–46.6)
Hemoglobin: 13.5 g/dL (ref 11.1–15.9)
MCH: 29.7 pg (ref 26.6–33.0)
MCHC: 33.3 g/dL (ref 31.5–35.7)
MCV: 89 fL (ref 79–97)
Platelets: 229 10*3/uL (ref 150–450)
RBC: 4.55 x10E6/uL (ref 3.77–5.28)
RDW: 13.7 % (ref 11.7–15.4)
WBC: 6.8 10*3/uL (ref 3.4–10.8)

## 2019-11-06 LAB — COMPREHENSIVE METABOLIC PANEL
ALT: 16 IU/L (ref 0–32)
AST: 18 IU/L (ref 0–40)
Albumin/Globulin Ratio: 1.9 (ref 1.2–2.2)
Albumin: 4.6 g/dL (ref 3.7–4.7)
Alkaline Phosphatase: 130 IU/L — ABNORMAL HIGH (ref 44–121)
BUN/Creatinine Ratio: 14 (ref 12–28)
BUN: 10 mg/dL (ref 8–27)
Bilirubin Total: 0.3 mg/dL (ref 0.0–1.2)
CO2: 25 mmol/L (ref 20–29)
Calcium: 10.5 mg/dL — ABNORMAL HIGH (ref 8.7–10.3)
Chloride: 100 mmol/L (ref 96–106)
Creatinine, Ser: 0.7 mg/dL (ref 0.57–1.00)
GFR calc Af Amer: 101 mL/min/{1.73_m2} (ref >59)
GFR calc non Af Amer: 87 mL/min/{1.73_m2} (ref >59)
Globulin, Total: 2.4 g/dL (ref 1.5–4.5)
Glucose: 94 mg/dL (ref 65–99)
Potassium: 4.3 mmol/L (ref 3.5–5.2)
Sodium: 143 mmol/L (ref 134–144)
Total Protein: 7 g/dL (ref 6.0–8.5)

## 2019-11-06 LAB — LIPID PANEL
Chol/HDL Ratio: 5.8 ratio — ABNORMAL HIGH (ref 0.0–4.4)
Cholesterol, Total: 345 mg/dL — ABNORMAL HIGH (ref 100–199)
HDL: 59 mg/dL (ref >39)
LDL Chol Calc (NIH): 261 mg/dL — ABNORMAL HIGH (ref 0–99)
Triglycerides: 136 mg/dL (ref 0–149)
VLDL Cholesterol Cal: 25 mg/dL (ref 5–40)

## 2019-11-06 LAB — TSH: TSH: 1.45 u[IU]/mL (ref 0.450–4.500)

## 2019-11-06 LAB — VITAMIN D 25 HYDROXY (VIT D DEFICIENCY, FRACTURES): Vit D, 25-Hydroxy: 46.7 ng/mL (ref 30.0–100.0)

## 2019-11-10 ENCOUNTER — Encounter: Payer: Self-pay | Admitting: Family Medicine

## 2019-11-10 ENCOUNTER — Other Ambulatory Visit: Payer: Self-pay

## 2019-11-10 ENCOUNTER — Ambulatory Visit (INDEPENDENT_AMBULATORY_CARE_PROVIDER_SITE_OTHER): Payer: Medicare Other | Admitting: Family Medicine

## 2019-11-10 VITALS — BP 128/80 | HR 90 | Resp 16 | Ht 61.0 in | Wt 138.0 lb

## 2019-11-10 DIAGNOSIS — E782 Mixed hyperlipidemia: Secondary | ICD-10-CM | POA: Diagnosis not present

## 2019-11-10 DIAGNOSIS — I10 Essential (primary) hypertension: Secondary | ICD-10-CM

## 2019-11-10 DIAGNOSIS — M8580 Other specified disorders of bone density and structure, unspecified site: Secondary | ICD-10-CM

## 2019-11-10 DIAGNOSIS — Z0001 Encounter for general adult medical examination with abnormal findings: Secondary | ICD-10-CM

## 2019-11-10 MED ORDER — ATORVASTATIN CALCIUM 40 MG PO TABS: 40.0000 mg | ORAL_TABLET | Freq: Every day | ORAL | 2 refills | Status: DC

## 2019-11-10 NOTE — Progress Notes (Signed)
Health Maintenance reviewed -   Immunization History  Administered Date(s) Administered  . Influenza, High Dose Seasonal PF 12/09/2017  . Influenza-Unspecified 11/05/2019  . Moderna SARS-COVID-2 Vaccination 03/13/2019, 04/13/2019  . Tdap 09/10/2012   Last Pap smear: n/a Last mammogram: 08/2019 Last colonoscopy: 12/2014 Last DEXA: 08/2018 osteopenic  Dentist: yearly- cap on one tooth in front Ophtho: has not had checked in a while, but it working to get an appt at [facility]  Exercise: active but no structured  Smoker: no  Alcohol Use: no  Other doctors caring for patient include:  Patient Care Team: Perlie Mayo, NP as PCP - General (Family Medicine) Croitoru, Dani Gobble, MD as PCP - Cardiology (Cardiology)  End of Life Discussion:  Patient has a living will and medical power of attorney  Subjective:   HPI  Alyssa Navarro is a 69 y.o. female who presents for annual comprehensive visit and follow-up on chronic medical conditions.  She has the following concerns: Only concern is that her cholesterol levels are up again.  She reports has not been eating the best that she could but overall she did not think that they would jump that much.  She is willing to have it adjustment of her medication.  She reports that she does not want to take the higher dose of Repatha.  But he is willing to go back on the atorvastatin which she did have problems with she did have problems with the Crestor.  She denies having any changes in her sleep habits.  Denies having any trouble with her chewing or swallowing.  Denies having any trouble with bowel or bladder habits.  No blood in urine or stool.  Denies having any memory changes.  Denies having any recent falls or injuries.  Denies having any skin issues.  Denies having any hearing or vision changes.  She denies having chest pain, palpitations, leg swelling, cough, shortness of breath, headaches, dizziness or vision changes.  Review Of  Systems  Review of Systems  Constitutional: Negative.   HENT: Negative.   Eyes: Negative.   Respiratory: Negative.   Cardiovascular: Negative.   Gastrointestinal: Negative.   Endocrine: Negative.   Genitourinary: Negative.   Musculoskeletal: Negative.   Skin: Negative.   Neurological: Negative.   Psychiatric/Behavioral: Positive for sleep disturbance.    Objective:   PHYSICAL EXAM:  BP 128/80   Pulse 90   Resp 16   Ht 5\' 1"  (1.549 m)   Wt 138 lb 0.6 oz (62.6 kg)   SpO2 96%   BMI 26.08 kg/m   Physical Exam Vitals and nursing note reviewed.  Constitutional:      Appearance: Normal appearance. She is well-developed, well-groomed and overweight.  HENT:     Head: Normocephalic.     Right Ear: Hearing and external ear normal.     Left Ear: Hearing and external ear normal.     Nose: Nose normal.     Mouth/Throat:     Lips: Pink.     Mouth: Mucous membranes are moist.     Pharynx: Oropharynx is clear. Uvula midline.  Eyes:     General: Lids are normal.     Extraocular Movements: Extraocular movements intact.     Conjunctiva/sclera: Conjunctivae normal.     Pupils: Pupils are equal, round, and reactive to light.  Neck:     Thyroid: No thyroid mass, thyromegaly or thyroid tenderness.     Vascular: No carotid bruit.  Cardiovascular:     Rate and  Rhythm: Normal rate and regular rhythm.     Pulses: Normal pulses.          Radial pulses are 2+ on the right side and 2+ on the left side.       Dorsalis pedis pulses are 2+ on the right side and 2+ on the left side.       Posterior tibial pulses are 2+ on the right side and 2+ on the left side.     Heart sounds: Normal heart sounds.  Pulmonary:     Effort: Pulmonary effort is normal.     Breath sounds: Normal breath sounds.  Abdominal:     General: Abdomen is flat. Bowel sounds are normal.     Palpations: Abdomen is soft.     Tenderness: There is generalized abdominal tenderness. There is no right CVA tenderness or left  CVA tenderness.     Hernia: No hernia is present.  Musculoskeletal:        General: Normal range of motion.     Cervical back: Full passive range of motion without pain, normal range of motion and neck supple.     Right lower leg: No edema.     Left lower leg: No edema.     Comments: Moves all extremities, range of motion intact  Lymphadenopathy:     Cervical: No cervical adenopathy.  Skin:    General: Skin is warm and dry.     Capillary Refill: Capillary refill takes less than 2 seconds.  Neurological:     General: No focal deficit present.     Mental Status: She is alert and oriented to person, place, and time. Mental status is at baseline.     Cranial Nerves: Cranial nerves are intact.     Sensory: Sensation is intact.     Motor: Motor function is intact.     Coordination: Coordination is intact.     Gait: Gait is intact.     Deep Tendon Reflexes: Reflexes are normal and symmetric.  Psychiatric:        Attention and Perception: Attention and perception normal.        Mood and Affect: Mood and affect normal.        Speech: Speech normal.        Behavior: Behavior normal. Behavior is cooperative.        Thought Content: Thought content normal.        Cognition and Memory: Cognition and memory normal.        Judgment: Judgment normal.     Comments: Good communication, good eye contact     Depression Screening  Depression screen [facility] 2/9 11/10/2019 08/10/2019 06/02/2019  Decreased Interest 0 0 0  Down, Depressed, Hopeless 0 0 0  PHQ - 2 Score 0 0 0     Falls  Fall Risk  11/10/2019 08/10/2019 06/02/2019 12/28/2018 10/04/2015  Falls in the past year? 0 0 0 0 Yes  Comment - - - [facility] Telephone Survey: data to providers prior to load [employer] Telephone Survey: data to providers prior to load  Number falls in past yr: - 0 0 - 1  Comment - - - - [facility] Telephone Survey Actual Response = 1  Injury with Fall? - 0 0 - No  Risk for fall due to : - No Fall Risks - - -  Follow up - Falls  evaluation completed Falls evaluation completed - -    Assessment & Plan:   1. Annual visit for general adult  medical examination with abnormal findings   2. Mixed hyperlipidemia   3. Osteopenia, unspecified location   4. Primary hypertension     Tests ordered Orders Placed This Encounter  Procedures  . Lipid panel  . Comprehensive metabolic panel     Plan: Please see assessment and plan per problem list above.   Meds ordered this encounter  Medications  . atorvastatin (LIPITOR) 40 MG tablet    Sig: Take 1 tablet (40 mg total) by mouth daily.    Dispense:  30 tablet    Refill:  2    Order Specific Question:   Supervising Provider    Answer:   Fayrene Helper [[id number]]    Medicare Attestation I have personally reviewed: The patient's medical and social history Their use of alcohol, tobacco or illicit drugs Their current medications and supplements The patient's functional ability including ADLs,fall risks, home safety risks, cognitive, and hearing and visual impairment Diet and physical activities Evidence for depression or mood disorders  The patient's weight, height, BMI, and visual acuity have been recorded in the chart.  I have made referrals, counseling, and provided education to the patient based on review of the above and I have provided the patient with a written personalized care plan for preventive services.    Note: This dictation was prepared with Dragon dictation along with smaller phrase technology. Similar sounding words can be transcribed inadequately or may not be corrected upon review. Any transcriptional errors that result from this process are unintentional.      Perlie Mayo, NP   11/10/2019

## 2019-11-10 NOTE — Patient Instructions (Signed)
°  HAPPY FALL!  I appreciate the opportunity to provide you with care for your health and wellness. Today we discussed: overall health  Follow up: 4 months   Labs-fasting at [place] 3 days prior to next appt. No referrals today  Medication changes as we discussed:  Finish out Repatha and start Lipitor daily after that. Call if you have any trouble. Continue to work on diet changes.  Please continue to practice social distancing to keep you, your family, and our community safe.  If you must go out, please wear a mask and practice good handwashing.  It was a pleasure to see you and I look forward to continuing to work together on your health and well-being. Please do not hesitate to call the office if you need care or have questions about your care.  Have a wonderful day and week. With Gratitude, Cherly Beach, DNP, AGNP-BC  HEALTH MAINTENANCE RECOMMENDATIONS:  It is recommended that you get at least 30 minutes of aerobic exercise at least 5 days/week (for weight loss, you may need as much as 60-90 minutes). This can be any activity that gets your heart rate up. This can be divided in 10-15 minute intervals if needed, but try and build up your endurance at least once a week.  Weight bearing exercise is also recommended twice weekly.  Eat a healthy diet with lots of vegetables, fruits and fiber.  "Colorful" foods have a lot of vitamins (ie green vegetables, tomatoes, red peppers, etc).  Limit sweet tea, regular sodas and alcoholic beverages, all of which has a lot of calories and sugar.  Up to 1 alcoholic drink daily may be beneficial for women (unless trying to lose weight, watch sugars).  Drink a lot of water.  Calcium recommendations are 1200-1500 mg daily (1500 mg for postmenopausal women or women without ovaries), and vitamin D 1000 IU daily.  This should be obtained from diet and/or supplements (vitamins), and calcium should not be taken all at once, but in divided doses of 500  mg.  Monthly self breast exams and yearly mammograms for women over the age of 33 is recommended.  Sunscreen of at least SPF 30 should be used on all sun-exposed parts of the skin when outside between the hours of 10 am and 4 pm (not just when at beach or pool, but even with exercise, golf, tennis, and yard work!)  Use a sunscreen that says "broad spectrum" so it covers both UVA and UVB rays, and make sure to reapply every 1-2 hours.  Remember to change the batteries in your smoke detectors when changing your clock times in the spring and fall.  Use your seat belt every time you are in a car, and please drive safely and not be distracted with cell phones and texting while driving.

## 2019-11-10 NOTE — Assessment & Plan Note (Signed)
She reports not eating well recently. She is on repatha and does not want to increase to 420 mg monthly. She reports she tolerated Lipitor and is willing to go back on the statin and come off Repatha and change diet to see if she can keep it uncontrolled that way. She will complete the next month of Repatha she has at home and then transition to Lipitor. Follow up in 3-4 months for lab checks.  Patient acknowledged agreement and understanding of the plan.

## 2019-11-10 NOTE — Assessment & Plan Note (Signed)
Was not taking calcium 3 times daily. Advised to start taking 500 mg daily and vitamin D3 1-2,000 daily Including weight baring exercises is also encouraged and suggested. Patient acknowledged agreement and understanding of the plan.

## 2019-11-10 NOTE — Assessment & Plan Note (Signed)
Controlled, continue current medication regimen.  No refills needed. DASH diet and exercise 60 mins daily like walking is suggested and encouraged.

## 2019-11-10 NOTE — Assessment & Plan Note (Signed)
Discussed monthly self breast exams and yearly mammograms; at least 30 minutes of aerobic activity at least 5 days/week and weight-bearing exercise 2x/week; proper sunscreen use reviewed; healthy diet, including goals of calcium and vitamin D intake and alcohol recommendations (less than or equal to 1 drink/day) reviewed; regular seatbelt use; changing batteries in smoke detectors.  Immunization recommendations discussed.  Colonoscopy recommendations reviewed.  

## 2019-12-01 ENCOUNTER — Encounter (INDEPENDENT_AMBULATORY_CARE_PROVIDER_SITE_OTHER): Payer: Self-pay | Admitting: *Deleted

## 2019-12-21 DIAGNOSIS — Z23 Encounter for immunization: Secondary | ICD-10-CM | POA: Diagnosis not present

## 2020-02-07 ENCOUNTER — Emergency Department (HOSPITAL_COMMUNITY): Payer: Medicare Other

## 2020-02-07 ENCOUNTER — Emergency Department (HOSPITAL_COMMUNITY)
Admission: EM | Admit: 2020-02-07 | Discharge: 2020-02-07 | Disposition: A | Discharge status: Home / Self Care | Payer: Medicare Other | Attending: Emergency Medicine | Admitting: Emergency Medicine

## 2020-02-07 ENCOUNTER — Encounter (HOSPITAL_COMMUNITY): Payer: Self-pay | Admitting: Emergency Medicine

## 2020-02-07 ENCOUNTER — Other Ambulatory Visit: Payer: Self-pay

## 2020-02-07 DIAGNOSIS — S8252XA Displaced fracture of medial malleolus of left tibia, initial encounter for closed fracture: Secondary | ICD-10-CM | POA: Diagnosis not present

## 2020-02-07 DIAGNOSIS — S82842A Displaced bimalleolar fracture of left lower leg, initial encounter for closed fracture: Secondary | ICD-10-CM | POA: Insufficient documentation

## 2020-02-07 DIAGNOSIS — W19XXXA Unspecified fall, initial encounter: Secondary | ICD-10-CM

## 2020-02-07 DIAGNOSIS — I2510 Atherosclerotic heart disease of native coronary artery without angina pectoris: Secondary | ICD-10-CM | POA: Insufficient documentation

## 2020-02-07 DIAGNOSIS — S82892A Other fracture of left lower leg, initial encounter for closed fracture: Secondary | ICD-10-CM

## 2020-02-07 DIAGNOSIS — Z79899 Other long term (current) drug therapy: Secondary | ICD-10-CM | POA: Diagnosis not present

## 2020-02-07 DIAGNOSIS — S8992XA Unspecified injury of left lower leg, initial encounter: Secondary | ICD-10-CM | POA: Diagnosis present

## 2020-02-07 DIAGNOSIS — I10 Essential (primary) hypertension: Secondary | ICD-10-CM | POA: Insufficient documentation

## 2020-02-07 DIAGNOSIS — Y9301 Activity, walking, marching and hiking: Secondary | ICD-10-CM | POA: Insufficient documentation

## 2020-02-07 DIAGNOSIS — W1841XA Slipping, tripping and stumbling without falling due to stepping on object, initial encounter: Secondary | ICD-10-CM | POA: Diagnosis not present

## 2020-02-07 DIAGNOSIS — Z91040 Latex allergy status: Secondary | ICD-10-CM | POA: Diagnosis not present

## 2020-02-07 DIAGNOSIS — S82832A Other fracture of upper and lower end of left fibula, initial encounter for closed fracture: Secondary | ICD-10-CM | POA: Diagnosis not present

## 2020-02-07 DIAGNOSIS — R52 Pain, unspecified: Secondary | ICD-10-CM | POA: Diagnosis not present

## 2020-02-07 DIAGNOSIS — M7989 Other specified soft tissue disorders: Secondary | ICD-10-CM | POA: Diagnosis not present

## 2020-02-07 MED ORDER — FENTANYL CITRATE (PF) 100 MCG/2ML IJ SOLN
50.0000 ug | Freq: Once | INTRAMUSCULAR | Status: AC
  Administered 2020-02-07: 50 ug via INTRAVENOUS
  Filled 2020-02-07: qty 2

## 2020-02-07 MED ORDER — HYDROCODONE-ACETAMINOPHEN 5-325 MG PO TABS: 1.0000 | ORAL_TABLET | Freq: Four times a day (QID) | ORAL | 0 refills | Status: DC | PRN

## 2020-02-07 MED ORDER — ACETAMINOPHEN 325 MG PO TABS
650.0000 mg | ORAL_TABLET | Freq: Once | ORAL | Status: AC
  Administered 2020-02-07: 650 mg via ORAL
  Filled 2020-02-07: qty 2

## 2020-02-07 NOTE — Discharge Instructions (Addendum)
At this time there does not appear to be the presence of an emergent medical condition, however there is always the potential for conditions to change. Please read and follow the below instructions.  Please return to the Emergency Department immediately for any new or worsening symptoms. Please be sure to follow up with your Primary Care Provider within one week regarding your visit today; please call their office to schedule an appointment even if you are feeling better for a follow-up visit. Call the orthopedic specialist Dr. Dallas Schimke on your discharge paperwork today to schedule follow-up appointment for definitive treatment of your left ankle fracture.  Use rest ice and elevation to help with pain and swelling.  Please continue to use the crutches given to you today when moving about.  Do not place any weight on your left leg. You may take the medication Norco (Hydrocodone/Acetaminophen) as prescribed to help with severe pain.  This medication will make you drowsy so do not drive, drink alcohol, take other sedating medications or perform any dangerous activities such as driving after taking Norco. Norco contains Tylenol (acetaminophen) so do not take any other Tylenol-containing products with Norco.  Go to the nearest Emergency Department immediately if: You have fever or chills You have very bad pain in your injured leg. You have swelling or redness in your foot that is getting worse. You begin to lose feeling in your foot or your toes. Your foot or your toes are cold or turn blue. You have any new/concerning or worsening of symptoms  Please read the additional information packets attached to your discharge summary.  Do not take your medicine if  develop an itchy rash, swelling in your mouth or lips, or difficulty breathing; call 911 and seek immediate emergency medical attention if this occurs.  You may review your lab tests and imaging results in their entirety on your MyChart account.   Please discuss all results of fully with your primary care provider and other specialist at your follow-up visit.  Note: Portions of this text may have been transcribed using voice recognition software. Every effort was made to ensure accuracy; however, inadvertent computerized transcription errors may still be present.

## 2020-02-07 NOTE — ED Provider Notes (Signed)
[facility] Provider Note   CSN: [id number] Arrival date & time: 02/07/20  1225     History Chief Complaint  Patient presents with  . [place] is a 70 y.o. female history includes hypertension, hyperlipidemia, CAD.  Patient reports that less than an hour prior to arrival she was walking outside to grab a sled for her grandson, she slipped on some ice and heard a pop to the lateral portion of her left ankle she reports immediate throbbing pain constant nonradiating worse with movement improved with rest associated with some swelling.  Denies head injury, headache, blood thinner use, loss of consciousness, neck pain, back pain, chest pain, abdominal pain, nausea/vomiting, pelvic pain, upper extremity pain, right lower extremity pain, numbness/tingling, weakness or any additional concerns.  HPI     Past Medical History:  Diagnosis Date  . Allergy   . Anemia   . Coronary artery disease   . Hyperlipidemia   . Hypertension   . Screening for malignant neoplasm of the cervix 06/23/2013  . Trichimoniasis 07/19/2013    Patient Active Problem List   Diagnosis Date Noted  . Annual visit for general adult medical examination with abnormal findings 11/10/2019  . Osteopenia 06/02/2019  . CAD (coronary artery disease)  10/26/2012  . Mixed hyperlipidemia 10/26/2012  . HTN (hypertension) 10/26/2012    Past Surgical History:  Procedure Laterality Date  . 2D Echocardiogram  12/22/2007   EF >55%, normal.  . ABDOMINAL HYSTERECTOMY    . APPENDECTOMY    . ARTHROSCOPY WITH ANTERIOR CRUCIATE LIGAMENT (ACL) REPAIR WITH ANTERIOR TIBILIAS GRAFT Right 2007  . CARDIAC CATHETERIZATION  02/12/1991   CAD demonstrated in Proximal LAD-50% stenosis, Distal LAD-60%, Mid Circumflex-60% stenosis, Marginal Circumflex-80% stenosis, Proximal RCA-70% stenosis  . CARDIAC CATHETERIZATION  11/12/1993   Mild-moderate 3-vessel disease-50% mid LAD, 75% mid circumflex, 80% ostial  portion L circumflex.  Marland Kitchen CARDIAC CATHETERIZATION  12/03/1996   95% mid circumflex stenosis stented with a 3.5x65mm Sci-Med NIR, resulting in reduciton to 0%. 75% distal circumflex stenosis stented with a 3.0x34mm NIR stent resulting in reduciton to 0%.  . CARDIAC CATHETERIZATION  08/23/2003   Continued medical therapy. Patent L circumflex stents placed in 1998.  . CHOLECYSTECTOMY    . COLONOSCOPY N/A 12/21/2014   Procedure: COLONOSCOPY;  Surgeon: Rogene Houston, MD;  [facility]: [facility];  Service: Endoscopy;  Laterality: N/A;  830  . Lexiscan Myoview  12/22/2007   No ECG changes, nondiagnostic EKG. Perfusion defect in inferior myocardial region consistent with diaphragmatic attenuation.  . TUBAL LIGATION       OB History   No obstetric history on file.     Family History  Problem Relation Age of Onset  . Diabetes Mother   . Hypertension Mother   . Hyperlipidemia Mother   . Heart failure Other   . Hypertension Other   . Diabetes Other   . Hyperlipidemia Father   . Heart disease Sister   . Cancer Maternal Aunt        breast  . Breast cancer Maternal Aunt   . Cancer Maternal Grandmother        breast  . Diabetes Maternal Grandmother   . Hypertension Paternal Grandmother   . Heart failure Paternal Grandmother   . Breast cancer Paternal Grandmother   . Fibroids Sister     Social History   Tobacco Use  . Smoking status: Never Smoker  . Smokeless tobacco: Never Used  Substance Use  Topics  . Alcohol use: No  . Drug use: No    Home Medications Prior to Admission medications   Medication Sig Start Date End Date Taking? Authorizing Provider  acetaminophen (TYLENOL) 325 MG tablet Take 650 mg by mouth every 6 (six) hours as needed for moderate pain.   Yes [provider]  amLODipine (NORVASC) 10 MG tablet Take 1 tablet (10 mg total) by mouth daily. 08/19/19  Yes Croitoru, Mihai, MD  atorvastatin (LIPITOR) 40 MG tablet Take 1 tablet (40 mg total) by mouth daily.  11/10/19  Yes Freddy Finner, NP  Calcium Carb-Cholecalciferol (CALTRATE 600+D3 PO) Take 1 tablet by mouth daily.   Yes [provider]  carvedilol (COREG) 6.25 MG tablet TAKE 1 TABLET(6.25 MG) BY MOUTH TWICE DAILY Patient taking differently: Take 6.25 mg by mouth 2 (two) times daily with a meal. 11/01/19  Yes Croitoru, Mihai, MD  cholecalciferol (VITAMIN D) 1000 UNITS tablet Take 1,000 Units by mouth daily.    Yes [provider]  Coenzyme Q10 (CO Q10) 100 MG CAPS Take 200 mg by mouth daily.    Yes [provider]  HYDROcodone-acetaminophen (NORCO/VICODIN) 5-325 MG tablet Take 1 tablet by mouth every 6 (six) hours as needed. 02/07/20  Yes Harlene Salts A, PA-C  ibuprofen (ADVIL,MOTRIN) 200 MG tablet Take 400 mg by mouth every 6 (six) hours as needed for moderate pain.   Yes [provider]  Multiple Vitamins-Minerals (CENTRUM SILVER PO) Take 1 tablet by mouth daily.   Yes [provider]  REPATHA SURECLICK 140 MG/ML SOAJ Inject 1 mL into the skin every 14 (fourteen) days. 01/27/20  Yes [provider]  clobetasol ointment (TEMOVATE) 0.05 % Apply 1 application topically daily as needed for rash. 08/29/15   [provider]    Allergies    Avelox [moxifloxacin hcl in nacl], Quinolones, Statins, Latex, Metoprolol, and Sulfa antibiotics  Review of Systems   Review of Systems  Constitutional: Negative.  Negative for chills and fever.  Eyes: Negative.  Negative for visual disturbance.  Cardiovascular: Negative.  Negative for chest pain.  Gastrointestinal: Negative.  Negative for abdominal pain, nausea and vomiting.  Musculoskeletal: Positive for arthralgias (Left ankle) and joint swelling. Negative for back pain and neck pain.  Neurological: Negative.  Negative for weakness, numbness and headaches.    Physical Exam Updated Vital Signs BP 132/75   Pulse 85   Temp 98.1 F (36.7 C)   Resp 16   Ht 5\' 1"  (1.549 m)   Wt 59.9 kg    SpO2 99%   BMI 24.94 kg/m   Physical Exam Constitutional:      General: She is not in acute distress.    Appearance: Normal appearance. She is well-developed. She is not ill-appearing or diaphoretic.  HENT:     Head: Normocephalic and atraumatic. No abrasion or contusion.     Jaw: There is normal jaw occlusion.     Right Ear: No hemotympanum.     Left Ear: No hemotympanum.     Nose: Nose normal.     Mouth/Throat:     Mouth: Mucous membranes are moist.     Pharynx: Oropharynx is clear.     Comments: No evidence of injury Eyes:     General: Vision grossly intact. Gaze aligned appropriately.     Extraocular Movements: Extraocular movements intact.     Conjunctiva/sclera: Conjunctivae normal.     Pupils: Pupils are equal, round, and reactive to light.  Neck:  Trachea: Trachea and phonation normal. No tracheal tenderness or tracheal deviation.  Cardiovascular:     Rate and Rhythm: Normal rate and regular rhythm.     Pulses:          Dorsalis pedis pulses are 2+ on the right side and 2+ on the left side.  Pulmonary:     Effort: Pulmonary effort is normal. No respiratory distress.     Breath sounds: Normal breath sounds and air entry.  Chest:     Chest wall: No deformity or tenderness.  Abdominal:     General: There is no distension.     Palpations: Abdomen is soft.     Tenderness: There is no abdominal tenderness. There is no guarding or rebound.  Musculoskeletal:        General: Normal range of motion.     Cervical back: Normal range of motion and neck supple. No spinous process tenderness or muscular tenderness.     Comments: No midline C/T/L spinal tenderness to palpation, no paraspinal muscle tenderness, no deformity, crepitus, or step-off noted. No sign of injury to the neck or back.  Pelvis stable to compression bilateral without pain.  Patient able bring bilateral knees to chest without pain.  Full range of motion appropriate strength of all major joints of the upper  extremities without pain.  Full range of motion and appropriate strength in all major joints of the right lower extremity without pain.  Full range of motion at the left knee without pain crepitus or deformity.  No pain with palpation of the left lower leg. - Left ankle: Moderate diffuse swelling of the ankle with tenderness to palpation no skin break or tenting.  Achilles tendon is intact.  No medial malleolus tenderness.  Flexion extension inversion and eversion limited secondary to pain.  Strong equal pedal pulses, capillary fill and sensation intact to all toes.  No pain with palpation of the feet.  Feet:     Right foot:     Protective Sensation: 5 sites tested. 5 sites sensed.     Left foot:     Protective Sensation: 5 sites tested. 5 sites sensed.  Skin:    General: Skin is warm and dry.  Neurological:     Mental Status: She is alert.     GCS: GCS eye subscore is 4. GCS verbal subscore is 5. GCS motor subscore is 6.     Comments: Speech is clear and goal oriented, follows commands Major Cranial nerves without deficit, no facial droop Moves extremities without ataxia, coordination intact  Psychiatric:        Behavior: Behavior normal.    ED Results / Procedures / Treatments   Labs (all labs ordered are listed, but only abnormal results are displayed) Labs Reviewed - No data to display  EKG None  Radiology DG Ankle 2 Views Left  Result Date: 02/07/2020 CLINICAL DATA:  Post reduction radiographs EXAM: LEFT ANKLE - 2 VIEW COMPARISON:  X-ray earlier in the same day FINDINGS: The patient has undergone placement of a fiberglass splint. Again noted are fractures of the distal fibula and tibia. The osseous alignment is somewhat improved, however the posterior malleolar and medial malleolar fractures appear slightly more displaced. IMPRESSION: Status post placement of a fiberglass splint as detailed above. Electronically Signed   By: Constance Holster M.D.   On: 02/07/2020 14:58   DG  Ankle Complete Left  Result Date: 02/07/2020 CLINICAL DATA:  Pt states she slipped and fell this morning at home/swelling  and most of her pain is in ankle EXAM: LEFT ANKLE COMPLETE - 3+ VIEW COMPARISON:  None. FINDINGS: Displaced bimalleolar ankle fractures. There is a fracture across the base of the medial malleolus with the distal fracture component displacing inferiorly and 5-6 mm medially. There is an oblique fracture of the distal fibula with the distal component displacing laterally also by 5-6 mm. The ankle has subluxed laterally by 5-6 mm and is tilted laterally, but not dislocated. There is surrounding soft tissue swelling. IMPRESSION: 1. Bimalleolar left ankle fracture, with the distal fracture components displaced laterally by 5-6 mm along with lateral subluxation of the talus. No dislocation. Electronically Signed   By: Lajean Manes M.D.   On: 02/07/2020 13:22   DG Foot Complete Left  Result Date: 02/07/2020 CLINICAL DATA:  Pt states she slipped and fell this morning at home/swelling and most of her pain is in ankle EXAM: LEFT FOOT - COMPLETE 3+ VIEW COMPARISON:  None. FINDINGS: There are partly defined ankle fractures, defined under the ankle radiographs. No foot fracture or bone lesion. Joints are normally aligned. There are mild degenerative changes involving first metatarsophalangeal joint and multiple interphalangeal joints. Small plantar and moderate dorsal calcaneal spurs. There is ankle soft tissue swelling. IMPRESSION: 1. No foot fracture or dislocation. 2. Ankle fractures described under the ankle radiographs. Electronically Signed   By: Lajean Manes M.D.   On: 02/07/2020 13:20    Procedures .Ortho Injury Treatment  Date/Time: 02/07/2020 3:51 PM Performed by: Deliah Boston, PA-C Authorized by: Deliah Boston, PA-C   Consent:    Consent obtained:  Verbal   Consent given by:  Patient   Risks discussed:  Fracture, irreducible dislocation, recurrent dislocation, nerve  damage, vascular damage, stiffness and restricted joint movementInjury location: ankle Location details: left ankle Injury type: fracture-dislocation Fracture type: bimalleolar Pre-procedure neurovascular assessment: neurovascularly intact Pre-procedure distal perfusion: normal Pre-procedure neurological function: normal Pre-procedure range of motion: reduced  Anesthesia: Local anesthesia used: no  Patient sedated: NoManipulation performed: yes Skeletal traction used: yes Reduction successful: yes X-ray confirmed reduction: yes (Improved osseous alignment, but medial and posterior fractures more displaced per radiologist.) Immobilization: splint Splint type: short leg and ankle stirrup Supplies used: cotton padding,  elastic bandage and Ortho-Glass Post-procedure neurovascular assessment: post-procedure neurovascularly intact Post-procedure distal perfusion: normal Post-procedure neurological function: normal Post-procedure range of motion comment: Immobilized Patient tolerance: patient tolerated the procedure well with no immediate complications Comments: IV fentanyl provided for pain control.  Patient tolerated procedure well.  I held traction well nursing staff applied padding and Ortho-Glass/Ace Wrap.    (including critical care time)  Medications Ordered in ED Medications  acetaminophen (TYLENOL) tablet 650 mg (650 mg Oral Given 02/07/20 1330)  fentaNYL (SUBLIMAZE) injection 50 mcg (50 mcg Intravenous Given 02/07/20 1435)    ED Course  I have reviewed the triage vital signs and the nursing notes.  Pertinent labs & imaging results that were available during my care of the patient were reviewed by me and considered in my medical decision making (see chart for details).  Clinical Course as of 02/07/20 1555  Mon Feb 07, 2020  1332 Dr. Amedeo Kinsman [BM]  1332 Clinic this week, likely surgery. [BM]  Malin Dr. Amedeo Kinsman [BM]    Clinical Course User Index [BM] Gari Crown    MDM Rules/Calculators/A&P                         Additional history obtained from:  1. Nursing notes from this visit. 2. Review of electronic medical records. ----------------- 70 year old female presented after mechanical fall today on ice.  She presents with left ankle pain and slight deformity no skin tenting or skin break.  Neurovascularly intact.  Will obtain x-rays of the patient's left foot and ankle.  No evidence of injury of the head neck back chest abdomen upper extremities or right lower extremity.  She does not use any blood thinners and she has no other complaints.  Patient asks for anti-inflammatory pain medication on initial evaluation and refuses narcotics. - DG Left Ankle:    IMPRESSION:  1. Bimalleolar left ankle fracture, with the distal fracture  components displaced laterally by 5-6 mm along with lateral  subluxation of the talus. No dislocation.   DG Left Foot:    IMPRESSION:  1. Bimalleolar left ankle fracture, with the distal fracture  components displaced laterally by 5-6 mm along with lateral  subluxation of the talus. No dislocation.   1:32 PM: Consulted orthopedist Dr. Amedeo Kinsman, advises reduction in the ER splint application and postreduction films.  Patient to follow-up in his clinic this week for reevaluation and anticipates future surgical repair. - DG Left Foot:  FINDINGS:  The patient has undergone placement of a fiberglass splint. Again  noted are fractures of the distal fibula and tibia. The osseous  alignment is somewhat improved, however the posterior malleolar and  medial malleolar fractures appear slightly more displaced.    IMPRESSION:  Status post placement of a fiberglass splint as detailed above.   3:46 PM: Consulted Dr. Amedeo Kinsman who reviewed postreduction film, he advises patient is okay to be discharged and repeat reduction is not necessary.   Postreduction capillary refill and sensation intact.  Patient reports splint is comfortable.   PMDP reviewed, no previous narcotic prescriptions.  5 pills (five) Norco prescribed for pain associated with fracture.  RICE therapy discussed.  Patient advised to remain nonweightbearing, crutches provided.  Patient to call Dr. Amedeo Kinsman' office today to schedule follow-up appointment for definitive care.  Patient's daughter here to take her home.  Discussed narcotic precautions with patient.  At this time there does not appear to be any evidence of an acute emergency medical condition and the patient appears stable for discharge with appropriate outpatient follow up. Diagnosis was discussed with patient who verbalizes understanding of care plan and is agreeable to discharge. I have discussed return precautions with patient who verbalizes understanding. Patient encouraged to follow-up with their PCP and Ortho. All questions answered.  Patient was seen and evaluated by Dr. Roderic Palau during this visit who agrees with work-up, reduction/splint placement and discharge.  Note: Portions of this report may have been transcribed using voice recognition software. Every effort was made to ensure accuracy; however, inadvertent computerized transcription errors may still be present. Final Clinical Impression(s) / ED Diagnoses Final diagnoses:  Closed fracture of left ankle, initial encounter  Fall, initial encounter    Rx / DC Orders ED Discharge Orders         Ordered    HYDROcodone-acetaminophen (NORCO/VICODIN) 5-325 MG tablet  Every 6 hours PRN        02/07/20 1549           Gari Crown 02/07/20 1555    Milton Ferguson, MD 02/10/20 1016

## 2020-02-07 NOTE — ED Triage Notes (Addendum)
Pt c/o ankle pain after fall. Pt went outside to get sleds for grandkids and slipped on the ice. Pt heard a pop. Small "knot" noted to lateral side of ankle

## 2020-02-08 ENCOUNTER — Telehealth: Payer: Self-pay | Admitting: Orthopedic Surgery

## 2020-02-08 ENCOUNTER — Ambulatory Visit: Payer: Self-pay | Admitting: Orthopedic Surgery

## 2020-02-08 NOTE — Telephone Encounter (Signed)
-----   Message from Oliver Barre, MD sent at 02/07/2020  1:35 PM EST ----- Alyssa Navarro  This patient was seen in the ED today.  Please schedule her to see me this Friday to discuss surgery.  Please let her know that I will plan to schedule her for surgery next Thursday (1/13) and she may hear from the hospital prior to seeing me in clinic.  We can discuss everything related to her injury on Friday, but I will recommend surgery.   Thanks   Mark A. Dallas Schimke, MD MS [facility] [address] [place],  [place]  [address] Phone: [phone] Fax: [phone]

## 2020-02-08 NOTE — Telephone Encounter (Signed)
Patient has a follow up on 02/11/2020.

## 2020-02-10 ENCOUNTER — Telehealth: Payer: Self-pay | Admitting: Orthopedic Surgery

## 2020-02-10 NOTE — Telephone Encounter (Signed)
I called insurance to check and see if prior authorization was needed.   I spoke with Josh at [place] [employer] to check on benefits.   Pre-certification is not required at this time and there is not a cost of the patient out of pocket.    Reference number is: [id number].

## 2020-02-11 ENCOUNTER — Telehealth: Payer: Self-pay

## 2020-02-11 ENCOUNTER — Ambulatory Visit (INDEPENDENT_AMBULATORY_CARE_PROVIDER_SITE_OTHER): Payer: Medicare Other | Admitting: Orthopedic Surgery

## 2020-02-11 ENCOUNTER — Other Ambulatory Visit: Payer: Self-pay | Admitting: *Deleted

## 2020-02-11 ENCOUNTER — Encounter: Payer: Self-pay | Admitting: Orthopedic Surgery

## 2020-02-11 ENCOUNTER — Other Ambulatory Visit: Payer: Self-pay

## 2020-02-11 VITALS — BP 149/85 | HR 91 | Ht 61.0 in | Wt 132.0 lb

## 2020-02-11 DIAGNOSIS — W009XXA Unspecified fall due to ice and snow, initial encounter: Secondary | ICD-10-CM

## 2020-02-11 DIAGNOSIS — S82842A Displaced bimalleolar fracture of left lower leg, initial encounter for closed fracture: Secondary | ICD-10-CM

## 2020-02-11 DIAGNOSIS — E782 Mixed hyperlipidemia: Secondary | ICD-10-CM

## 2020-02-11 MED ORDER — ATORVASTATIN CALCIUM 40 MG PO TABS: 40.0000 mg | ORAL_TABLET | Freq: Every day | ORAL | 2 refills | Status: DC

## 2020-02-11 NOTE — Telephone Encounter (Signed)
Patient called requesting a handicap placard form be filled out for her p# [phone]

## 2020-02-11 NOTE — Progress Notes (Signed)
New Patient Visit  Assessment: Alyssa Navarro is a 70 y.o. female with the following: Left bimalleolar ankle fracture  Plan: Alyssa Navarro sustained a left ankle fracture which required closed reduction in the ED.  It is an unstable fracture and I have recommended ORIF.  Nonoperative treatment would result in an unstable ankle.  Risks and benefits of surgery, including, but not limited to infection, bleeding, persistent pain, damage to surrounding structures, need for further surgery, malunion, nonunion and more severe complications associated with anesthesia were discussed.  All questions have been answered and they have elected to proceed with surgery.    Surgical Plan  Procedure:  Operative fixation of left ankle fracture  Disposition: Ambulatory Anesthesia: General and a block Medical Comorbidities: CAD, HTN, HLD Notes: None   Follow-up: Return for After surgery.  Subjective:  Chief Complaint  Patient presents with  . Ankle Injury    L/ walked in snow and slipped.DOI 1/3/2. Its a little numb right now but the pain is mostly at night    History of Present Illness: Alyssa Navarro is a 70 y.o. female who presents for evaluation of her left ankle.  She slipped in the snow earlier this week and twisted her ankle.  She heard and felt a pop in her ankle. She presented to the ED and XR demonstrated a fracture requiring reduction.  She has remained NWB using crutches.  She has been taking Norco sparingly.  Pain has been controlled.  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    Review of Systems: No fevers or chills No chest pain No shortness of breath No bowel or bladder dysfunction No GI distress No headaches   Medical History:  Past Medical History:  Diagnosis Date  . Allergy   . Anemia   . Coronary artery disease   . Hyperlipidemia   . Hypertension   . Screening for malignant neoplasm of the cervix 06/23/2013  . Trichimoniasis  07/19/2013    Past Surgical History:  Procedure Laterality Date  . 2D Echocardiogram  12/22/2007   EF >55%, normal.  . ABDOMINAL HYSTERECTOMY    . APPENDECTOMY    . ARTHROSCOPY WITH ANTERIOR CRUCIATE LIGAMENT (ACL) REPAIR WITH ANTERIOR TIBILIAS GRAFT Right 2007  . CARDIAC CATHETERIZATION  02/12/1991   CAD demonstrated in Proximal LAD-50% stenosis, Distal LAD-60%, Mid Circumflex-60% stenosis, Marginal Circumflex-80% stenosis, Proximal RCA-70% stenosis  . CARDIAC CATHETERIZATION  11/12/1993   Mild-moderate 3-vessel disease-50% mid LAD, 75% mid circumflex, 80% ostial portion L circumflex.  Marland Kitchen CARDIAC CATHETERIZATION  12/03/1996   95% mid circumflex stenosis stented with a 3.5x33mm Sci-Med NIR, resulting in reduciton to 0%. 75% distal circumflex stenosis stented with a 3.0x28mm NIR stent resulting in reduciton to 0%.  . CARDIAC CATHETERIZATION  08/23/2003   Continued medical therapy. Patent L circumflex stents placed in 1998.  . CHOLECYSTECTOMY    . COLONOSCOPY N/A 12/21/2014   Procedure: COLONOSCOPY;  Surgeon: Rogene Houston, MD;  [facility]: [facility];  Service: Endoscopy;  Laterality: N/A;  830  . Lexiscan Myoview  12/22/2007   No ECG changes, nondiagnostic EKG. Perfusion defect in inferior myocardial region consistent with diaphragmatic attenuation.  . TUBAL LIGATION      Family History  Problem Relation Age of Onset  . Diabetes Mother   . Hypertension Mother   . Hyperlipidemia Mother   . Heart failure Other   . Hypertension Other   . Diabetes Other   . Hyperlipidemia  Father   . Heart disease Sister   . Cancer Maternal Aunt        breast  . Breast cancer Maternal Aunt   . Cancer Maternal Grandmother        breast  . Diabetes Maternal Grandmother   . Hypertension Paternal Grandmother   . Heart failure Paternal Grandmother   . Breast cancer Paternal Grandmother   . Fibroids Sister    Social History   Tobacco Use  . Smoking status: Never Smoker  . Smokeless tobacco: Never  Used  Substance Use Topics  . Alcohol use: No  . Drug use: No    Allergies  Allergen Reactions  . Avelox [Moxifloxacin Hcl In Nacl]   . Quinolones     Hallucinations, sleeplessness, rash  . Statins     Myalgias (high doses only)  . Latex Rash  . Metoprolol Rash  . Sulfa Antibiotics Rash    No outpatient medications have been marked as taking for the 02/11/20 encounter (Office Visit) with [name], MD.    Objective: BP (!) 149/85   Pulse 91   Ht 5\' 1"  (1.549 m)   Wt 132 lb (59.9 kg)   BMI 24.94 kg/m   Physical Exam:  General: Alert and oriented, no acute distress Gait: Unable due to NWB  Left ankle in a splint that is clean and dry.  Exposed toes are WWP.  Sensation intact distally.  Swelling to dorsum of foot.     IMAGING: I personally reviewed images previously obtained from the ED   XR left ankle with displaced lateral and medial malleoli fractures.    New Medications:  No orders of the defined types were placed in this encounter.     [name], MD  02/11/2020 11:37 PM

## 2020-02-11 NOTE — H&P (View-Only) (Signed)
New Patient Visit  Assessment: Alyssa Navarro is a 70 y.o. female with the following: Left bimalleolar ankle fracture  Plan: [name] sustained a left ankle fracture which required closed reduction in the ED.  It is an unstable fracture and I have recommended ORIF.  Nonoperative treatment would result in an unstable ankle.  Risks and benefits of surgery, including, but not limited to infection, bleeding, persistent pain, damage to surrounding structures, need for further surgery, malunion, nonunion and more severe complications associated with anesthesia were discussed.  All questions have been answered and they have elected to proceed with surgery.    Surgical Plan  Procedure:  Operative fixation of left ankle fracture  Disposition: Ambulatory Anesthesia: General and a block Medical Comorbidities: CAD, HTN, HLD Notes: None   Follow-up: Return for After surgery.  Subjective:  Chief Complaint  Patient presents with  . Ankle Injury    L/ walked in snow and slipped.DOI 1/3/2. Its a little numb right now but the pain is mostly at night    History of Present Illness: Alyssa Navarro is a 70 y.o. female who presents for evaluation of her left ankle.  She slipped in the snow earlier this week and twisted her ankle.  She heard and felt a pop in her ankle. She presented to the ED and XR demonstrated a fracture requiring reduction.  She has remained NWB using crutches.  She has been taking Norco sparingly.  Pain has been controlled.  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    Review of Systems: No fevers or chills No chest pain No shortness of breath No bowel or bladder dysfunction No GI distress No headaches   Medical History:  Past Medical History:  Diagnosis Date  . Allergy   . Anemia   . Coronary artery disease   . Hyperlipidemia   . Hypertension   . Screening for malignant neoplasm of the cervix 06/23/2013  . Trichimoniasis  07/19/2013    Past Surgical History:  Procedure Laterality Date  . 2D Echocardiogram  12/22/2007   EF >55%, normal.  . ABDOMINAL HYSTERECTOMY    . APPENDECTOMY    . ARTHROSCOPY WITH ANTERIOR CRUCIATE LIGAMENT (ACL) REPAIR WITH ANTERIOR TIBILIAS GRAFT Right 2007  . CARDIAC CATHETERIZATION  02/12/1991   CAD demonstrated in Proximal LAD-50% stenosis, Distal LAD-60%, Mid Circumflex-60% stenosis, Marginal Circumflex-80% stenosis, Proximal RCA-70% stenosis  . CARDIAC CATHETERIZATION  11/12/1993   Mild-moderate 3-vessel disease-50% mid LAD, 75% mid circumflex, 80% ostial portion L circumflex.  Marland Kitchen CARDIAC CATHETERIZATION  12/03/1996   95% mid circumflex stenosis stented with a 3.5x33mm Sci-Med NIR, resulting in reduciton to 0%. 75% distal circumflex stenosis stented with a 3.0x28mm NIR stent resulting in reduciton to 0%.  . CARDIAC CATHETERIZATION  08/23/2003   Continued medical therapy. Patent L circumflex stents placed in 1998.  . CHOLECYSTECTOMY    . COLONOSCOPY N/A 12/21/2014   Procedure: COLONOSCOPY;  Surgeon: Rogene Houston, MD;  Location: [facility];  Service: Endoscopy;  Laterality: N/A;  830  . Lexiscan Myoview  12/22/2007   No ECG changes, nondiagnostic EKG. Perfusion defect in inferior myocardial region consistent with diaphragmatic attenuation.  . TUBAL LIGATION      Family History  Problem Relation Age of Onset  . Diabetes Mother   . Hypertension Mother   . Hyperlipidemia Mother   . Heart failure Other   . Hypertension Other   . Diabetes Other   . Hyperlipidemia  Father   . Heart disease Sister   . Cancer Maternal Aunt        breast  . Breast cancer Maternal Aunt   . Cancer Maternal Grandmother        breast  . Diabetes Maternal Grandmother   . Hypertension Paternal Grandmother   . Heart failure Paternal Grandmother   . Breast cancer Paternal Grandmother   . Fibroids Sister    Social History   Tobacco Use  . Smoking status: Never Smoker  . Smokeless tobacco: Never  Used  Substance Use Topics  . Alcohol use: No  . Drug use: No    Allergies  Allergen Reactions  . Avelox [Moxifloxacin Hcl In Nacl]   . Quinolones     Hallucinations, sleeplessness, rash  . Statins     Myalgias (high doses only)  . Latex Rash  . Metoprolol Rash  . Sulfa Antibiotics Rash    No outpatient medications have been marked as taking for the 02/11/20 encounter (Office Visit) with Cairns, Mark A, MD.    Objective: BP (!) 149/85   Pulse 91   Ht 5' 1" (1.549 m)   Wt 132 lb (59.9 kg)   BMI 24.94 kg/m   Physical Exam:  General: Alert and oriented, no acute distress Gait: Unable due to NWB  Left ankle in a splint that is clean and dry.  Exposed toes are WWP.  Sensation intact distally.  Swelling to dorsum of foot.     IMAGING: I personally reviewed images previously obtained from the ED   XR left ankle with displaced lateral and medial malleoli fractures.    New Medications:  No orders of the defined types were placed in this encounter.     Mark A Cairns, MD  02/11/2020 11:37 PM    

## 2020-02-11 NOTE — Telephone Encounter (Signed)
Do you want a card filled out for this patient?

## 2020-02-11 NOTE — Telephone Encounter (Signed)
Is this for her fracture she has had several months back?

## 2020-02-15 ENCOUNTER — Encounter (HOSPITAL_COMMUNITY): Payer: Self-pay

## 2020-02-15 ENCOUNTER — Other Ambulatory Visit (HOSPITAL_COMMUNITY)
Admission: RE | Admit: 2020-02-15 | Discharge: 2020-02-15 | Disposition: A | Discharge status: Home / Self Care | Payer: Medicare Other | Source: Ambulatory Visit | Attending: Orthopedic Surgery | Admitting: Orthopedic Surgery

## 2020-02-15 ENCOUNTER — Encounter (HOSPITAL_COMMUNITY)
Admission: RE | Admit: 2020-02-15 | Discharge: 2020-02-15 | Disposition: A | Discharge status: Home / Self Care | Payer: Medicare Other | Source: Ambulatory Visit | Attending: Orthopedic Surgery | Admitting: Orthopedic Surgery

## 2020-02-15 ENCOUNTER — Telehealth: Payer: Self-pay | Admitting: Orthopedic Surgery

## 2020-02-15 ENCOUNTER — Other Ambulatory Visit: Payer: Self-pay

## 2020-02-15 DIAGNOSIS — Z20822 Contact with and (suspected) exposure to covid-19: Secondary | ICD-10-CM | POA: Insufficient documentation

## 2020-02-15 DIAGNOSIS — Z01812 Encounter for preprocedural laboratory examination: Secondary | ICD-10-CM | POA: Insufficient documentation

## 2020-02-15 HISTORY — DX: Unspecified osteoarthritis, unspecified site: M19.90

## 2020-02-15 LAB — CBC
HCT: 36.9 % (ref 36.0–46.0)
Hemoglobin: 11.7 g/dL — ABNORMAL LOW (ref 12.0–15.0)
MCH: 29.3 pg (ref 26.0–34.0)
MCHC: 31.7 g/dL (ref 30.0–36.0)
MCV: 92.3 fL (ref 80.0–100.0)
Platelets: 248 K/uL (ref 150–400)
RBC: 4 MIL/uL (ref 3.87–5.11)
RDW: 14.7 % (ref 11.5–15.5)
WBC: 10.4 K/uL (ref 4.0–10.5)
nRBC: 0 % (ref 0.0–0.2)

## 2020-02-15 LAB — BASIC METABOLIC PANEL WITH GFR
Anion gap: 10 (ref 5–15)
BUN: 14 mg/dL (ref 8–23)
CO2: 26 mmol/L (ref 22–32)
Calcium: 9.7 mg/dL (ref 8.9–10.3)
Chloride: 103 mmol/L (ref 98–111)
Creatinine, Ser: 0.55 mg/dL (ref 0.44–1.00)
GFR, Estimated: >60 mL/min
Glucose, Bld: 112 mg/dL — ABNORMAL HIGH (ref 70–99)
Potassium: 3.3 mmol/L — ABNORMAL LOW (ref 3.5–5.1)
Sodium: 139 mmol/L (ref 135–145)

## 2020-02-15 NOTE — Telephone Encounter (Signed)
Patient / daughter, designated contact Margreta Journey, called to relay she must have misplaced the prescription for the scooter. Please advise.

## 2020-02-15 NOTE — Telephone Encounter (Signed)
I called the daughter and let her know that I would make a new order for the walker. She did not have any concerns.

## 2020-02-15 NOTE — Patient Instructions (Signed)
Alyssa Navarro  02/15/2020     @PREFPERIOPPHARMACY @   Your procedure is scheduled on 02/17/2020.  Report to [facility] at  [phone]  A.M.  Call this number if you have problems the morning of surgery:  [phone]   Remember:  Do not eat or drink after midnight.                      Take these medicines the morning of surgery with A SIP OF WATER  Carvedilol, amlodipine.    Do not wear jewelry, make-up or nail polish.  Do not wear lotions, powders, or perfumes, or deodorant. Please brush your teeth.  Do not shave 48 hours prior to surgery.  Men may shave face and neck.  Do not bring valuables to the hospital.  [facility] is not responsible for any belongings or valuables.  Contacts, dentures or bridgework may not be worn into surgery.  Leave your suitcase in the car.  After surgery it may be brought to your room.  For patients admitted to the hospital, discharge time will be determined by your treatment team.  Patients discharged the day of surgery will not be allowed to drive home.   Name and phone number of your driver:   family Special instructions:  None  Please read over the following fact sheets that you were given. Anesthesia Post-op Instructions and Care and Recovery After Surgery       Displaced Bimalleolar Ankle Fracture Treated With ORIF, Care After This sheet gives you information about how to care for yourself after your procedure. Your health care provider may also give you more specific instructions. If you have problems or questions, contact your health care provider. What can I expect after the procedure? After the procedure, it is common to have:  Pain.  Swelling.  A small amount of fluid from your incision. Follow these instructions at home: Medicines  Take over-the-counter and prescription medicines only as told by your health care provider.  Ask your health care provider if the medicine prescribed to you: ? Requires you to avoid  driving or using machinery. ? Can cause constipation. You may need to take these actions to prevent or treat constipation:  Drink enough fluid to keep your urine pale yellow.  Take over-the-counter or prescription medicines.  Eat foods that are high in fiber, such as beans, whole grains, and fresh fruits and vegetables.  Limit foods that are high in fat and processed sugars, such as fried or sweet foods. If you have a splint or boot:  Wear the splint or boot as told by your health care provider. Remove it only as told by your health care provider.  Loosen the splint or boot if your toes tingle, become numb, or turn cold and blue.  Keep the splint or boot clean. If you have a cast:  Do not stick anything inside the cast to scratch your skin. Doing that increases your risk of infection.  Check the skin around the cast every day. Tell your health care provider about any concerns.  You may put lotion on dry skin around the edges of the cast. Do not put lotion on the skin underneath the cast.  Keep the cast clean. Bathing  Do not take baths, swim, or use a hot tub until your health care provider approves. Ask your health care provider if you may take showers. You may only be allowed to take sponge baths.  If your splint, boot, or cast is not waterproof: ? Do not let it get wet. ? Cover it with a watertight covering when you take a bath or a shower.  Keep the bandage (dressing) dry until your health care provider says it can be removed. Incision care  Follow instructions from your health care provider about how to take care of your incision. Make sure you: ? Wash your hands with soap and water for at least 20 seconds before and after you change your dressing. If soap and water are not available, use hand sanitizer. ? Change your dressing as told by your health care provider. ? Leave stitches (sutures), skin glue, or adhesive strips in place. These skin closures may need to stay in  place for 2 weeks or longer. If adhesive strip edges start to loosen and curl up, you may trim the loose edges. Do not remove adhesive strips completely unless your health care provider tells you to do that.  Check your incision area every day for signs of infection. Check for: ? Redness. ? More pain or more swelling. ? Blood or more fluid. ? Warmth. ? Pus or a bad smell.   Managing pain, stiffness, and swelling  If directed, put ice on the affected area. To do this: ? If you have a removable splint or boot, remove it as told by your health care provider. ? Put ice in a plastic bag. ? Place a towel between your skin and the bag, or between your cast and the bag. ? Leave the ice on for 20 minutes, 2-3 times a day. ? Remove the ice if your skin turns bright red. This is very important. If you cannot feel pain, heat, or cold, you have a greater risk of damage to the area.  Move your toes often to reduce stiffness and swelling.  Raise (elevate) the injured area above the level of your heart while you are sitting or lying down. To do this, try putting a few pillows under your leg and ankle.   Activity  Do not use your injured limb to support (bear) your body weight until your health care provider says that you can. Follow weight-bearing restrictions as told. Use crutches or other assistive devicesto help you move around as told by your health care provider.  Ask your health care provider when it is safe to drive if you have a splint, boot, or cast on your foot.  Do exercises as told by your health care provider or physical therapist.  Return to your normal activities as told by your health care provider. Ask your health care provider what activities are safe for you. General instructions  Do not put pressure on any part of the splint or cast until it is fully hardened, if applicable. This may take several hours.  Do not use any products that contain nicotine or tobacco, such as  cigarettes, e-cigarettes, and chewing tobacco. These can delay bone healing. If you need help quitting, ask your health care provider.  Keep all follow-up visits. This is important. Contact a health care provider if:  You have a fever.  Your pain medicine is not helping.  You have redness around your incision.  You have more swelling or severe pain around your incision.  You have more fluid or blood coming from your incision or leaking through your cast.  Your incision feels warm to the touch.  You have pus or a bad smell coming from your incision or from your cast  or dressing. Get help right away if:  The edges of your incision come apart after the stitches or staples have been taken out.  You have chest pain.  You have trouble breathing.  Your foot or leg feels numb or tingles.  Your foot becomes cold, pale, or blue.  You have calf swelling or tenderness. These symptoms may represent a serious problem that is an emergency. Do not wait to see if the symptoms will go away. Get medical help right away. Call your local emergency services (911 in the [place] Do not drive yourself to the hospital. Summary  After the procedure, it is common to have pain and swelling.  If your splint, boot, or cast is not waterproof, do not let it get wet.  Contact your health care provider if you have more swelling or severe pain, or if you have more fluids coming from your incision or leaking through your cast.  Get help right away if you have numbness or tingling in your foot or leg, or if your foot becomes cold, pale, or blue. This information is not intended to replace advice given to you by your health care provider. Make sure you discuss any questions you have with your health care provider. Document Revised: 04/26/2019 Document Reviewed: 04/26/2019 Elsevier Patient Education  2021 [place] Anesthesia, Adult, Care After This sheet gives you information about how to care for  yourself after your procedure. Your health care provider may also give you more specific instructions. If you have problems or questions, contact your health care provider. What can I expect after the procedure? After the procedure, the following side effects are common:  Pain or discomfort at the IV site.  Nausea.  Vomiting.  Sore throat.  Trouble concentrating.  Feeling cold or chills.  Feeling weak or tired.  Sleepiness and fatigue.  Soreness and body aches. These side effects can affect parts of the body that were not involved in surgery. Follow these instructions at home: For the time period you were told by your health care provider:  Rest.  Do not participate in activities where you could fall or become injured.  Do not drive or use machinery.  Do not drink alcohol.  Do not take sleeping pills or medicines that cause drowsiness.  Do not make important decisions or sign legal documents.  Do not take care of children on your own.   Eating and drinking  Follow any instructions from your health care provider about eating or drinking restrictions.  When you feel hungry, start by eating small amounts of foods that are soft and easy to digest (bland), such as toast. Gradually return to your regular diet.  Drink enough fluid to keep your urine pale yellow.  If you vomit, rehydrate by drinking water, juice, or clear broth. General instructions  If you have sleep apnea, surgery and certain medicines can increase your risk for breathing problems. Follow instructions from your health care provider about wearing your sleep device: ? Anytime you are sleeping, including during daytime naps. ? While taking prescription pain medicines, sleeping medicines, or medicines that make you drowsy.  Have a responsible adult stay with you for the time you are told. It is important to have someone help care for you until you are awake and alert.  Return to your normal activities as  told by your health care provider. Ask your health care provider what activities are safe for you.  Take over-the-counter and prescription medicines only as told by  your health care provider.  If you smoke, do not smoke without supervision.  Keep all follow-up visits as told by your health care provider. This is important. Contact a health care provider if:  You have nausea or vomiting that does not get better with medicine.  You cannot eat or drink without vomiting.  You have pain that does not get better with medicine.  You are unable to pass urine.  You develop a skin rash.  You have a fever.  You have redness around your IV site that gets worse. Get help right away if:  You have difficulty breathing.  You have chest pain.  You have blood in your urine or stool, or you vomit blood. Summary  After the procedure, it is common to have a sore throat or nausea. It is also common to feel tired.  Have a responsible adult stay with you for the time you are told. It is important to have someone help care for you until you are awake and alert.  When you feel hungry, start by eating small amounts of foods that are soft and easy to digest (bland), such as toast. Gradually return to your regular diet.  Drink enough fluid to keep your urine pale yellow.  Return to your normal activities as told by your health care provider. Ask your health care provider what activities are safe for you. This information is not intended to replace advice given to you by your health care provider. Make sure you discuss any questions you have with your health care provider. Document Revised: 10/07/2019 Document Reviewed: 05/06/2019 Elsevier Patient Education  2021 [employer].

## 2020-02-16 LAB — SARS CORONAVIRUS 2 (TAT 6-24 HRS): SARS Coronavirus 2: NEGATIVE

## 2020-02-16 NOTE — Telephone Encounter (Signed)
Dr. Amedeo Kinsman signed the form and I have placed at the front desk.

## 2020-02-17 ENCOUNTER — Ambulatory Visit (HOSPITAL_COMMUNITY): Payer: Medicare Other

## 2020-02-17 ENCOUNTER — Encounter (HOSPITAL_COMMUNITY): Payer: Self-pay | Admitting: Orthopedic Surgery

## 2020-02-17 ENCOUNTER — Encounter (HOSPITAL_COMMUNITY)
Admission: RE | Disposition: A | Discharge status: Home / Self Care | Payer: Self-pay | Source: Home / Self Care | Attending: Orthopedic Surgery

## 2020-02-17 ENCOUNTER — Ambulatory Visit (HOSPITAL_COMMUNITY)
Admission: RE | Admit: 2020-02-17 | Discharge: 2020-02-17 | Disposition: A | Discharge status: Home / Self Care | Payer: Medicare Other | Attending: Orthopedic Surgery | Admitting: Orthopedic Surgery

## 2020-02-17 ENCOUNTER — Ambulatory Visit (HOSPITAL_COMMUNITY): Payer: Medicare Other | Admitting: Anesthesiology

## 2020-02-17 DIAGNOSIS — Z955 Presence of coronary angioplasty implant and graft: Secondary | ICD-10-CM | POA: Insufficient documentation

## 2020-02-17 DIAGNOSIS — W000XXA Fall on same level due to ice and snow, initial encounter: Secondary | ICD-10-CM | POA: Diagnosis not present

## 2020-02-17 DIAGNOSIS — Z8349 Family history of other endocrine, nutritional and metabolic diseases: Secondary | ICD-10-CM | POA: Diagnosis not present

## 2020-02-17 DIAGNOSIS — I10 Essential (primary) hypertension: Secondary | ICD-10-CM | POA: Diagnosis not present

## 2020-02-17 DIAGNOSIS — S82892A Other fracture of left lower leg, initial encounter for closed fracture: Secondary | ICD-10-CM | POA: Diagnosis not present

## 2020-02-17 DIAGNOSIS — Z881 Allergy status to other antibiotic agents status: Secondary | ICD-10-CM | POA: Insufficient documentation

## 2020-02-17 DIAGNOSIS — Z8249 Family history of ischemic heart disease and other diseases of the circulatory system: Secondary | ICD-10-CM | POA: Diagnosis not present

## 2020-02-17 DIAGNOSIS — Z9889 Other specified postprocedural states: Secondary | ICD-10-CM

## 2020-02-17 DIAGNOSIS — I2510 Atherosclerotic heart disease of native coronary artery without angina pectoris: Secondary | ICD-10-CM | POA: Diagnosis not present

## 2020-02-17 DIAGNOSIS — S8252XD Displaced fracture of medial malleolus of left tibia, subsequent encounter for closed fracture with routine healing: Secondary | ICD-10-CM | POA: Diagnosis not present

## 2020-02-17 DIAGNOSIS — Z803 Family history of malignant neoplasm of breast: Secondary | ICD-10-CM | POA: Diagnosis not present

## 2020-02-17 DIAGNOSIS — Z833 Family history of diabetes mellitus: Secondary | ICD-10-CM | POA: Diagnosis not present

## 2020-02-17 DIAGNOSIS — S82832D Other fracture of upper and lower end of left fibula, subsequent encounter for closed fracture with routine healing: Secondary | ICD-10-CM | POA: Diagnosis not present

## 2020-02-17 DIAGNOSIS — Z888 Allergy status to other drugs, medicaments and biological substances status: Secondary | ICD-10-CM | POA: Insufficient documentation

## 2020-02-17 DIAGNOSIS — S82842A Displaced bimalleolar fracture of left lower leg, initial encounter for closed fracture: Secondary | ICD-10-CM | POA: Diagnosis not present

## 2020-02-17 DIAGNOSIS — Z91040 Latex allergy status: Secondary | ICD-10-CM | POA: Insufficient documentation

## 2020-02-17 DIAGNOSIS — Z882 Allergy status to sulfonamides status: Secondary | ICD-10-CM | POA: Insufficient documentation

## 2020-02-17 DIAGNOSIS — Z8781 Personal history of (healed) traumatic fracture: Secondary | ICD-10-CM

## 2020-02-17 DIAGNOSIS — G8918 Other acute postprocedural pain: Secondary | ICD-10-CM | POA: Diagnosis not present

## 2020-02-17 DIAGNOSIS — S82845A Nondisplaced bimalleolar fracture of left lower leg, initial encounter for closed fracture: Secondary | ICD-10-CM | POA: Diagnosis not present

## 2020-02-17 HISTORY — PX: ORIF ANKLE FRACTURE: SHX5408

## 2020-02-17 HISTORY — PX: ANKLE FRACTURE SURGERY: SHX122

## 2020-02-17 SURGERY — OPEN REDUCTION INTERNAL FIXATION (ORIF) ANKLE FRACTURE
Anesthesia: General | Site: Ankle | Laterality: Left

## 2020-02-17 MED ORDER — ACETAMINOPHEN 500 MG PO TABS: 1000.0000 mg | ORAL_TABLET | Freq: Three times a day (TID) | ORAL | 0 refills | Status: AC

## 2020-02-17 MED ORDER — GABAPENTIN 100 MG PO CAPS: 100.0000 mg | ORAL_CAPSULE | Freq: Three times a day (TID) | ORAL | 0 refills | Status: DC

## 2020-02-17 MED ORDER — FENTANYL CITRATE (PF) 100 MCG/2ML IJ SOLN
INTRAMUSCULAR | Status: DC | PRN
  Administered 2020-02-17 (×2): 25 ug via INTRAVENOUS

## 2020-02-17 MED ORDER — MIDAZOLAM HCL 2 MG/2ML IJ SOLN
INTRAMUSCULAR | Status: AC
  Filled 2020-02-17: qty 2

## 2020-02-17 MED ORDER — BUPIVACAINE-EPINEPHRINE (PF) 0.5% -1:200000 IJ SOLN
INTRAMUSCULAR | Status: AC
  Filled 2020-02-17: qty 30

## 2020-02-17 MED ORDER — BUPIVACAINE HCL (PF) 0.5 % IJ SOLN
INTRAMUSCULAR | Status: DC | PRN
  Administered 2020-02-17: 14 mL via PERINEURAL

## 2020-02-17 MED ORDER — LIDOCAINE HCL (PF) 1 % IJ SOLN
INTRAMUSCULAR | Status: AC
  Filled 2020-02-17: qty 30

## 2020-02-17 MED ORDER — 0.9 % SODIUM CHLORIDE (POUR BTL) OPTIME
TOPICAL | Status: DC | PRN
  Administered 2020-02-17 (×2): 1000 mL

## 2020-02-17 MED ORDER — BUPIVACAINE-EPINEPHRINE (PF) 0.25% -1:200000 IJ SOLN
INTRAMUSCULAR | Status: AC
  Filled 2020-02-17: qty 30

## 2020-02-17 MED ORDER — MEPERIDINE HCL 50 MG/ML IJ SOLN: 6.2500 mg | INTRAMUSCULAR | Status: DC | PRN

## 2020-02-17 MED ORDER — DEXAMETHASONE SODIUM PHOSPHATE 4 MG/ML IJ SOLN
INTRAMUSCULAR | Status: AC
  Filled 2020-02-17: qty 2

## 2020-02-17 MED ORDER — FENTANYL CITRATE (PF) 100 MCG/2ML IJ SOLN
INTRAMUSCULAR | Status: AC
  Filled 2020-02-17: qty 2

## 2020-02-17 MED ORDER — BUPIVACAINE-EPINEPHRINE (PF) 0.25% -1:200000 IJ SOLN
INTRAMUSCULAR | Status: DC | PRN
  Administered 2020-02-17: 15 mL via PERINEURAL
  Administered 2020-02-17: 7 mL via PERINEURAL

## 2020-02-17 MED ORDER — ONDANSETRON HCL 4 MG/2ML IJ SOLN
INTRAMUSCULAR | Status: DC | PRN
  Administered 2020-02-17: 4 mg via INTRAVENOUS

## 2020-02-17 MED ORDER — CEFAZOLIN SODIUM-DEXTROSE 2-4 GM/100ML-% IV SOLN
2.0000 g | INTRAVENOUS | Status: AC
  Administered 2020-02-17: 2 g via INTRAVENOUS
  Filled 2020-02-17: qty 100

## 2020-02-17 MED ORDER — FENTANYL CITRATE (PF) 100 MCG/2ML IJ SOLN
50.0000 ug | INTRAMUSCULAR | Status: DC | PRN
  Filled 2020-02-17: qty 2

## 2020-02-17 MED ORDER — ORAL CARE MOUTH RINSE: 15.0000 mL | Freq: Once | OROMUCOSAL | Status: AC

## 2020-02-17 MED ORDER — PROMETHAZINE HCL 25 MG/ML IJ SOLN: 6.2500 mg | INTRAMUSCULAR | Status: DC | PRN

## 2020-02-17 MED ORDER — LIDOCAINE HCL (PF) 1 % IJ SOLN
INTRAMUSCULAR | Status: DC | PRN
  Administered 2020-02-17 (×2): 3 mL

## 2020-02-17 MED ORDER — ROPIVACAINE HCL 5 MG/ML IJ SOLN
INTRAMUSCULAR | Status: AC
  Filled 2020-02-17: qty 30

## 2020-02-17 MED ORDER — LIDOCAINE HCL (PF) 2 % IJ SOLN
INTRAMUSCULAR | Status: AC
  Filled 2020-02-17: qty 5

## 2020-02-17 MED ORDER — BUPIVACAINE HCL (PF) 0.5 % IJ SOLN
INTRAMUSCULAR | Status: AC
  Filled 2020-02-17: qty 30

## 2020-02-17 MED ORDER — ONDANSETRON HCL 4 MG PO TABS: 4.0000 mg | ORAL_TABLET | Freq: Three times a day (TID) | ORAL | 0 refills | Status: AC | PRN

## 2020-02-17 MED ORDER — ONDANSETRON HCL 4 MG/2ML IJ SOLN
INTRAMUSCULAR | Status: AC
  Filled 2020-02-17: qty 2

## 2020-02-17 MED ORDER — MIDAZOLAM HCL 2 MG/2ML IJ SOLN
2.0000 mg | Freq: Once | INTRAMUSCULAR | Status: AC
  Administered 2020-02-17: 2 mg via INTRAVENOUS
  Filled 2020-02-17: qty 2

## 2020-02-17 MED ORDER — BUPIVACAINE HCL (PF) 0.25 % IJ SOLN
INTRAMUSCULAR | Status: AC
  Filled 2020-02-17: qty 30

## 2020-02-17 MED ORDER — PROPOFOL 10 MG/ML IV BOLUS
INTRAVENOUS | Status: DC | PRN
  Administered 2020-02-17: 50 mg via INTRAVENOUS
  Administered 2020-02-17: 150 mg via INTRAVENOUS

## 2020-02-17 MED ORDER — LIDOCAINE HCL (CARDIAC) PF 100 MG/5ML IV SOSY
PREFILLED_SYRINGE | INTRAVENOUS | Status: DC | PRN
  Administered 2020-02-17: 80 mg via INTRAVENOUS

## 2020-02-17 MED ORDER — LACTATED RINGERS IV SOLN
Freq: Once | INTRAVENOUS | Status: AC
  Administered 2020-02-17: 1000 mL via INTRAVENOUS

## 2020-02-17 MED ORDER — BUPIVACAINE HCL (PF) 0.25 % IJ SOLN
INTRAMUSCULAR | Status: DC | PRN
  Administered 2020-02-17: 7 mL

## 2020-02-17 MED ORDER — DEXAMETHASONE SODIUM PHOSPHATE 4 MG/ML IJ SOLN
INTRAMUSCULAR | Status: DC | PRN
  Administered 2020-02-17 (×2): 4 mg via PERINEURAL

## 2020-02-17 MED ORDER — LACTATED RINGERS IV SOLN: INTRAVENOUS | Status: DC | PRN

## 2020-02-17 MED ORDER — BUPIVACAINE-EPINEPHRINE (PF) 0.25% -1:200000 IJ SOLN: INTRAMUSCULAR | Status: DC | PRN

## 2020-02-17 MED ORDER — DEXAMETHASONE SODIUM PHOSPHATE 10 MG/ML IJ SOLN
INTRAMUSCULAR | Status: DC | PRN
  Administered 2020-02-17: 5 mg via INTRAVENOUS

## 2020-02-17 MED ORDER — DEXAMETHASONE SODIUM PHOSPHATE 10 MG/ML IJ SOLN
INTRAMUSCULAR | Status: AC
  Filled 2020-02-17: qty 1

## 2020-02-17 MED ORDER — ASPIRIN EC 81 MG PO TBEC: 81.0000 mg | DELAYED_RELEASE_TABLET | Freq: Two times a day (BID) | ORAL | 0 refills | Status: AC

## 2020-02-17 MED ORDER — CHLORHEXIDINE GLUCONATE 0.12 % MT SOLN
15.0000 mL | Freq: Once | OROMUCOSAL | Status: AC
  Administered 2020-02-17: 15 mL via OROMUCOSAL
  Filled 2020-02-17: qty 15

## 2020-02-17 MED ORDER — OXYCODONE HCL 5 MG PO TABS: 5.0000 mg | ORAL_TABLET | ORAL | 0 refills | Status: AC | PRN

## 2020-02-17 MED ORDER — PROPOFOL 10 MG/ML IV BOLUS
INTRAVENOUS | Status: AC
  Filled 2020-02-17: qty 20

## 2020-02-17 MED ORDER — HYDROMORPHONE HCL 1 MG/ML IJ SOLN: 0.2500 mg | INTRAMUSCULAR | Status: DC | PRN

## 2020-02-17 MED ORDER — MELOXICAM 15 MG PO TABS: 15.0000 mg | ORAL_TABLET | Freq: Every day | ORAL | 0 refills | Status: AC

## 2020-02-17 SURGICAL SUPPLY — 80 items
APL PRP STRL LF DISP 70% ISPRP (MISCELLANEOUS) ×1
BANDAGE ELASTIC 4 VELCRO NS (GAUZE/BANDAGES/DRESSINGS) ×4 IMPLANT
BANDAGE ESMARK 4X12 BL STRL LF (DISPOSABLE) ×1 IMPLANT
BIT DRILL 2 CANN GRADUATED (BIT) ×2 IMPLANT
BIT DRILL 2.5 CANN LNG (BIT) ×2 IMPLANT
BIT DRILL 2.6 CANN (BIT) ×2 IMPLANT
BLADE SURG SZ10 CARB STEEL (BLADE) ×2 IMPLANT
BNDG CMPR 12X4 ELC STRL LF (DISPOSABLE) ×1
BNDG CMPR STD VLCR NS LF 5.8X4 (GAUZE/BANDAGES/DRESSINGS) ×1
BNDG COHESIVE 4X5 TAN STRL (GAUZE/BANDAGES/DRESSINGS) ×2 IMPLANT
BNDG ELASTIC 4X5.8 VLCR NS LF (GAUZE/BANDAGES/DRESSINGS) ×2 IMPLANT
BNDG ESMARK 4X12 BLUE STRL LF (DISPOSABLE) ×2
CHLORAPREP W/TINT 26 (MISCELLANEOUS) ×2 IMPLANT
CLOTH BEACON ORANGE TIMEOUT ST (SAFETY) ×2 IMPLANT
COVER LIGHT HANDLE STERIS (MISCELLANEOUS) ×4 IMPLANT
COVER MAYO STAND XLG (MISCELLANEOUS) ×2 IMPLANT
COVER WAND RF STERILE (DRAPES) ×2 IMPLANT
CUFF TOURN SGL QUICK 34 (TOURNIQUET CUFF) ×2
CUFF TRNQT CYL 34X4.125X (TOURNIQUET CUFF) ×1 IMPLANT
DECANTER SPIKE VIAL GLASS SM (MISCELLANEOUS) ×2 IMPLANT
DRAPE C-ARM FOLDED MOBILE STRL (DRAPES) ×2 IMPLANT
DRAPE C-ARMOR (DRAPES) ×2 IMPLANT
DRAPE U-SHAPE 47X51 STRL (DRAPES) ×2 IMPLANT
DRILL 2.6X122MM WL AO SHAFT (BIT) IMPLANT
DRSG XEROFORM 1X8 (GAUZE/BANDAGES/DRESSINGS) ×4 IMPLANT
ELECT REM PT RETURN 9FT ADLT (ELECTROSURGICAL) ×2
ELECTRODE REM PT RTRN 9FT ADLT (ELECTROSURGICAL) ×1 IMPLANT
GAUZE SPONGE 4X4 12PLY STRL (GAUZE/BANDAGES/DRESSINGS) ×2 IMPLANT
GAUZE XEROFORM 1X8 LF (GAUZE/BANDAGES/DRESSINGS) ×2 IMPLANT
GLOVE BIOGEL PI IND STRL 7.0 (GLOVE) ×3 IMPLANT
GLOVE BIOGEL PI IND STRL 8 (GLOVE) ×1 IMPLANT
GLOVE BIOGEL PI INDICATOR 7.0 (GLOVE) ×3
GLOVE BIOGEL PI INDICATOR 8 (GLOVE) ×1
GLOVE SKINSENSE NS SZ8.0 LF (GLOVE) ×2
GLOVE SKINSENSE STRL SZ8.0 LF (GLOVE) ×2 IMPLANT
GLOVE SURG SS PI 6.5 STRL IVOR (GLOVE) ×2 IMPLANT
GLOVE SURG SS PI 7.0 STRL IVOR (GLOVE) ×2 IMPLANT
GOWN STRL REUS W/ TWL XL LVL3 (GOWN DISPOSABLE) ×1 IMPLANT
GOWN STRL REUS W/TWL LRG LVL3 (GOWN DISPOSABLE) ×4 IMPLANT
GOWN STRL REUS W/TWL XL LVL3 (GOWN DISPOSABLE) ×2
GUIDEWIRE 1.35MM (WIRE) ×4 IMPLANT
INST SET MINOR BONE (KITS) ×2 IMPLANT
K-WIRE BB-TAK (WIRE) ×4
KIT TURNOVER KIT A (KITS) ×2 IMPLANT
KWIRE BB-TAK (WIRE) ×2 IMPLANT
MANIFOLD NEPTUNE II (INSTRUMENTS) ×2 IMPLANT
NS IRRIG 1000ML POUR BTL (IV SOLUTION) ×4 IMPLANT
PACK BASIC LIMB (CUSTOM PROCEDURE TRAY) ×2 IMPLANT
PAD ABD 5X9 TENDERSORB (GAUZE/BANDAGES/DRESSINGS) ×12 IMPLANT
PAD ABD 8X10 STRL (GAUZE/BANDAGES/DRESSINGS) ×6 IMPLANT
PAD ARMBOARD 7.5X6 YLW CONV (MISCELLANEOUS) ×2 IMPLANT
PAD CAST 4YDX4 CTTN HI CHSV (CAST SUPPLIES) ×1 IMPLANT
PADDING CAST ABS 4INX4YD NS (CAST SUPPLIES) ×2
PADDING CAST ABS COTTON 4X4 ST (CAST SUPPLIES) ×2 IMPLANT
PADDING CAST COTTON 4X4 STRL (CAST SUPPLIES) ×2
PADDING WEBRIL 4 STERILE (GAUZE/BANDAGES/DRESSINGS) ×8 IMPLANT
PENCIL SMOKE EVACUATOR (MISCELLANEOUS) ×2 IMPLANT
PLATE DISTAL FIBULA 4H LOCKING (Plate) ×2 IMPLANT
SCREW CANN LP LT 4X50 (Screw) ×4 IMPLANT
SCREW LOCKING 2.7X10 ANKLE (Screw) ×2 IMPLANT
SCREW LOCKING 2.7X12 ANKLE (Screw) ×6 IMPLANT
SCREW LOW PROFILE 3.5X14 (Screw) ×2 IMPLANT
SCREW NLOCK CANC 2.7X12 (Miscellaneous) ×2 IMPLANT
SCREW NON-LOCKING 3.5X12MM (Screw) ×4 IMPLANT
SET BASIN LINEN APH (SET/KITS/TRAYS/PACK) ×2 IMPLANT
SPLINT PLASTER CAST XFAST 5X30 (CAST SUPPLIES) ×1 IMPLANT
SPLINT PLASTER XFAST SET 5X30 (CAST SUPPLIES) ×1
SPONGE LAP 18X18 RF (DISPOSABLE) ×2 IMPLANT
STAPLER VISISTAT 35W (STAPLE) IMPLANT
STRIP CLOSURE SKIN 1/2X4 (GAUZE/BANDAGES/DRESSINGS) ×4 IMPLANT
SUT ETHILON 3 0 FSL (SUTURE) IMPLANT
SUT MNCRL AB 4-0 PS2 18 (SUTURE) ×4 IMPLANT
SUT MON AB 0 CT1 (SUTURE) IMPLANT
SUT MON AB 2-0 CT1 36 (SUTURE) ×6 IMPLANT
SUT VIC AB 2-0 CT1 27 (SUTURE)
SUT VIC AB 2-0 CT1 TAPERPNT 27 (SUTURE) IMPLANT
SYR 30ML LL (SYRINGE) IMPLANT
SYR BULB IRRIG 60ML STRL (SYRINGE) ×4 IMPLANT
WASHER (Orthopedic Implant) ×4 IMPLANT
WASHER ORTHO 7X (Orthopedic Implant) ×2 IMPLANT

## 2020-02-17 NOTE — Transfer of Care (Signed)
Immediate Anesthesia Transfer of Care Note  Patient: Alyssa Navarro  Procedure(s) Performed: OPEN REDUCTION INTERNAL FIXATION (ORIF) ANKLE FRACTURE (Left Ankle)  Patient Location: PACU  Anesthesia Type:General  Level of Consciousness: drowsy  Airway & Oxygen Therapy: Patient Spontanous Breathing and Patient connected to nasal cannula oxygen  Post-op Assessment: Report given to RN and Post -op Vital signs reviewed and stable  Post vital signs: Reviewed and stable  Last Vitals:  Vitals Value Taken Time  BP 100/57 02/17/20 1004  Temp 36.8 C 02/17/20 1004  Pulse 80 02/17/20 1005  Resp 20 02/17/20 1005  SpO2 94 % 02/17/20 1005  Vitals shown include unvalidated device data.  Last Pain:  Vitals:   02/17/20 0654  TempSrc: Oral  PainSc: 0-No pain      Patients Stated Pain Goal: 8 (93/81/01 7510)  Complications: No complications documented.

## 2020-02-17 NOTE — Op Note (Signed)
Orthopaedic Surgery Operative Note (CSN: [id number])  Alyssa Navarro  May 02, 1950 Date of Surgery: 02/17/2020   Diagnoses:  Left bimalleolar ankle fracture  Procedure: ORIF of left bimalleolar ankle fracture   Operative Finding Successful completion of the planned procedure.  General weak bone quality.   Some distal fibula comminution.    Post-Op Diagnosis: Same Surgeons:Primary: [name], MD Assistants:  Location: [facility] OR ROOM 4 Anesthesia: General with regional anesthesia Antibiotics: Ancef 2 g Tourniquet time:  Total Tourniquet Time Documented: Thigh (Left) - 108 minutes Total: Thigh (Left) - 108 minutes  Estimated Blood Loss: Minimal Complications: None Specimens: None Implants: Implant Name Type Inv. Item Serial No. Manufacturer Lot No. LRB No. Used Action  PLATE DISTAL FIBULA 4H LOCKING - [id number] Plate PLATE DISTAL FIBULA 4H LOCKING  [employer] STERILE ON SET FROM SPD Left 1 Implanted  SCREW NON-LOCKING 3.5X12MM - [id number] Screw SCREW NON-LOCKING 3.5X12MM  [employer] STERILE ON SET FROM SPD Left 2 Implanted  SCREW LOCKING 2.7X12 ANKLE - [id number] Screw SCREW LOCKING 2.7X12 ANKLE  [employer] STERILE ON SET FROM SPD Left 3 Implanted  SCREW LOW PROFILE 3.5X14 - [id number] Screw SCREW LOW PROFILE 3.5X14  [employer] STERILE ON SET FROM SPD Left 1 Implanted  SCREW LOCKING 2.7X10 ANKLE - [id number] Screw SCREW LOCKING 2.7X10 ANKLE  [employer] STERILE ON SET FROM SPD Left 1 Implanted  4.0 x 67mm cannulated screw    [employer] STERILE ON SET FROM SPD Left 2 Implanted  washer    [employer] STERILE ON SET FROM SPD Left 2 Implanted    Indications for Surgery:   Alyssa Navarro is a 70 y.o. female who sustained an unstable left ankle fracture after slipping in the snow.  Benefits and risks of operative and nonoperative management were discussed prior to surgery with patient/guardian(s) and informed consent form was completed.  Specific risks including infection, need for  additional surgery, nonunion, malunion, bleeding, damage to surrounding structures and more severe complications associated with anesthesia were discussed.    Procedure:   The patient was identified properly. Informed consent was obtained and the surgical site was marked. The patient was taken up to suite where general anesthesia was induced.  The patient was positioned supine on bone foam.  The left leg was prepped and draped in the usual sterile fashion.  Timeout was performed before the beginning of the case.  Tourniquet was used for the above duration.  She received Ancef prior to incision.    We started by making a lateral incision, centered over the distal fibula, in line with the fibula.  We incised through skin and then dissected through subcutaneous soft tissue.  The superficial peroneal nerve was identified crossing over the anterior aspect of the fibula very low.  This was identified and protected throughout the entirety of the case.  We continued our dissection down to the lateral aspect of the fibula.  The fracture site was identified.  There was some comminution at the fracture site.  The fracture was opened, and the callus was removed with a combination of blunt dissection, rongeurs and irrigation.  Using fracture reduction clamps, we are able to achieve an adequate reduction.  This was confirmed on x-ray.  Once were satisfied with the reduction, including the overall length of the fibula, the above referenced plate was selected.  We used fluoroscopy to confirm the positioning of the plate, the reduction, as well as the overall length of the construct.  The plate was  then fixed provisionally with K wires.  Due to the distal extent of the fracture, we were only able to have the distal cluster available for screw fixation.  The bone was subsequently fixed to the shaft initially.  We then attempted to reduce the fracture but due to the comminution, there was some fragments which were not  anatomically aligned.  In addition, the overall bone quality was poor, and it was difficult to maintain the reduction using the fracture clamps.  Unfortunately, some small fracture fragments were created during this process.  The distal screws were then placed, and our fixation was assessed under fluoroscopy.  We were satisfied with the overall reduction.  Additional screws were then placed in the shaft, and the distal cluster was filled with locking screws.  We then turned our attention to the medial malleolus.  A curvilinear incision centered over the fracture site was taken through the skin.  Blunt dissection was carried to the level of the fracture.  There was a venous plexus directly overlying the fracture.  The saphenous vein and nerve were identified and protected throughout this portion of the case.  The fracture was then identified.  Using a key elevator the fracture site was cleared off, will use DeRosier and irrigation to clean up the fracture site.  The fracture fragment was much more anterior than we appreciated on preoperative x-rays.  Nonetheless, the fracture was reduced, and a clamp was placed across the fracture.  2 K wires were then passed across the fracture site in retrograde fashion.  Using fluoroscopy, we confirmed their placement and they were not penetrating the joint.  These K wires were then overdrilled.  We then used the countersink.  2 x 50 mm partially threaded cancellous screws with washers were introduced.  The fracture reduction clamp was removed and we are satisfied with the overall reduction.  We then stressed the syndesmosis, which remained intact.  No further fixation was needed.  Final fluoroscopic imaging demonstrated excellent reduction and fixation of the distal fibula and medial malleolus fragments.  We irrigated the wound copiously.  We then closed the incision in a multilayer fashion with absorbable suture.  Sterile dressing was placed followed by a well padded splint.   Patient was awoken taken to PACU in stable condition.   Post-operative plan:  The patient will be discharged home from the PACU once she has recovered. She will be non weightbearing on the operative extremity   DVT prophylaxis Aspirin 81 mg twice daily for 6 weeks.    Pain control with PRN pain medication preferring oral medicines.   Follow up plan will be scheduled in approximately 10-14 days for incision check and XR.

## 2020-02-17 NOTE — Discharge Instructions (Signed)
Mark A. Amedeo Kinsman, MD [facility] [address] [place],  [place]  [address] Phone: [phone] Fax: [phone]   [place] ? Please keep splint clean dry and intact until followup.  ? You may shower on Post-Op Day #2.  ? You must keep splint dry during this process and may find that a plastic bag taped around the leg or alternatively a towel based bath may be a better option.   ? If you get your splint wet or if it is damaged please contact our clinic.  EXERCISES ? Due to your splint being in place you will not be able to bear weight through your extremity.   ? DO NOT PUT ANY WEIGHT ON YOUR OPERATIVE LEG ? Please use crutches or a walker to avoid weight bearing.   REGIONAL ANESTHESIA (NERVE BLOCKS) . The anesthesia team may have performed a nerve block for you if safe in the setting of your care.  This is a great tool used to minimize pain.  Typically the block may start wearing off overnight but the long acting medicine may last for 3-4 days.  The nerve block wearing off can be a challenging period but please utilize your as needed pain medications to try and manage this period.    POST-OP MEDICATIONS- Multimodal approach to pain control . In general your pain will be controlled with a combination of substances.  Prescriptions unless otherwise discussed are electronically sent to your pharmacy.  This is a carefully made plan we use to minimize narcotic use.     - Meloxicam - Anti-inflammatory medication taken on a scheduled basis  - Acetaminophen - Non-narcotic pain medicine taken on a scheduled basis   - Oxycodone - This is a strong narcotic, to be used only on an "as needed" basis for pain.  -  Aspirin 81mg  - This medicine is used to minimize the risk of blood clots after surgery.             -          Zofran - take as needed for nausea   -          Gabapentin - nerve pain following  block  FOLLOW-UP ? If you develop a Fever (>101.5), Redness or Drainage from the surgical incision site, please call our office to arrange for an evaluation. ? Please call the office to schedule a follow-up appointment for your incision check if you do not already have one, 10-14 days post-operatively.  IF YOU HAVE ANY QUESTIONS, PLEASE FEEL FREE TO CALL OUR OFFICE.  HELPFUL INFORMATION  ? If you had a block, it will wear off between 8-24 hrs postop typically.  This is period when your pain may go from nearly zero to the pain you would have had postop without the block.  This is an abrupt transition but nothing dangerous is happening.  You may take an extra dose of narcotic when this happens.  ? You should wean off your narcotic medicines as soon as you are able.  Most patients will be off or using minimal narcotics before their first postop appointment.   ? Elevating your leg will help with swelling and pain control.  You are encouraged to elevate your leg as much as possible in the first couple of weeks following surgery.  Imagine a drop of water on your toe, and your goal is to get that water back to your  heart.  ? We suggest you use the pain medication the first night prior to going to bed, in order to ease any pain when the anesthesia wears off. You should avoid taking pain medications on an empty stomach as it will make you nauseous.  ? Do not drink alcoholic beverages or take illicit drugs when taking pain medications.  ? In most states it is against the law to drive while you are in a splint or sling.  And certainly against the law to drive while taking narcotics.  ? You may return to work/school in the next couple of days when you feel up to it.   ? Pain medication may make you constipated.  Below are a few solutions to try in this order: - Decrease the amount of pain medication if you aren't having pain. - Drink lots of decaffeinated fluids. - Drink prune juice and/or each dried  prunes  o If the first 3 don't work start with additional solutions - Take Colace - an over-the-counter stool softener - Take Senokot - an over-the-counter laxative - Take Miralax - a stronger over-the-counter laxative    Regional Anesthesia  Regional anesthesia is a method used to temporarily block feeling in one area of the body. You may have regional anesthesia before a medical procedure or surgery. A health care provider who specializes in giving anesthesia (anesthesiologist) injects a type of medicine near a nerve or a group of nerves. This medicine makes that area of the body numb. Regional anesthesia allows you to be awake during the procedure or surgery but keeps you from feeling pain in the affected area. There are three types of regional anesthesia:  Spinal anesthesia. This is a one-time injection of medicine into the fluid that surrounds your spinal cord. This numbs the area below and slightly above the injection site.  Epidural anesthesia. This is another medicine that may be placed into your back, but just outside of the protective tissue that covers your spinal cord. Instead of a one-time injection, the medicine is often given gradually over time through a small tube (catheter)that remains in your back for as long as pain control is needed.  Peripheral nerve block. This is an injection that is given in an area of the body other than the spine to block all feeling below the injection site. Peripheral nerve blocks may be given as a single injection before your procedure or may be given through a catheter for as long as you need pain control. Regional anesthesia can be used alone or in combination with other types of anesthesia. Compared to using medicine that makes you fall asleep (general anesthetic), regional anesthesia has many benefits, such as:  Improved pain control after your surgery.  Less nausea, vomiting, or drowsiness after surgery.  A faster recovery. Tell a health  care provider about:  Any allergies you have.  All medicines you are taking, including vitamins, herbs, eye drops, creams, and over-the-counter medicines.  Any use of drugs, alcohol, or tobacco.  Any problems you or family members have had with anesthetic medicines.  Any blood disorders you have.  Any surgeries you have had.  Any medical conditions you have or have had, especially heart failure, chronic obstructive pulmonary disease (COPD), or sleep apnea.  Whether you are pregnant or may be pregnant. What are the risks? Generally, this is a safe procedure. However, problems may occur, including:  Pain.  Nausea.  Vomiting.  Itching.  Low blood pressure.  Headache.  Nerve damage.  Infection.  Bleeding around the injection site.  Trouble urinating.  Allergic reactions to medicine. What happens before the procedure? Staying hydrated Follow instructions from your health care provider about hydration, which may include:  Up to 2 hours before the procedure - you may continue to drink clear liquids, such as water, clear fruit juice, black coffee, and plain tea. Eating and drinking restrictions Follow instructions from your health care provider about eating and drinking, which may include:  8 hours before the procedure - stop eating heavy meals or foods, such as meat, fried foods, or fatty foods.  6 hours before the procedure - stop eating light meals or foods, such as toast or cereal.  6 hours before the procedure - stop drinking milk or drinks that contain milk.  2 hours before the procedure - stop drinking clear liquids. Medicines Ask your health care provider about:  Changing or stopping your regular medicines. This is especially important if you are taking diabetes medicines or blood thinners.  Taking medicines such as aspirin and ibuprofen. These medicines can thin your blood. Do not take these medicines unless your health care provider tells you to take  them.  Taking over-the-counter medicines, vitamins, herbs, and supplements. General instructions  Plan to have a responsible adult take you home from the hospital or clinic.  If you will be going home right after the procedure, plan to have a responsible adult care for you for the time you are told. This is important.  You may need to have blood or imaging tests.  Ask your health care provider what steps will be taken to help prevent infection. These may include washing skin with a germ-killing soap.  If you use a sleep apnea device, ask your health care provider whether you should bring it with you on the day of your surgery. What happens during the procedure?  Depending on the medical procedure you are having done, an IV may be inserted into one of your veins.  The anesthesiologist will do a physical exam to find the best location to give the regional anesthesia. To locate the nerve, he or she may also use: ? A device that activates the nerve and causes your muscles to twitch (nerve stimulator). ? An imaging tool that uses sound waves to create images of the area (ultrasound).  You may be given a medicine to help you relax (sedative).  A medicine called a local anesthetic may be injected to numb the area where the regional anesthetic will be injected.  You will get regional anesthesia by injection or through a catheter.  The anesthesiologist will check to make sure the medicine is working before the rest of your medical procedure begins.  Depending on the type of regional anesthesia you received, you may have a small bandage (dressing) placed over the injection site. The procedure may vary among health care providers and hospitals. What can I expect after the procedure? After your procedure, it is common to have:  Sleepiness.  Nausea.  Itching.  Numbness.  Shivering or feeling cold. Your blood pressure, heart rate, breathing rate, and blood oxygen level will be monitored  until you leave the hospital or clinic. Follow these instructions at home:  If you were given a sedative during the procedure, it can affect you for several hours. Do not drive or operate machinery until your health care provider says that it is safe.  Take over-the-counter and prescription medicines only as told by your health care provider.  Do not  drive, exercise, or do any other activities that require coordination as told by your health care provider. Ask your health care provider when you can return to your usual activities.  Drink enough fluid to keep your urine pale yellow.  If you had a dressing placed over the injection site, only remove it when told to do so by your health care provider.  Keep all follow-up visits as told by your health care provider. This is important. Contact a health care provider if you:  Continue to have nausea and vomiting for more than 1 day.  Develop a rash.  Have trouble urinating. Get help right away if you:  Have bleeding from the injection site or bleeding under the skin at the injection site.  Have redness, swelling, or pain around your injection site.  Have a fever.  Develop a headache.  Develop new numbness or weakness. Summary  Regional anesthesia is a method used to temporarily block feeling in one area of the body. It may be done to block pain during a medical procedure or surgery.  Follow instructions from your health care provider about taking medicines and about eating and drinking before the procedure.  Ask your health care provider when you can return to your usual activities after the procedure. This information is not intended to replace advice given to you by your health care provider. Make sure you discuss any questions you have with your health care provider. Document Revised: 05/21/2019 Document Reviewed: 03/09/2018 Elsevier Patient Education  2021 [place] Anesthesia, Adult, Care After This sheet  gives you information about how to care for yourself after your procedure. Your health care provider may also give you more specific instructions. If you have problems or questions, contact your health care provider. What can I expect after the procedure? After the procedure, the following side effects are common:  Pain or discomfort at the IV site.  Nausea.  Vomiting.  Sore throat.  Trouble concentrating.  Feeling cold or chills.  Feeling weak or tired.  Sleepiness and fatigue.  Soreness and body aches. These side effects can affect parts of the body that were not involved in surgery. Follow these instructions at home: For the time period you were told by your health care provider:  Rest.  Do not participate in activities where you could fall or become injured.  Do not drive or use machinery.  Do not drink alcohol.  Do not take sleeping pills or medicines that cause drowsiness.  Do not make important decisions or sign legal documents.  Do not take care of children on your own.   Eating and drinking  Follow any instructions from your health care provider about eating or drinking restrictions.  When you feel hungry, start by eating small amounts of foods that are soft and easy to digest (bland), such as toast. Gradually return to your regular diet.  Drink enough fluid to keep your urine pale yellow.  If you vomit, rehydrate by drinking water, juice, or clear broth. General instructions  If you have sleep apnea, surgery and certain medicines can increase your risk for breathing problems. Follow instructions from your health care provider about wearing your sleep device: ? Anytime you are sleeping, including during daytime naps. ? While taking prescription pain medicines, sleeping medicines, or medicines that make you drowsy.  Have a responsible adult stay with you for the time you are told. It is important to have someone help care for you until you are  awake and  alert.  Return to your normal activities as told by your health care provider. Ask your health care provider what activities are safe for you.  Take over-the-counter and prescription medicines only as told by your health care provider.  If you smoke, do not smoke without supervision.  Keep all follow-up visits as told by your health care provider. This is important. Contact a health care provider if:  You have nausea or vomiting that does not get better with medicine.  You cannot eat or drink without vomiting.  You have pain that does not get better with medicine.  You are unable to pass urine.  You develop a skin rash.  You have a fever.  You have redness around your IV site that gets worse. Get help right away if:  You have difficulty breathing.  You have chest pain.  You have blood in your urine or stool, or you vomit blood. Summary  After the procedure, it is common to have a sore throat or nausea. It is also common to feel tired.  Have a responsible adult stay with you for the time you are told. It is important to have someone help care for you until you are awake and alert.  When you feel hungry, start by eating small amounts of foods that are soft and easy to digest (bland), such as toast. Gradually return to your regular diet.  Drink enough fluid to keep your urine pale yellow.  Return to your normal activities as told by your health care provider. Ask your health care provider what activities are safe for you. This information is not intended to replace advice given to you by your health care provider. Make sure you discuss any questions you have with your health care provider. Document Revised: 10/07/2019 Document Reviewed: 05/06/2019 Elsevier Patient Education  2021 [place].    Oxycodone Capsules or Tablets What is this medicine? OXYCODONE (ox i KOE done) is a pain reliever, also called an opioid. It treats severe pain. This medicine may be used for  other purposes; ask your health care provider or pharmacist if you have questions. COMMON BRAND NAME(S): Dazidox, Endocodone, Oxaydo, OXECTA, OxyIR, Percolone, Roxicodone, Roxybond What should I tell my health care provider before I take this medicine? They need to know if you have any of these conditions:  brain tumor  drug abuse or addiction  head injury  heart disease  if you often drink alcohol  kidney disease  liver disease  low adrenal gland function  lung disease, asthma, or breathing problem  seizures  stomach or intestine problems  taken an MAOI such as Marplan, Nardil, or Parnate in the last 14 days  an unusual or allergic reaction to oxycodone, other drugs, foods, dyes, or preservatives  pregnant or trying to get pregnant  breast-feeding How should I use this medicine? Take this medicine by mouth with water. Take it as directed on the prescription label at the same time every day. You can take it with or without food. If it upsets your stomach, take it with food. Keep taking it unless your health care provider tells you to stop. Some brands of this medicine, like Oxaydo, have special instructions. Ask your doctor or pharmacist if these directions are for you: Do not cut, crush or chew this medicine. Do not wet, soak, or lick the tablet before you take it. A special MedGuide will be given to you by the pharmacist with each prescription and refill. Be sure to  read this information carefully each time. Talk to your pediatrician regarding the use of this medicine in children. Special care may be needed. Overdosage: If you think you have taken too much of this medicine contact a poison control center or emergency room at once. NOTE: This medicine is only for you. Do not share this medicine with others. What if I miss a dose? If you miss a dose, take it as soon as you can. If it is almost time for your next dose, take only that dose. Do not take double or extra  doses. What may interact with this medicine? Do not take this medicine with any of the following medications:  safinamide This medicine may interact with the following medications:  alcohol  antihistamines for allergy, cough, and cold  atropine  certain antivirals for HIV or hepatitis  certain antibiotics like clarithromycin, erythromycin, linezolid, rifampin  certain medicines for anxiety or sleep  certain medicines for bladder problems like oxybutynin, tolterodine  certain medicines for depression like amitriptyline, fluoxetine, sertraline  certain medicines for fungal infections like ketoconazole, itraconazole, posaconazole  certain medicines for migraine headache like almotriptan, eletriptan, frovatriptan, naratriptan, rizatriptan, sumatriptan, zolmitriptan  certain medicines for nausea or vomiting like dolasetron, granisetron, ondansetron, palonosetron  certain medicines for Parkinson's disease like benztropine, trihexyphenidyl  certain medicines for seizures like carbamazepine, phenobarbital, phenytoin, primidone  certain medicines for stomach problems like dicyclomine, hyoscyamine  certain medicines for travel sickness like scopolamine  diuretics  general anesthetics like halothane, isoflurane, methoxyflurane, propofol  ipratropium  MAOIs like Marplan, Nardil, and Parnate  medicines that relax muscles  methylene blue  other narcotic medicines for pain or cough  phenothiazines like chlorpromazine, mesoridazine, prochlorperazine, thioridazine This list may not describe all possible interactions. Give your health care provider a list of all the medicines, herbs, non-prescription drugs, or dietary supplements you use. Also tell them if you smoke, drink alcohol, or use illegal drugs. Some items may interact with your medicine. What should I watch for while using this medicine? Tell your health care provider if your pain does not go away, if it gets worse, or if  you have new or a different type of pain. You may develop tolerance to this medicine. Tolerance means that you will need a higher dose of the medicine for pain relief. Tolerance is normal and is expected if you take this medicine for a long time. Do not suddenly stop taking your medicine because you may develop a severe reaction. Your body becomes used to the medicine. This does NOT mean you are addicted. Addiction is a behavior related to getting and using a medicine for a nonmedical reason. If you have pain, you have a medical reason to take pain medicine. Your health care provider will tell you how much medicine to take. If your health care provider wants you to stop the medicine, the dose will be slowly lowered over time to avoid any side effects. If you take other medicines that also cause drowsiness such as other narcotic pain medicines, benzodiazepines, or other medicines for sleep, you may have more side effects. Give your health care provider a list of all medicines you use. He or she will tell you how much medicine to take. Do not take more medicine than directed. Get emergency help right away if you have trouble breathing or are unusually tired or sleepy. Talk to your health care provider about naloxone and how to get it. Naloxone is an emergency medicine used for an opioid overdose. An overdose  can happen if you take too much opioid. It can also happen if an opioid is taken with some other medicines or substances, such as alcohol. Know the symptoms of an overdose, such as trouble breathing, unusually tired or sleepy, or not being able to respond or wake up. Make sure to tell caregivers and close contacts where it is stored. Make sure they know how to use it. After naloxone is given, you must get emergency help right away. Naloxone is a temporary treatment. Repeat doses may be needed. You may get drowsy or dizzy. Do not drive, use machinery, or do anything that needs mental alertness until you know  how this medicine affects you. Do not stand up or sit up quickly, especially if you are an older patient. This reduces the risk of dizzy or fainting spells. Alcohol may interfere with the effect of this medicine. Avoid alcoholic drinks. This medicine will cause constipation. If you do not have a bowel movement for 3 days, call your health care provider. Your mouth may get dry. Chewing sugarless gum or sucking hard candy and drinking plenty of water may help. Contact your health care provider if the problem does not go away or is severe. What side effects may I notice from receiving this medicine? Side effects that you should report to your doctor or health care professional as soon as possible:  allergic reactions (skin rash, itching or hives; swelling of the face, lips, or tongue)  confusion  kidney injury (trouble passing urine or change in the amount of urine)  low adrenal gland function (nausea; vomiting; loss of appetite; unusually weak or tired; dizziness; low blood pressure)  low blood pressure (dizziness; feeling faint or lightheaded, falls; unusually weak or tired)  serotonin syndrome (irritable; confusion; diarrhea; fast or irregular heartbeat; muscle twitching; stiff muscles; trouble walking; sweating; high fever; seizures; chills; vomiting)  trouble breathing Side effects that usually do not require medical attention (report to your doctor or health care professional if they continue or are bothersome):  constipation  dry mouth  nausea, vomiting  tiredness This list may not describe all possible side effects. Call your doctor for medical advice about side effects. You may report side effects to FDA at 1-800-FDA-1088. Where should I keep my medicine? Keep out of the reach of children and pets. This medicine can be abused. Keep it in a safe place to protect it from theft. Do not share it with anyone. It is only for you. Selling or giving away this medicine is dangerous and  against the law. Store at [employer] [address] (77 degrees F). Protect from light and moisture. Keep the container tightly closed. Get rid of any unused medicine after the expiration date. This medicine may cause harm and death if it is taken by other adults, children, or pets. It is important to get rid of the medicine as soon as you no longer need it or it is expired. You can do this in two ways:  Take the medicine to a medicine take-back program. Check with your pharmacy or law enforcement to find a location.  If you cannot return the medicine, flush it down the toilet. NOTE: This sheet is a summary. It may not cover all possible information. If you have questions about this medicine, talk to your doctor, pharmacist, or health care provider.  2021 Elsevier/Gold Standard (2019-12-13 13:26:06)   Gabapentin capsules or tablets What is this medicine? GABAPENTIN (GA ba pen tin) is used to control seizures in certain types  of epilepsy. It is also used to treat certain types of nerve pain. This medicine may be used for other purposes; ask your health care provider or pharmacist if you have questions. COMMON BRAND NAME(S): Active-PAC with Gabapentin, Orpha Bur, Gralise, Neurontin What should I tell my health care provider before I take this medicine? They need to know if you have any of these conditions:  history of drug abuse or alcohol abuse problem  kidney disease  lung or breathing disease  suicidal thoughts, plans, or attempt; a previous suicide attempt by you or a family member  an unusual or allergic reaction to gabapentin, other medicines, foods, dyes, or preservatives  pregnant or trying to get pregnant  breast-feeding How should I use this medicine? Take this medicine by mouth with a glass of water. Follow the directions on the prescription label. You can take it with or without food. If it upsets your stomach, take it with food. Take your medicine at regular intervals. Do not take it  more often than directed. Do not stop taking except on your doctor's advice. If you are directed to break the 600 or 800 mg tablets in half as part of your dose, the extra half tablet should be used for the next dose. If you have not used the extra half tablet within 28 days, it should be thrown away. A special MedGuide will be given to you by the pharmacist with each prescription and refill. Be sure to read this information carefully each time. Talk to your pediatrician regarding the use of this medicine in children. While this drug may be prescribed for children as young as 3 years for selected conditions, precautions do apply. Overdosage: If you think you have taken too much of this medicine contact a poison control center or emergency room at once. NOTE: This medicine is only for you. Do not share this medicine with others. What if I miss a dose? If you miss a dose, take it as soon as you can. If it is almost time for your next dose, take only that dose. Do not take double or extra doses. What may interact with this medicine? This medicine may interact with the following medications:  alcohol  antihistamines for allergy, cough, and cold  certain medicines for anxiety or sleep  certain medicines for depression like amitriptyline, fluoxetine, sertraline  certain medicines for seizures like phenobarbital, primidone  certain medicines for stomach problems  general anesthetics like halothane, isoflurane, methoxyflurane, propofol  local anesthetics like lidocaine, pramoxine, tetracaine  medicines that relax muscles for surgery  narcotic medicines for pain  phenothiazines like chlorpromazine, mesoridazine, prochlorperazine, thioridazine This list may not describe all possible interactions. Give your health care provider a list of all the medicines, herbs, non-prescription drugs, or dietary supplements you use. Also tell them if you smoke, drink alcohol, or use illegal drugs. Some items  may interact with your medicine. What should I watch for while using this medicine? Visit your doctor or health care provider for regular checks on your progress. You may want to keep a record at home of how you feel your condition is responding to treatment. You may want to share this information with your doctor or health care provider at each visit. You should contact your doctor or health care provider if your seizures get worse or if you have any new types of seizures. Do not stop taking this medicine or any of your seizure medicines unless instructed by your doctor or health care provider. Stopping your  medicine suddenly can increase your seizures or their severity. This medicine may cause serious skin reactions. They can happen weeks to months after starting the medicine. Contact your health care provider right away if you notice fevers or flu-like symptoms with a rash. The rash may be red or purple and then turn into blisters or peeling of the skin. Or, you might notice a red rash with swelling of the face, lips or lymph nodes in your neck or under your arms. Wear a medical identification bracelet or chain if you are taking this medicine for seizures, and carry a card that lists all your medications. You may get drowsy, dizzy, or have blurred vision. Do not drive, use machinery, or do anything that needs mental alertness until you know how this medicine affects you. To reduce dizzy or fainting spells, do not sit or stand up quickly, especially if you are an older patient. Alcohol can increase drowsiness and dizziness. Avoid alcoholic drinks. Your mouth may get dry. Chewing sugarless gum or sucking hard candy, and drinking plenty of water will help. The use of this medicine may increase the chance of suicidal thoughts or actions. Pay special attention to how you are responding while on this medicine. Any worsening of mood, or thoughts of suicide or dying should be reported to your health care provider  right away. Women who become pregnant while using this medicine may enroll in the [place] Pregnancy Registry by calling [phone]. This registry collects information about the safety of antiepileptic drug use during pregnancy. What side effects may I notice from receiving this medicine? Side effects that you should report to your doctor or health care professional as soon as possible:  allergic reactions like skin rash, itching or hives, swelling of the face, lips, or tongue  breathing problems  rash, fever, and swollen lymph nodes  redness, blistering, peeling or loosening of the skin, including inside the mouth  suicidal thoughts, mood changes Side effects that usually do not require medical attention (report to your doctor or health care professional if they continue or are bothersome):  dizziness  drowsiness  headache  nausea, vomiting  swelling of ankles, feet, hands  tiredness This list may not describe all possible side effects. Call your doctor for medical advice about side effects. You may report side effects to FDA at 1-800-FDA-1088. Where should I keep my medicine? Keep out of reach of children. This medicine may cause accidental overdose and death if it taken by other adults, children, or pets. Mix any unused medicine with a substance like cat litter or coffee grounds. Then throw the medicine away in a sealed container like a sealed bag or a coffee can with a lid. Do not use the medicine after the expiration date. Store at room temperature between 15 and 30 degrees C (59 and 86 degrees F). NOTE: This sheet is a summary. It may not cover all possible information. If you have questions about this medicine, talk to your doctor, pharmacist, or health care provider.  2021 Elsevier/Gold Standard (2018-04-24 14:16:43)  Ondansetron oral dissolving tablet What is this medicine? ONDANSETRON (on DAN se tron) is used to treat nausea and vomiting  caused by chemotherapy. It is also used to prevent or treat nausea and vomiting after surgery. This medicine may be used for other purposes; ask your health care provider or pharmacist if you have questions. COMMON BRAND NAME(S): Zofran ODT What should I tell my health care provider before I take this medicine? They  need to know if you have any of these conditions:  heart disease  history of irregular heartbeat  liver disease  low levels of magnesium or potassium in the blood  an unusual or allergic reaction to ondansetron, granisetron, other medicines, foods, dyes, or preservatives  pregnant or trying to get pregnant  breast-feeding How should I use this medicine? These tablets are made to dissolve in the mouth. Do not try to push the tablet through the foil backing. With dry hands, peel away the foil backing and gently remove the tablet. Place the tablet in the mouth and allow it to dissolve, then swallow. While you may take these tablets with water, it is not necessary to do so. Talk to your pediatrician regarding the use of this medicine in children. Special care may be needed. Overdosage: If you think you have taken too much of this medicine contact a poison control center or emergency room at once. NOTE: This medicine is only for you. Do not share this medicine with others. What if I miss a dose? If you miss a dose, take it as soon as you can. If it is almost time for your next dose, take only that dose. Do not take double or extra doses. What may interact with this medicine? Do not take this medicine with any of the following medications:  apomorphine  certain medicines for fungal infections like fluconazole, itraconazole, ketoconazole, posaconazole, voriconazole  cisapride  dronedarone  pimozide  thioridazine This medicine may also interact with the following medications:  carbamazepine  certain medicines for depression, anxiety, or psychotic  disturbances  fentanyl  linezolid  MAOIs like Carbex, Eldepryl, Marplan, Nardil, and Parnate  methylene blue (injected into a vein)  other medicines that prolong the QT interval (cause an abnormal heart rhythm) like dofetilide, ziprasidone  phenytoin  rifampicin  tramadol This list may not describe all possible interactions. Give your health care provider a list of all the medicines, herbs, non-prescription drugs, or dietary supplements you use. Also tell them if you smoke, drink alcohol, or use illegal drugs. Some items may interact with your medicine. What should I watch for while using this medicine? Check with your doctor or health care professional as soon as you can if you have any sign of an allergic reaction. What side effects may I notice from receiving this medicine? Side effects that you should report to your doctor or health care professional as soon as possible:  allergic reactions like skin rash, itching or hives, swelling of the face, lips, or tongue  breathing problems  confusion  dizziness  fast or irregular heartbeat  feeling faint or lightheaded, falls  fever and chills  loss of balance or coordination  seizures  sweating  swelling of the hands and feet  tightness in the chest  tremors  unusually weak or tired Side effects that usually do not require medical attention (report to your doctor or health care professional if they continue or are bothersome):  constipation or diarrhea  headache This list may not describe all possible side effects. Call your doctor for medical advice about side effects. You may report side effects to FDA at 1-800-FDA-1088. Where should I keep my medicine? Keep out of the reach of children. Store between 2 and 30 degrees C (36 and 86 degrees F). Throw away any unused medicine after the expiration date. NOTE: This sheet is a summary. It may not cover all possible information. If you have questions about this  medicine, talk to your  doctor, pharmacist, or health care provider.  2021 Elsevier/Gold Standard (2018-01-13 07:14:10)   Meloxicam tablets What is this medicine? MELOXICAM (mel OX i cam) is a non-steroidal anti-inflammatory drug, also known as an NSAID. It is used to treat pain, inflammation, and swelling. This medicine may be used for other purposes; ask your health care provider or pharmacist if you have questions. COMMON BRAND NAME(S): Mobic What should I tell my health care provider before I take this medicine? They need to know if you have any of these conditions:  asthma (lung or breathing disease)  bleeding disorder  coronary artery bypass graft (CABG) within the past 2 weeks  dehydration  heart attack  heart disease  heart failure  high blood pressure  if you often drink alcohol  kidney disease  liver disease  smoke tobacco cigarettes  stomach bleeding  stomach ulcers, other stomach or intestine problems  take medicines that treat or prevent blood clots  taking other steroids like dexamethasone or prednisone  an unusual or allergic reaction to meloxicam, other medicines, foods, dyes, or preservatives  pregnant or trying to get pregnant  breast-feeding How should I use this medicine? Take this medicine by mouth. Take it as directed on the prescription label at the same time every day. You can take it with or without food. If it upsets your stomach, take it with food. Do not use it more often than directed. There may be unused or extra doses in the bottle after you finish your treatment. Talk to your health care provider if you have questions about your dose. A special MedGuide will be given to you by the pharmacist with each prescription and refill. Be sure to read this information carefully each time. Talk to your health care provider about the use of this medicine in children. Special care may be needed. Patients over 78 years of age may have a stronger  reaction and need a smaller dose. Overdosage: If you think you have taken too much of this medicine contact a poison control center or emergency room at once. NOTE: This medicine is only for you. Do not share this medicine with others. What if I miss a dose? If you miss a dose, take it as soon as you can. If it is almost time for your next dose, take only that dose. Do not take double or extra doses. What may interact with this medicine? Do not take this medicine with any of the following medications:  cidofovir  ketorolac This medicine may also interact with the following medications:  aspirin and aspirin-like medicines  certain medicines for blood pressure, heart disease, irregular heart beat  certain medicines for depression, anxiety, or psychotic disturbances  certain medicines that treat or prevent blood clots like warfarin, enoxaparin, dalteparin, apixaban, dabigatran, rivaroxaban  cyclosporine  diuretics  fluconazole  lithium  methotrexate  other NSAIDs, medicines for pain and inflammation, like ibuprofen and naproxen  pemetrexed This list may not describe all possible interactions. Give your health care provider a list of all the medicines, herbs, non-prescription drugs, or dietary supplements you use. Also tell them if you smoke, drink alcohol, or use illegal drugs. Some items may interact with your medicine. What should I watch for while using this medicine? Visit your health care provider for regular checks on your progress. Tell your health care provider if your symptoms do not start to get better or if they get worse. Do not take other medicines that contain aspirin, ibuprofen, or naproxen with this medicine.  Side effects such as stomach upset, nausea, or ulcers may be more likely to occur. Many non-prescription medicines contain aspirin, ibuprofen, or naproxen. Always read labels carefully. This medicine can cause serious ulcers and bleeding in the stomach. It can  happen with no warning. Smoking, drinking alcohol, older age, and poor health can also increase risks. Call your health care provider right away if you have stomach pain or blood in your vomit or stool. This medicine does not prevent a heart attack or stroke. This medicine may increase the chance of a heart attack or stroke. The chance may increase the longer you use this medicine or if you have heart disease. If you take aspirin to prevent a heart attack or stroke, talk to your health care provider about using this medicine. Alcohol may interfere with the effect of this medicine. Avoid alcoholic drinks. This medicine may cause serious skin reactions. They can happen weeks to months after starting the medicine. Contact your health care provider right away if you notice fevers or flu-like symptoms with a rash. The rash may be red or purple and then turn into blisters or peeling of the skin. Or, you might notice a red rash with swelling of the face, lips or lymph nodes in your neck or under your arms. Talk to your health care provider if you are pregnant before taking this medicine. Taking this medicine between weeks 20 and 30 of pregnancy may harm your unborn baby. Your health care provider will monitor you closely if you need to take it. After 30 weeks of pregnancy, do not take this medicine. You may get drowsy or dizzy. Do not drive, use machinery, or do anything that needs mental alertness until you know how this medicine affects you. Do not stand up or sit up quickly, especially if you are an older patient. This reduces the risk of dizzy or fainting spells. Be careful brushing or flossing your teeth or using a toothpick because you may get an infection or bleed more easily. If you have any dental work done, tell your dentist you are receiving this medicine. This medicine may make it more difficult to get pregnant. Talk to your health care provider if you are concerned about your fertility. What side  effects may I notice from receiving this medicine? Side effects that you should report to your doctor or health care professional as soon as possible:  allergic reactions (skin rash, itching or hives; swelling of the face, lips, or tongue)  bleeding (bloody or black, tarry stools; red or dark brown urine; spitting up blood or brown material that looks like coffee grounds; red spots on the skin; unusual bruising or bleeding from the eyes, gums, or nose)  blood clot (chest pain; shortness of breath; pain, swelling, or warmth in the leg)  general ill feeling or flu-like symptoms  high potassium levels (chest pain; fast, irregular heartbeat; muscle weakness)  kidney injury (trouble passing urine or change in the amount of urine)  light-colored stool  liver injury (dark yellow or brown urine; general ill feeling or flu-like symptoms; loss of appetite, right upper belly pain; unusually weak or tired, yellowing of the eyes or skin)  low red blood cell counts (trouble breathing; feeling faint; lightheaded, falls; unusually weak or tired)  rash, fever, and swollen lymph nodes  redness, blistering, peeling, or loosening of the skin, including inside the mouth  stroke (changes in vision; confusion; trouble speaking or understanding; severe headaches; sudden numbness or weakness of the face, arm  or leg; trouble walking; dizziness; loss of balance or coordination) Side effects that usually do not require medical attention (report to your doctor or health care professional if they continue or are bothersome):  constipation  diarrhea  dizziness  gas  headache  nausea, vomiting This list may not describe all possible side effects. Call your doctor for medical advice about side effects. You may report side effects to FDA at 1-800-FDA-1088. Where should I keep my medicine? Keep out of the reach of children and pets. Store at room temperature between 20 and 25 degrees C (68 and 77 degrees F).  Protect from moisture. Keep the container tightly closed. Get rid of any unused medicine after the expiration date. To get rid of medicines that are no longer needed or have expired:  Take the medicine to a medicine take-back program. Check with your pharmacy or law enforcement to find a location.  If you cannot return the medicine, check the label or package insert to see if the medicine should be thrown out in the garbage or flushed down the toilet. If you are not sure, ask your health care provider. If it is safe to put it in the trash, empty the medicine out of the container. Mix the medicine with cat litter, dirt, coffee grounds, or other unwanted substance. Seal the mixture in a bag or container. Put it in the trash. NOTE: This sheet is a summary. It may not cover all possible information. If you have questions about this medicine, talk to your doctor, pharmacist, or health care provider.  2021 Elsevier/Gold Standard (2020-01-03 15:14:36)

## 2020-02-17 NOTE — Telephone Encounter (Signed)
The ER note from 02/08/2020 is for fracture so it was a week or so ago

## 2020-02-17 NOTE — Anesthesia Postprocedure Evaluation (Signed)
Anesthesia Post Note  Patient: Alyssa Navarro  Procedure(s) Performed: OPEN REDUCTION INTERNAL FIXATION (ORIF) ANKLE FRACTURE (Left Ankle)  Patient location during evaluation: PACU Anesthesia Type: General Level of consciousness: awake and oriented Pain management: satisfactory to patient Vital Signs Assessment: post-procedure vital signs reviewed and stable Respiratory status: respiratory function stable and spontaneous breathing Cardiovascular status: blood pressure returned to baseline and stable Postop Assessment: no apparent nausea or vomiting Anesthetic complications: no   No complications documented.   Last Vitals:  Vitals:   02/17/20 1045 02/17/20 1100  BP: 117/63 114/65  Pulse: 78 80  Resp: 20 20  Temp:    SpO2: 100% 93%    Last Pain:  Vitals:   02/17/20 1100  TempSrc:   PainSc: 0-No pain                 Karna Dupes

## 2020-02-17 NOTE — Anesthesia Procedure Notes (Addendum)
Procedure Name: LMA Insertion Date/Time: 02/17/2020 7:37 AM Performed by: Karna Dupes, CRNA Pre-anesthesia Checklist: Patient identified, Emergency Drugs available, Suction available and Patient being monitored Patient Re-evaluated:Patient Re-evaluated prior to induction Oxygen Delivery Method: Circle system utilized Preoxygenation: Pre-oxygenation with 100% oxygen Induction Type: IV induction LMA: LMA inserted LMA Size: 4.0 Tube secured with: Tape Dental Injury: Teeth and Oropharynx as per pre-operative assessment

## 2020-02-17 NOTE — Anesthesia Preprocedure Evaluation (Signed)
Anesthesia Evaluation  Patient identified by MRN, date of birth, ID band Patient awake    Reviewed: Allergy & Precautions, NPO status , Patient's Chart, lab work & pertinent test results  History of Anesthesia Complications Negative for: history of anesthetic complications  Airway Mallampati: I  TM Distance: >3 FB Neck ROM: Full    Dental  (+) Dental Advisory Given, Caps,    Pulmonary neg pulmonary ROS,           Cardiovascular Exercise Tolerance: Good hypertension, Pt. on medications + CAD and + Cardiac Stents   Rhythm:Regular Rate:Normal     Neuro/Psych negative neurological ROS  negative psych ROS   GI/Hepatic negative GI ROS, Neg liver ROS,   Endo/Other  negative endocrine ROS  Renal/GU negative Renal ROS  negative genitourinary   Musculoskeletal  (+) Arthritis ,   Abdominal   Peds negative pediatric ROS (+)  Hematology  (+) anemia ,   Anesthesia Other Findings   Reproductive/Obstetrics                            Anesthesia Physical Anesthesia Plan  ASA: III  Anesthesia Plan: General   Post-op Pain Management:  Regional for Post-op pain   Induction: Intravenous  PONV Risk Score and Plan: Ondansetron and Midazolam  Airway Management Planned: LMA  Additional Equipment:   Intra-op Plan:   Post-operative Plan: Extubation in OR  Informed Consent: I have reviewed the patients History and Physical, chart, labs and discussed the procedure including the risks, benefits and alternatives for the proposed anesthesia with the patient or authorized representative who has indicated his/her understanding and acceptance.     Dental advisory given  Plan Discussed with: CRNA and Surgeon  Anesthesia Plan Comments:         Anesthesia Quick Evaluation

## 2020-02-17 NOTE — Anesthesia Procedure Notes (Signed)
Anesthesia Regional Block: Popliteal block   Pre-Anesthetic Checklist: ,, timeout performed, Correct Patient, Correct Site, Correct Laterality, Correct Procedure, Correct Position, site marked, Risks and benefits discussed,  Surgical consent,  Pre-op evaluation,  At surgeon's request and post-op pain management  Laterality: Left  Prep: chloraprep       Needles:  Injection technique: Single-shot  Needle Type: Echogenic Stimulator Needle     Needle Length: 10cm  Needle Gauge: 20   Needle insertion depth: 6 cm   Additional Needles:   Procedures:,,,, ultrasound used (permanent image in chart),,,,  Narrative:  Start time: 02/17/2020 7:13 AM End time: 02/17/2020 7:19 AM  Performed by: Personally  Anesthesiologist: Denese Killings, MD  Additional Notes: BP cuff, EKG monitors applied. Sedation begun.  After nerve location anesthetic injected incrementally, slowly , and after neg aspirations. Bupivacaine 0.5% 14 ml plus bupivacaine 0.25% 15 ml plus dexamethasone 4 mg , total of 30 ml was injected. Tolerated well.

## 2020-02-17 NOTE — Interval H&P Note (Signed)
History and Physical Interval Note:  02/17/2020 7:10 AM  Alyssa Navarro  has presented today for surgery, with the diagnosis of Left bimalleolar ankle fracture.  The various methods of treatment have been discussed with the patient and family. After consideration of risks, benefits and other options for treatment, the patient has consented to  Procedure(s) with comments: OPEN REDUCTION INTERNAL FIXATION (ORIF) ANKLE FRACTURE (Left) - pt knows to arrive at 6:15 as a surgical intervention.  The patient's history has been reviewed, patient examined, no change in status, stable for surgery.  I have reviewed the patient's chart and labs.  Questions were answered to the patient's satisfaction.    Patient with unstable bimalleolar ankle fracture.  Temporized in a cast.  No pain.  She will benefit from ORIF of her left ankle to give her the best chance to return to her previous form and function.   [name]

## 2020-02-17 NOTE — Anesthesia Procedure Notes (Signed)
Anesthesia Regional Block: Adductor canal block   Pre-Anesthetic Checklist: ,, timeout performed, Correct Patient, Correct Site, Correct Laterality, Correct Procedure, Correct Position, site marked, Risks and benefits discussed, at surgeon's request and post-op pain management  Laterality: Left  Prep: chloraprep       Needles:  Injection technique: Single-shot  Needle Type: Stimulator Needle - 80     Needle Length: 10cm  Needle Gauge: 20   Needle insertion depth: 6 cm   Additional Needles:   Procedures:,,,, ultrasound used (permanent image in chart),,,,  Narrative:  Start time: 02/17/2020 7:20 AM End time: 02/17/2020 7:25 AM  Performed by: Personally  Anesthesiologist: Denese Killings, MD  Additional Notes: BP cuff, EKG monitors applied. Sedation begun.  After nerve location anesthetic injected incrementally, slowly , and after neg aspirations. Bupivacaine .25% with epi 7 ml plus bupivacaine .25% 7 ml plus dexamethasone 4mg , total volume of 15 ml was given. Tolerated well.

## 2020-02-18 ENCOUNTER — Encounter (HOSPITAL_COMMUNITY): Payer: Self-pay | Admitting: Orthopedic Surgery

## 2020-02-18 NOTE — Telephone Encounter (Signed)
Pt informed. She will call ortho.

## 2020-02-18 NOTE — Telephone Encounter (Signed)
I am happy to help- however I am out of the office. If she wants she can ask the ortho if he can do it or see if Legrand Como or Dr Posey Pronto would be willing to sign if she needs this soon.

## 2020-02-18 NOTE — Telephone Encounter (Signed)
Great, and if he/she can not, we will work something out to help her. Thank you

## 2020-02-21 ENCOUNTER — Other Ambulatory Visit: Payer: Self-pay | Admitting: Cardiovascular Disease

## 2020-02-22 ENCOUNTER — Encounter: Payer: Self-pay | Admitting: Nurse Practitioner

## 2020-02-22 ENCOUNTER — Ambulatory Visit (INDEPENDENT_AMBULATORY_CARE_PROVIDER_SITE_OTHER): Payer: Medicare Other | Admitting: Nurse Practitioner

## 2020-02-22 ENCOUNTER — Other Ambulatory Visit: Payer: Self-pay

## 2020-02-22 DIAGNOSIS — Z0000 Encounter for general adult medical examination without abnormal findings: Secondary | ICD-10-CM

## 2020-02-22 NOTE — Progress Notes (Signed)
Subjective:   Alyssa Navarro is a 70 y.o. female who presents for Medicare Annual (Subsequent) preventive examination.        Objective:    There were no vitals filed for this visit. There is no height or weight on file to calculate BMI.  Advanced Directives 02/17/2020 02/15/2020 02/07/2020 12/21/2014  Does Patient Have a Medical Advance Directive? No No No Yes  Type of Advance Directive - - Web designer;Living will  Copy of Healthcare Power of Attorney in Chart? - - - No - copy requested  Would patient like information on creating a medical advance directive? No - Patient declined No - Patient declined - -    Current Medications (verified) Outpatient Encounter Medications as of 02/22/2020  Medication Sig  . acetaminophen (TYLENOL) 500 MG tablet Take 2 tablets (1,000 mg total) by mouth every 8 (eight) hours for 14 days.  Marland Kitchen amLODipine (NORVASC) 10 MG tablet Take 1 tablet (10 mg total) by mouth daily.  Marland Kitchen aspirin EC 81 MG tablet Take 1 tablet (81 mg total) by mouth in the morning and at bedtime. Swallow whole.  Marland Kitchen atorvastatin (LIPITOR) 40 MG tablet Take 1 tablet (40 mg total) by mouth daily.  . Calcium Carb-Cholecalciferol (CALTRATE 600+D3 PO) Take 1 tablet by mouth daily.  . carvedilol (COREG) 6.25 MG tablet TAKE 1 TABLET(6.25 MG) BY MOUTH TWICE DAILY (Patient taking differently: Take 6.25 mg by mouth 2 (two) times daily with a meal.)  . cholecalciferol (VITAMIN D) 1000 UNITS tablet Take 1,000 Units by mouth daily.   . clobetasol ointment (TEMOVATE) 0.05 % Apply 1 application topically daily as needed for rash.  . Coenzyme Q10 (CO Q10) 100 MG CAPS Take 200 mg by mouth daily.   Marland Kitchen gabapentin (NEURONTIN) 100 MG capsule Take 1 capsule (100 mg total) by mouth 3 (three) times daily for 5 days.  . meloxicam (MOBIC) 15 MG tablet Take 1 tablet (15 mg total) by mouth daily for 14 days.  . Multiple Vitamins-Minerals (CENTRUM SILVER PO) Take 1 tablet by mouth daily.  .  ondansetron (ZOFRAN) 4 MG tablet Take 1 tablet (4 mg total) by mouth every 8 (eight) hours as needed for up to 14 days for nausea or vomiting.  Marland Kitchen oxyCODONE (ROXICODONE) 5 MG immediate release tablet Take 1 tablet (5 mg total) by mouth every 4 (four) hours as needed for up to 7 days.  Marland Kitchen REPATHA SURECLICK 140 MG/ML SOAJ ADMINISTER 1 ML UNDER THE SKIN EVERY 14 DAYS   No facility-administered encounter medications on file as of 02/22/2020.    Allergies (verified) Avelox [moxifloxacin hcl in nacl], Quinolones, Statins, Latex, Metoprolol, and Sulfa antibiotics   History: Past Medical History:  Diagnosis Date  . Allergy   . Anemia   . Arthritis   . Coronary artery disease   . Hyperlipidemia   . Hypertension   . Screening for malignant neoplasm of the cervix 06/23/2013  . Trichimoniasis 07/19/2013   Past Surgical History:  Procedure Laterality Date  . 2D Echocardiogram  12/22/2007   EF >55%, normal.  . ABDOMINAL HYSTERECTOMY    . APPENDECTOMY    . ARTHROSCOPY WITH ANTERIOR CRUCIATE LIGAMENT (ACL) REPAIR WITH ANTERIOR TIBILIAS GRAFT Right 2007  . BUNIONECTOMY Bilateral   . CARDIAC CATHETERIZATION  02/12/1991   CAD demonstrated in Proximal LAD-50% stenosis, Distal LAD-60%, Mid Circumflex-60% stenosis, Marginal Circumflex-80% stenosis, Proximal RCA-70% stenosis  . CARDIAC CATHETERIZATION  11/12/1993   Mild-moderate 3-vessel disease-50% mid LAD, 75% mid circumflex,  80% ostial portion L circumflex.  Marland Kitchen CARDIAC CATHETERIZATION  12/03/1996   95% mid circumflex stenosis stented with a 3.5x51mm Sci-Med NIR, resulting in reduciton to 0%. 75% distal circumflex stenosis stented with a 3.0x70mm NIR stent resulting in reduciton to 0%.  . CARDIAC CATHETERIZATION  08/23/2003   Continued medical therapy. Patent L circumflex stents placed in 1998.  . CHOLECYSTECTOMY    . COLONOSCOPY N/A 12/21/2014   Procedure: COLONOSCOPY;  Surgeon: Rogene Houston, MD;  Location: [facility];  Service: Endoscopy;   Laterality: N/A;  830  . Lexiscan Myoview  12/22/2007   No ECG changes, nondiagnostic EKG. Perfusion defect in inferior myocardial region consistent with diaphragmatic attenuation.  . ORIF ANKLE FRACTURE Left 02/17/2020   Procedure: OPEN REDUCTION INTERNAL FIXATION (ORIF) ANKLE FRACTURE;  Surgeon: [name], MD;  Location: [facility];  Service: Orthopedics;  Laterality: Left;  . TUBAL LIGATION     Family History  Problem Relation Age of Onset  . Diabetes Mother   . Hypertension Mother   . Hyperlipidemia Mother   . Heart failure Other   . Hypertension Other   . Diabetes Other   . Hyperlipidemia Father   . Heart disease Sister   . Cancer Maternal Aunt        breast  . Breast cancer Maternal Aunt   . Cancer Maternal Grandmother        breast  . Diabetes Maternal Grandmother   . Hypertension Paternal Grandmother   . Heart failure Paternal Grandmother   . Breast cancer Paternal Grandmother   . Fibroids Sister    Social History   Socioeconomic History  . Marital status: Divorced    Spouse name: Not on file  . Number of children: Not on file  . Years of education: Not on file  . Highest education level: Not on file  Occupational History  . Not on file  Tobacco Use  . Smoking status: Never Smoker  . Smokeless tobacco: Never Used  Vaping Use  . Vaping Use: Never used  Substance and Sexual Activity  . Alcohol use: No  . Drug use: No  . Sexual activity: Yes    Birth control/protection: Surgical  Other Topics Concern  . Not on file  Social History Narrative  . Not on file   Social Determinants of Health   Financial Resource Strain: Low Risk   . Difficulty of Paying Living Expenses: Not hard at all  Food Insecurity: No Food Insecurity  . Worried About Charity fundraiser in the Last Year: Never true  . Ran Out of Food in the Last Year: Never true  Transportation Needs: No Transportation Needs  . Lack of Transportation (Medical): No  . Lack of Transportation  (Non-Medical): No  Physical Activity: Inactive  . Days of Exercise per Week: 0 days  . Minutes of Exercise per Session: 0 min  Stress: No Stress Concern Present  . Feeling of Stress : Only a little  Social Connections: Unknown  . Frequency of Communication with Friends and Family: More than three times a week  . Frequency of Social Gatherings with Friends and Family: More than three times a week  . Attends Religious Services: More than 4 times per year  . Active Member of Clubs or Organizations: No  . Attends Archivist Meetings: Never  . Marital Status: Not on file    Tobacco Counseling Counseling given: Not Answered   Clinical Intake:  Diabetic? No          Activities of Daily Living In your present state of health, do you have any difficulty performing the following activities: 02/15/2020 08/10/2019  Hearing? N N  Vision? N N  Difficulty concentrating or making decisions? N N  Walking or climbing stairs? Y N  Dressing or bathing? N N  Doing errands, shopping? N N  Some recent data might be hidden    Patient Care Team: Perlie Mayo, NP as PCP - General (Family Medicine) Croitoru, Dani Gobble, MD as PCP - Cardiology (Cardiology)  Indicate any recent Medical Services you may have received from other than [facility] providers in the past year (date may be approximate).     Assessment:   This is a routine wellness examination for [place].  Hearing/Vision screen No exam data present  Dietary issues and exercise activities discussed:    Goals   None    Depression Screen PHQ 2/9 Scores 11/10/2019 08/10/2019 06/02/2019  PHQ - 2 Score 0 0 0    Fall Risk Fall Risk  11/10/2019 08/10/2019 06/02/2019 12/28/2018 10/04/2015  Falls in the past year? 0 0 0 0 Yes  Comment - - - [facility] Telephone Survey: data to providers prior to load [employer] Telephone Survey: data to providers prior to load  Number falls in past yr: - 0 0 - 1  Comment - - - - [facility] Telephone  Survey Actual Response = 1  Injury with Fall? - 0 0 - No  Risk for fall due to : - No Fall Risks - - -  Follow up - Falls evaluation completed Falls evaluation completed - -    FALL RISK PREVENTION PERTAINING TO THE HOME:  Any stairs in or around the home? Yes  If so, are there any without handrails? Yes  Home free of loose throw rugs in walkways, pet beds, electrical cords, etc? Yes  Adequate lighting in your home to reduce risk of falls? Yes   ASSISTIVE DEVICES UTILIZED TO PREVENT FALLS:  Life alert? No  Use of a cane, walker or w/c? No  Grab bars in the bathroom? No  Shower chair or bench in shower? No  Elevated toilet seat or a handicapped toilet? No   TIMED UP AND GO:  Was the test performed? No .     Cognitive Function:        Immunizations Immunization History  Administered Date(s) Administered  . Influenza, High Dose Seasonal PF 12/09/2017  . Influenza-Unspecified 11/05/2019  . Moderna Sars-Covid-2 Vaccination 03/13/2019, 04/13/2019, 12/21/2019  . Tdap 09/10/2012    TDAP status: Up to date  Flu Vaccine status: Up to date  Pneumococcal vaccine status: Up to date  Covid-19 vaccine status: Completed vaccines  Qualifies for Shingles Vaccine? Yes   Zostavax completed No   Shingrix Completed?: No.    Education has been provided regarding the importance of this vaccine. Patient has been advised to call insurance company to determine out of pocket expense if they have not yet received this vaccine. Advised may also receive vaccine at local pharmacy or Health Dept. Verbalized acceptance and understanding.  Screening Tests Health Maintenance  Topic Date Due  . Hepatitis C Screening  11/09/2020 (Originally 1950/06/13)  . PNA vac Low Risk Adult (1 of 2 - PCV13) 11/09/2020 (Originally 07/05/2013)  . MAMMOGRAM  08/26/2021  . TETANUS/TDAP  09/11/2022  . COLONOSCOPY (Pts 45-57yrs Insurance coverage will need to be confirmed)  12/20/2024  . INFLUENZA VACCINE  Completed   .  DEXA SCAN  Completed  . COVID-19 Vaccine  Completed    Health Maintenance  There are no preventive care reminders to display for this patient.  Colorectal cancer screening: Type of screening: Colonoscopy. Completed cologuard . Repeat every 2 years  Mammogram status: Completed normal . Repeat every year  Bone Density Screening: Complete  Lung Cancer Screening: (Low Dose CT Chest recommended if Age 7-80 years, 30 pack-year currently smoking OR have quit w/in 15years.) does not qualify.    Additional Screening:  Hepatitis C Screening: does qualify; Completed.   Vision Screening: Recommended annual ophthalmology exams for early detection of glaucoma and other disorders of the eye. Is the patient up to date with their annual eye exam?  Yes  Who is the provider or what is the name of the office in which the patient attends annual eye exams? Dr. Gershon Crane   If pt is not established with a provider, would they like to be referred to a provider to establish care? No .   Dental Screening: Recommended annual dental exams for proper oral hygiene  Community Resource Referral / Chronic Care Management: CRR required this visit?  No   CCM required this visit?  No      Plan:     I have personally reviewed and noted the following in the patient's chart:   . Medical and social history . Use of alcohol, tobacco or illicit drugs  . Current medications and supplements . Functional ability and status . Nutritional status . Physical activity . Advanced directives . List of other physicians . Hospitalizations, surgeries, and ER visits in previous 12 months . Vitals . Screenings to include cognitive, depression, and falls . Referrals and appointments  In addition, I have reviewed and discussed with patient certain preventive protocols, quality metrics, and best practice recommendations. A written personalized care plan for preventive services as well as general preventive health  recommendations were provided to patient.     Lonn Georgia, LPN   7/35/3299   Nurse Notes: AWV conducted over the phone with pt consent to televisit via audio. Pt was present in the home at the time, and provider off site. This call took approx. 20 min.

## 2020-02-22 NOTE — Patient Instructions (Addendum)
Alyssa Navarro , Thank you for taking time to come for your Medicare Wellness Visit. I appreciate your ongoing commitment to your health goals. Please review the following plan we discussed and let me know if I can assist you in the future.   Screening recommendations/referrals: Colonoscopy: Complete Mammogram: Complete  Bone Density: Complete  Recommended yearly ophthalmology/optometry visit for glaucoma screening and checkup Recommended yearly dental visit for hygiene and checkup  Vaccinations: Influenza vaccine: Complete  Pneumococcal vaccine: Complete Tdap vaccine: Complete 09/11/2022  Shingles vaccine: Eligible     Advanced directives: Yes   Conditions/risks identified: None   Next appointment: 03/14/20 @ 10:40am with Alyssa Beach, NP    Preventive Care 14 Years and Older, Female Preventive care refers to lifestyle choices and visits with your health care provider that can promote health and wellness. What does preventive care include?  A yearly physical exam. This is also called an annual well check.  Dental exams once or twice a year.  Routine eye exams. Ask your health care provider how often you should have your eyes checked.  Personal lifestyle choices, including:  Daily care of your teeth and gums.  Regular physical activity.  Eating a healthy diet.  Avoiding tobacco and drug use.  Limiting alcohol use.  Practicing safe sex.  Taking low-dose aspirin every day.  Taking vitamin and mineral supplements as recommended by your health care provider. What happens during an annual well check? The services and screenings done by your health care provider during your annual well check will depend on your age, overall health, lifestyle risk factors, and family history of disease. Counseling  Your health care provider may ask you questions about your:  Alcohol use.  Tobacco use.  Drug use.  Emotional well-being.  Home and relationship well-being.  Sexual  activity.  Eating habits.  History of falls.  Memory and ability to understand (cognition).  Work and work Statistician.  Reproductive health. Screening  You may have the following tests or measurements:  Height, weight, and BMI.  Blood pressure.  Lipid and cholesterol levels. These may be checked every 5 years, or more frequently if you are over 39 years old.  Skin check.  Lung cancer screening. You may have this screening every year starting at age 34 if you have a 30-pack-year history of smoking and currently smoke or have quit within the past 15 years.  Fecal occult blood test (FOBT) of the stool. You may have this test every year starting at age 80.  Flexible sigmoidoscopy or colonoscopy. You may have a sigmoidoscopy every 5 years or a colonoscopy every 10 years starting at age 9.  Hepatitis C blood test.  Hepatitis B blood test.  Sexually transmitted disease (STD) testing.  Diabetes screening. This is done by checking your blood sugar (glucose) after you have not eaten for a while (fasting). You may have this done every 1-3 years.  Bone density scan. This is done to screen for osteoporosis. You may have this done starting at age 54.  Mammogram. This may be done every 1-2 years. Talk to your health care provider about how often you should have regular mammograms. Talk with your health care provider about your test results, treatment options, and if necessary, the need for more tests. Vaccines  Your health care provider may recommend certain vaccines, such as:  Influenza vaccine. This is recommended every year.  Tetanus, diphtheria, and acellular pertussis (Tdap, Td) vaccine. You may need a Td booster every 10 years.  Zoster  vaccine. You may need this after age 19.  Pneumococcal 13-valent conjugate (PCV13) vaccine. One dose is recommended after age 38.  Pneumococcal polysaccharide (PPSV23) vaccine. One dose is recommended after age 98. Talk to your health care  provider about which screenings and vaccines you need and how often you need them. This information is not intended to replace advice given to you by your health care provider. Make sure you discuss any questions you have with your health care provider. Document Released: 02/17/2015 Document Revised: 10/11/2015 Document Reviewed: 11/22/2014 Elsevier Interactive Patient Education  2017 [employer] Prevention in the Home Falls can cause injuries. They can happen to people of all ages. There are many things you can do to make your home safe and to help prevent falls. What can I do on the outside of my home?  Regularly fix the edges of walkways and driveways and fix any cracks.  Remove anything that might make you trip as you walk through a door, such as a raised step or threshold.  Trim any bushes or trees on the path to your home.  Use bright outdoor lighting.  Clear any walking paths of anything that might make someone trip, such as rocks or tools.  Regularly check to see if handrails are loose or broken. Make sure that both sides of any steps have handrails.  Any raised decks and porches should have guardrails on the edges.  Have any leaves, snow, or ice cleared regularly.  Use sand or salt on walking paths during winter.  Clean up any spills in your garage right away. This includes oil or grease spills. What can I do in the bathroom?  Use night lights.  Install grab bars by the toilet and in the tub and shower. Do not use towel bars as grab bars.  Use non-skid mats or decals in the tub or shower.  If you need to sit down in the shower, use a plastic, non-slip stool.  Keep the floor dry. Clean up any water that spills on the floor as soon as it happens.  Remove soap buildup in the tub or shower regularly.  Attach bath mats securely with double-sided non-slip rug tape.  Do not have throw rugs and other things on the floor that can make you trip. What can I do in  the bedroom?  Use night lights.  Make sure that you have a light by your bed that is easy to reach.  Do not use any sheets or blankets that are too big for your bed. They should not hang down onto the floor.  Have a firm chair that has side arms. You can use this for support while you get dressed.  Do not have throw rugs and other things on the floor that can make you trip. What can I do in the kitchen?  Clean up any spills right away.  Avoid walking on wet floors.  Keep items that you use a lot in easy-to-reach places.  If you need to reach something above you, use a strong step stool that has a grab bar.  Keep electrical cords out of the way.  Do not use floor polish or wax that makes floors slippery. If you must use wax, use non-skid floor wax.  Do not have throw rugs and other things on the floor that can make you trip. What can I do with my stairs?  Do not leave any items on the stairs.  Make sure that there are handrails  on both sides of the stairs and use them. Fix handrails that are broken or loose. Make sure that handrails are as long as the stairways.  Check any carpeting to make sure that it is firmly attached to the stairs. Fix any carpet that is loose or worn.  Avoid having throw rugs at the top or bottom of the stairs. If you do have throw rugs, attach them to the floor with carpet tape.  Make sure that you have a light switch at the top of the stairs and the bottom of the stairs. If you do not have them, ask someone to add them for you. What else can I do to help prevent falls?  Wear shoes that:  Do not have high heels.  Have rubber bottoms.  Are comfortable and fit you well.  Are closed at the toe. Do not wear sandals.  If you use a stepladder:  Make sure that it is fully opened. Do not climb a closed stepladder.  Make sure that both sides of the stepladder are locked into place.  Ask someone to hold it for you, if possible.  Clearly mark and  make sure that you can see:  Any grab bars or handrails.  First and last steps.  Where the edge of each step is.  Use tools that help you move around (mobility aids) if they are needed. These include:  Canes.  Walkers.  Scooters.  Crutches.  Turn on the lights when you go into a dark area. Replace any light bulbs as soon as they burn out.  Set up your furniture so you have a clear path. Avoid moving your furniture around.  If any of your floors are uneven, fix them.  If there are any pets around you, be aware of where they are.  Review your medicines with your doctor. Some medicines can make you feel dizzy. This can increase your chance of falling. Ask your doctor what other things that you can do to help prevent falls. This information is not intended to replace advice given to you by your health care provider. Make sure you discuss any questions you have with your health care provider. Document Released: 11/17/2008 Document Revised: 06/29/2015 Document Reviewed: 02/25/2014 Elsevier Interactive Patient Education  2017 [employer].

## 2020-03-01 ENCOUNTER — Encounter: Payer: Self-pay | Admitting: Orthopedic Surgery

## 2020-03-01 ENCOUNTER — Ambulatory Visit (INDEPENDENT_AMBULATORY_CARE_PROVIDER_SITE_OTHER): Payer: Medicare Other | Admitting: Orthopedic Surgery

## 2020-03-01 ENCOUNTER — Other Ambulatory Visit: Payer: Self-pay

## 2020-03-01 ENCOUNTER — Ambulatory Visit: Payer: Medicare Other

## 2020-03-01 DIAGNOSIS — S82842D Displaced bimalleolar fracture of left lower leg, subsequent encounter for closed fracture with routine healing: Secondary | ICD-10-CM | POA: Diagnosis not present

## 2020-03-01 NOTE — Progress Notes (Signed)
Orthopaedic Postop Note  Assessment: Alyssa Navarro is a 70 y.o. female s/p ORIF of Left ankle fracture  DOS: 02/17/2020  Plan: Sutures removed, steri strips placed Well padded short leg cast placed in clinic today NWB on the operative extremity Continue to take Aspirin 81 mg BID  Cast application - left short leg cast   Verbal consent was obtained and the correct extremity was identified. A well padded, appropriately molded short leg cast was applied to the left leg Toes remained warm and well perfused.   There were no sharp edges Patient tolerated the procedure well Cast care instructions were provided     Follow-up: Return in about 3 weeks (around 03/22/2020). XR at next visit: Left ankle  Subjective:  Chief Complaint  Patient presents with  . Foot Injury    DOS 02/11/20.     History of Present Illness: Alyssa Navarro is a 70 y.o. female who presents following the above stated procedure.  She is doing well overall.  No longer taking pain medication.  Noted some sharp pains a few days after surgery, but this has improved.  No fevers or chills.   Review of Systems: No fevers or chills No numbness or tingling No Chest Pain No shortness of breath   Objective: There were no vitals taken for this visit.  Physical Exam:  Left ankle with minimal swelling.  Incisions are healing well.  No surrounding erythema or drainage.  Tolerates gentle range of motion.  2+ DP pulse.     IMAGING: I personally ordered and reviewed the following images   XR of the left ankle demonstrates hardware in good position without interval displacement.  No hardware failure or subsidence.   Impression: Healing left ankle fracture s/p ORIF   [name], MD 03/01/2020 11:22 AM

## 2020-03-01 NOTE — Patient Instructions (Signed)
General Cast Instructions  1.  You were placed in a cast in clinic today.  Please keep the cast material clean, dry and intact.  Please do not use anything to itch the under the cast.  If it gets itchy, you can consider taking benadryl, or similar medication.  If the cast material gets wet, place it on a towel and use a hair dryer on a low setting. 2.  Tylenol or Ibuprofen/Naproxen as needed.   3.  Recommend elevating your extremity as much as possible to help with swelling. 4.  F/u 3 weeks, cast off and repeat XR  

## 2020-03-08 ENCOUNTER — Other Ambulatory Visit: Payer: Self-pay

## 2020-03-08 ENCOUNTER — Telehealth (INDEPENDENT_AMBULATORY_CARE_PROVIDER_SITE_OTHER): Payer: Medicare Other | Admitting: Family Medicine

## 2020-03-08 ENCOUNTER — Encounter: Payer: Self-pay | Admitting: Family Medicine

## 2020-03-08 VITALS — BP 124/78 | HR 82 | Ht 61.0 in | Wt 134.0 lb

## 2020-03-08 DIAGNOSIS — I2510 Atherosclerotic heart disease of native coronary artery without angina pectoris: Secondary | ICD-10-CM | POA: Diagnosis not present

## 2020-03-08 DIAGNOSIS — I10 Essential (primary) hypertension: Secondary | ICD-10-CM

## 2020-03-08 DIAGNOSIS — M8580 Other specified disorders of bone density and structure, unspecified site: Secondary | ICD-10-CM

## 2020-03-08 DIAGNOSIS — Z1231 Encounter for screening mammogram for malignant neoplasm of breast: Secondary | ICD-10-CM

## 2020-03-08 DIAGNOSIS — E782 Mixed hyperlipidemia: Secondary | ICD-10-CM | POA: Diagnosis not present

## 2020-03-08 MED ORDER — ALENDRONATE SODIUM 70 MG PO TABS: 70.0000 mg | ORAL_TABLET | ORAL | 3 refills | Status: DC

## 2020-03-08 NOTE — Patient Instructions (Signed)
F/U in 4.5 months, call if you need me before  Please get asting labs this week  Please schedule mammogram for patient at [facility] for bone building is ONCE WEEKLY fosamax.  Increase calcium intake to 1200 mg total daily, you may get the additional 600 mg from skimmed milk and greek yogurt and calcium fortified foods  Hope that your ankle will continue to heal well.  Be careful not to fall  Think about what you will eat, plan ahead. Choose " clean, green, fresh or frozen" over canned, processed or packaged foods which are more sugary, salty and fatty. 70 to 75% of food eaten should be vegetables and fruit. Three meals at set times with snacks allowed between meals, but they must be fruit or vegetables. Aim to eat over a 12 hour period , example 7 am to 7 pm, and STOP after  your last meal of the day. Drink water,generally about 64 ounces per day, no other drink is as healthy. Fruit juice is best enjoyed in a healthy way, by EATING the fruit.   Thanks for choosing [facility], we consider it a privelige to serve you.

## 2020-03-09 ENCOUNTER — Encounter: Payer: Self-pay | Admitting: Family Medicine

## 2020-03-09 NOTE — Assessment & Plan Note (Signed)
Hyperlipidemia:Low fat diet discussed and encouraged.   Lipid Panel  Lab Results  Component Value Date   CHOL 345 (H) 11/05/2019   HDL 59 11/05/2019   LDLCALC 261 (H) 11/05/2019   TRIG 136 11/05/2019   CHOLHDL 5.8 (H) 11/05/2019  uncontrolled Updated lab needed .

## 2020-03-09 NOTE — Assessment & Plan Note (Signed)
Asymptomatic currently, f/U with cardiology anticipated in 08/2020 per note

## 2020-03-09 NOTE — Progress Notes (Signed)
Virtual Visit via Telephone Note  I connected with Alyssa Navarro on 03/09/20 at  9:40 AM EST by telephone and verified that I am speaking with the correct person using two identifiers.  Location: Patient: home Provider: work   I discussed the limitations, risks, security and privacy concerns of performing an evaluation and management service by telephone and the availability of in person appointments. I also discussed with the patient that there may be a patient responsible charge related to this service. The patient expressed understanding and agreed to proceed.   History of Present Illness: F/U chronic problems and address any new or current concerns. Review and update medications and allergies. Review recent lab and radiologic data . Update routine health maintainace. Review an encourage improved health habits to include nutrition, exercise and  sleep .  Doing well. Recently fractured her ankle, 02/2020,  when she slipped on ice, healing well, otherwise no new concerns , no complaints Denies recent fever or chills. Denies sinus pressure, nasal congestion, ear pain or sore throat. Denies chest congestion, productive cough or wheezing. Denies chest pains, palpitations and leg swelling Denies abdominal pain, nausea, vomiting,diarrhea or constipation.   Denies dysuria, frequency, hesitancy or incontinence.  Denies headaches, seizures, numbness, or tingling. Denies depression, anxiety or insomnia. Denies skin break down or rash.       Observations/Objective: BP 124/78   Pulse 82   Ht 5\' 1"  (1.549 m)   Wt 134 lb (60.8 kg)   BMI 25.32 kg/m  Good communication with no confusion and intact memory. Alert and oriented x 3 No signs of respiratory distress during speech    Assessment and Plan: HTN (hypertension) Controlled, no change in medication DASH diet and commitment to daily physical activity for a minimum of 30 minutes discussed and encouraged, as a part of  hypertension management. The importance of attaining a healthy weight is also discussed.  BP/Weight 03/08/2020 02/17/2020 02/15/2020 02/11/2020 02/07/2020 11/10/2019 3/53/2992  Systolic BP 426 834 196 222 979 892 119  Diastolic BP 78 67 85 85 75 80 87  Wt. (Lbs) 134 - 132 132 132 138.04 141  BMI 25.32 - 24.94 24.94 24.94 26.08 26.64       Mixed hyperlipidemia Hyperlipidemia:Low fat diet discussed and encouraged.   Lipid Panel  Lab Results  Component Value Date   CHOL 345 (H) 11/05/2019   HDL 59 11/05/2019   LDLCALC 261 (H) 11/05/2019   TRIG 136 11/05/2019   CHOLHDL 5.8 (H) 11/05/2019  uncontrolled Updated lab needed .      Osteopenia Start fosamax weekly , increase calcium to 1200 mg daily, vit D3 , 800 to 1000 IU daily  CAD (coronary artery disease)  Asymptomatic currently, f/U with cardiology anticipated in 08/2020 per note    Follow Up Instructions:    I discussed the assessment and treatment plan with the patient. The patient was provided an opportunity to ask questions and all were answered. The patient agreed with the plan and demonstrated an understanding of the instructions.   The patient was advised to call back or seek an in-person evaluation if the symptoms worsen or if the condition fails to improve as anticipated.  I provided 18 minutes of non-face-to-face time during this encounter.   Tula Nakayama, MD

## 2020-03-09 NOTE — Assessment & Plan Note (Signed)
Start fosamax weekly , increase calcium to 1200 mg daily, vit D3 , 800 to 1000 IU daily

## 2020-03-09 NOTE — Assessment & Plan Note (Signed)
Controlled, no change in medication DASH diet and commitment to daily physical activity for a minimum of 30 minutes discussed and encouraged, as a part of hypertension management. The importance of attaining a healthy weight is also discussed.  BP/Weight 03/08/2020 02/17/2020 02/15/2020 02/11/2020 02/07/2020 11/10/2019 1/61/0960  Systolic BP 454 098 119 147 829 562 130  Diastolic BP 78 67 85 85 75 80 87  Wt. (Lbs) 134 - 132 132 132 138.04 141  BMI 25.32 - 24.94 24.94 24.94 26.08 26.64

## 2020-03-14 ENCOUNTER — Ambulatory Visit: Payer: Medicare Other | Admitting: Family Medicine

## 2020-03-22 ENCOUNTER — Encounter: Payer: Self-pay | Admitting: Orthopedic Surgery

## 2020-03-22 ENCOUNTER — Ambulatory Visit (INDEPENDENT_AMBULATORY_CARE_PROVIDER_SITE_OTHER): Payer: Medicare Other | Admitting: Orthopedic Surgery

## 2020-03-22 ENCOUNTER — Other Ambulatory Visit: Payer: Self-pay

## 2020-03-22 ENCOUNTER — Ambulatory Visit: Payer: Medicare Other

## 2020-03-22 VITALS — BP 136/82 | HR 93 | Ht 61.0 in

## 2020-03-22 DIAGNOSIS — S82842D Displaced bimalleolar fracture of left lower leg, subsequent encounter for closed fracture with routine healing: Secondary | ICD-10-CM

## 2020-03-22 NOTE — Progress Notes (Signed)
Orthopaedic Postop Note  Assessment: Alyssa Navarro is a 70 y.o. female s/p ORIF of Left ankle fracture  DOS: 02/17/2020  Plan: Cast removed, XR reviewed.  Evidence of healing Transition to walking boot Referral to PT; they will help initiate weight bearing Ok to remove the boot and start working on ROM before WB with PT Can transition to a regular shoe when ready Provided documentation for temporary handicapped placard.    Follow-up: Return in about 6 weeks (around 05/03/2020). XR at next visit: Left ankle  Subjective:  Chief Complaint  Patient presents with  . Ankle Pain    Left ankle,     History of Present Illness: Alyssa Navarro is a 70 y.o. female who presents following the above stated procedure.  She continues to do well.  Tolerated the cast well.  Occasional pains medially.  No pain medications required.  She has continued to remain NWB.    Review of Systems: No fevers or chills No numbness or tingling No Chest Pain No shortness of breath   Objective: BP 136/82   Pulse 93   Ht 5\' 1"  (1.549 m)   BMI 25.32 kg/m   Physical Exam:  Alert and oriented, no acute distress  Left ankle with some residual swelling Surgical incisions healing well.  No surrounding erythema or drainage Tolerates gentle ROM; active dorsflexion, plantar flexion, inversion and eversion. Sensation intact over the dorsum of the foot.  Toes are warm and well perfused.   IMAGING: I personally ordered and reviewed the following images   X-rays of the left ankle demonstrates interval consolidation at the fracture sites.  Hardware remains in good position without interval subsidence.  There is no hardware failure.  Impression: Healing left ankle fracture following operative fixation.   [name], MD 03/22/2020 12:41 PM

## 2020-03-29 ENCOUNTER — Telehealth: Payer: Self-pay | Admitting: Orthopedic Surgery

## 2020-03-29 DIAGNOSIS — S82842D Displaced bimalleolar fracture of left lower leg, subsequent encounter for closed fracture with routine healing: Secondary | ICD-10-CM

## 2020-03-29 NOTE — Telephone Encounter (Signed)
I called patient and she is aware the orders are placed for physical therapy.

## 2020-03-29 NOTE — Telephone Encounter (Signed)
Patient says she has not heard from PT.    Can you check to see if someone has sent in the PT order for her?  Thanks

## 2020-03-29 NOTE — Telephone Encounter (Signed)
I have placed the orders. Physical Therapy will call this patient.

## 2020-04-10 ENCOUNTER — Other Ambulatory Visit: Payer: Self-pay

## 2020-04-10 ENCOUNTER — Ambulatory Visit (HOSPITAL_COMMUNITY): Payer: Medicare Other | Attending: Orthopedic Surgery

## 2020-04-10 ENCOUNTER — Encounter (HOSPITAL_COMMUNITY): Payer: Self-pay

## 2020-04-10 DIAGNOSIS — M25672 Stiffness of left ankle, not elsewhere classified: Secondary | ICD-10-CM | POA: Diagnosis not present

## 2020-04-10 DIAGNOSIS — R262 Difficulty in walking, not elsewhere classified: Secondary | ICD-10-CM | POA: Insufficient documentation

## 2020-04-10 NOTE — Therapy (Signed)
[facility] [place], [place], [address] Phone: [phone]   Fax:  [phone]  Physical Therapy Evaluation  Patient Details  Name: Alyssa Navarro MRN: [id number] Date of Birth: [birth year] Referring Provider (PT): Arther Abbott, MD   Encounter Date: 04/10/2020   PT End of Session - 04/10/20 1416    Visit Number 1    Number of Visits 4    Date for PT Re-Evaluation 05/08/20    Authorization Type Medicare    PT Start Time 1347    PT Stop Time 1425    PT Time Calculation (min) 38 min           Past Medical History:  Diagnosis Date  . Allergy   . Anemia   . Arthritis   . Coronary artery disease   . Hyperlipidemia   . Hypertension   . Screening for malignant neoplasm of the cervix 06/23/2013  . Trichimoniasis 07/19/2013    Past Surgical History:  Procedure Laterality Date  . 2D Echocardiogram  12/22/2007   EF >55%, normal.  . ABDOMINAL HYSTERECTOMY    . ANKLE FRACTURE SURGERY Left 02/17/2020  . APPENDECTOMY    . ARTHROSCOPY WITH ANTERIOR CRUCIATE LIGAMENT (ACL) REPAIR WITH ANTERIOR TIBILIAS GRAFT Right 2007  . BUNIONECTOMY Bilateral   . CARDIAC CATHETERIZATION  02/12/1991   CAD demonstrated in Proximal LAD-50% stenosis, Distal LAD-60%, Mid Circumflex-60% stenosis, Marginal Circumflex-80% stenosis, Proximal RCA-70% stenosis  . CARDIAC CATHETERIZATION  11/12/1993   Mild-moderate 3-vessel disease-50% mid LAD, 75% mid circumflex, 80% ostial portion L circumflex.  Marland Kitchen CARDIAC CATHETERIZATION  12/03/1996   95% mid circumflex stenosis stented with a 3.5x54mm Sci-Med NIR, resulting in reduciton to 0%. 75% distal circumflex stenosis stented with a 3.0x31mm NIR stent resulting in reduciton to 0%.  . CARDIAC CATHETERIZATION  08/23/2003   Continued medical therapy. Patent L circumflex stents placed in 1998.  . CHOLECYSTECTOMY    . COLONOSCOPY N/A 12/21/2014   Procedure: COLONOSCOPY;  Surgeon: Rogene Houston, MD;  Location: [facility]  ENDO SUITE;  Service: Endoscopy;  Laterality: N/A;  830  . Lexiscan Myoview  12/22/2007   No ECG changes, nondiagnostic EKG. Perfusion defect in inferior myocardial region consistent with diaphragmatic attenuation.  . ORIF ANKLE FRACTURE Left 02/17/2020   Procedure: OPEN REDUCTION INTERNAL FIXATION (ORIF) ANKLE FRACTURE;  Surgeon: [name], MD;  Location: [facility] ORS;  Service: Orthopedics;  Laterality: Left;  . TUBAL LIGATION      There were no vitals filed for this visit.    Subjective Assessment - 04/10/20 1358    Subjective Pt stepped on snow bank and lost her footing and left ankle popped.  Fracture found in ER later that day and pt underwent ORIF for bimalleolar fx 02/17/20.  Pt now able to initiate WBing and walking with ROM as tolerated.  Use of CAM boot for walking and WBing. Prior to injury and surgery pt was indepeendent in all aspects    Currently in Pain? No/denies              [facility] PT Assessment - 04/10/20 0001      Assessment   Medical Diagnosis left bimalleolar fx    Referring Provider (PT) Arther Abbott, MD    Onset Date/Surgical Date 02/17/20      Restrictions   Weight Bearing Restrictions Yes    LLE Weight Bearing Weight bearing as tolerated    Other Position/Activity Restrictions with CAM boot      Balance Screen  Has the patient fallen in the past 6 months Yes    How many times? 1    Has the patient had a decrease in activity level because of a fear of falling?  No    Is the patient reluctant to leave their home because of a fear of falling?  No      Home Social worker Private residence    Living Arrangements Children    Available Help at Discharge Family    Type of [place]      Prior Function   Level of [place] Retired    Leisure gardening      Cognition   Overall Cognitive Status Within Functional Limits for tasks assessed      Observation/Other Assessments   Focus on Therapeutic  Outcomes (FOTO)  37% function      ROM / Strength   AROM / PROM / Strength AROM;PROM;Strength      AROM   AROM Assessment Site Ankle      PROM   PROM Assessment Site Ankle    Right/Left Ankle Left    Left Ankle Dorsiflexion 15    Left Ankle Plantar Flexion 27    Left Ankle Inversion 15    Left Ankle Eversion 10      Strength   Strength Assessment Site Ankle    Right/Left Ankle Left      Ambulation/Gait   Ambulation/Gait Yes    Ambulation/Gait Assistance 6: Modified independent (Device/Increase time)    Ambulation Distance (Feet) 110 Feet    Assistive device Rolling walker    Gait Pattern Step-to pattern    Ambulation Surface Level    Gait velocity decreased    Gait Comments 2MWT                      Objective measurements completed on examination: See above findings.       [facility] Adult PT Treatment/Exercise - 04/10/20 0001      Exercises   Exercises Ankle      Ankle Exercises: Stretches   Gastroc Stretch 3 reps;60 seconds      Ankle Exercises: Seated   ABC's 1 rep    Ankle Circles/Pumps AROM;Left;10 reps    Towel Crunch 1 rep    Heel Slides Left;5 reps                  PT Education - 04/10/20 1528    Education Details pt education in assessment, ROM mechanics, and HEP initaition    Person(s) Educated Patient    Methods Explanation    Comprehension Verbalized understanding;Returned demonstration            PT Short Term Goals - 04/10/20 1534      PT SHORT TERM GOAL #1   Title Patient will report at least 25% improvement in symptoms for improved quality of life.    Time 2    Period Weeks    Status New    Target Date 04/24/20      PT SHORT TERM GOAL #2   Title Patient will demo left ankle dorsiflexion to 25 degrees to facilitate normalized gait kinematics    Baseline 15 degrees    Time 2    Period Weeks    Status New    Target Date 04/24/20      PT SHORT TERM GOAL #3   Title Patient will be independent with HEP in order  to improve  functional outcomes.    Time 2    Period Weeks    Status New    Target Date 04/24/20             PT Long Term Goals - 04/10/20 1535      PT LONG TERM GOAL #1   Title Patient will improve on FOTO score to meet predicted outcomes to improve functional independence    Baseline 37% function    Time 4    Period Weeks    Status New    Target Date 05/08/20      PT LONG TERM GOAL #2   Title Demo improved gait velocity as evidenced by distance of 200 ft during 2MWT    Baseline 110 ft with RW    Time 4    Period Weeks    Status New    Target Date 05/08/20                  Plan - 04/10/20 1529    Clinical Impression Statement Patient is 70 yo lady who presents to PT clinic s/p left ankle ORIF and now permissible to intiate WBing and ambulation with use of CAM boot.  Pt would benefit from skilled PT services to improve left ankle ROM/strength to prepare for gait without CAM boot and facilitate return to normal ambulation and reduce risk for falls    Personal Factors and Comorbidities Time since onset of injury/illness/exacerbation    Examination-Activity Limitations Carry;Lift;Stand;Stairs;Squat;Locomotion Level    Examination-Participation Restrictions Yard Work;Volunteer;Driving;Cleaning;Meal Prep    Stability/Clinical Decision Making Stable/Uncomplicated    Clinical Decision Making Low    Rehab Potential Excellent    PT Frequency 1x / week    PT Duration 4 weeks    PT Treatment/Interventions Aquatic Therapy;ADLs/Self Care Home Management;Cryotherapy;Ultrasound;Gait training;Stair training;Functional mobility training;Therapeutic activities;Therapeutic exercise;Balance training;Patient/family education;Manual techniques;Passive range of motion;Taping;Dry needling;Energy conservation;Spinal Manipulations;Joint Manipulations    PT Next Visit Plan Continue with ankle ROM/strengthening exercises    PT Home Exercise Plan ankle ABC's, gastroc stretch, DF mobilization            Patient will benefit from skilled therapeutic intervention in order to improve the following deficits and impairments:  Abnormal gait,Decreased activity tolerance,Decreased balance,Decreased mobility,Decreased knowledge of use of DME,Decreased endurance,Decreased range of motion,Decreased strength,Difficulty walking,Impaired perceived functional ability,Improper body mechanics  Visit Diagnosis: Difficulty in walking, not elsewhere classified  Stiffness of left ankle, not elsewhere classified     Problem List Patient Active Problem List   Diagnosis Date Noted  . Osteopenia 06/02/2019  . CAD (coronary artery disease)  10/26/2012  . Mixed hyperlipidemia 10/26/2012  . HTN (hypertension) 10/26/2012   3:38 PM, 04/10/20 M. Sherlyn Lees, PT, DPT Physical Therapist- [place] Office Number: [phone]  [facility] [address] [place], [place], [address] Phone: [phone]   Fax:  [phone]  Name: Alyssa Navarro MRN: [id number] Date of Birth: 09-Mar-1950

## 2020-04-18 ENCOUNTER — Encounter (HOSPITAL_COMMUNITY): Payer: Self-pay

## 2020-04-18 ENCOUNTER — Ambulatory Visit (HOSPITAL_COMMUNITY): Payer: Medicare Other

## 2020-04-18 ENCOUNTER — Other Ambulatory Visit: Payer: Self-pay

## 2020-04-18 DIAGNOSIS — R262 Difficulty in walking, not elsewhere classified: Secondary | ICD-10-CM | POA: Diagnosis not present

## 2020-04-18 DIAGNOSIS — M25672 Stiffness of left ankle, not elsewhere classified: Secondary | ICD-10-CM

## 2020-04-18 NOTE — Therapy (Signed)
[place], [place], [address] Phone: [phone]   Fax:  [phone]  Physical Therapy Treatment  Patient Details  Name: Alyssa Navarro MRN: [id number] Date of Birth: [birth year] Referring Provider (PT): Arther Abbott, MD   Encounter Date: 04/18/2020   PT End of Session - 04/18/20 1447    Visit Number 2    Number of Visits 4    Date for PT Re-Evaluation 05/08/20    Authorization Type Medicare    PT Start Time 0347    PT Stop Time 1525    PT Time Calculation (min) 40 min    Activity Tolerance Patient tolerated treatment well    Behavior During Therapy [facility] for tasks assessed/performed           Past Medical History:  Diagnosis Date  . Allergy   . Anemia   . Arthritis   . Coronary artery disease   . Hyperlipidemia   . Hypertension   . Screening for malignant neoplasm of the cervix 06/23/2013  . Trichimoniasis 07/19/2013    Past Surgical History:  Procedure Laterality Date  . 2D Echocardiogram  12/22/2007   EF >55%, normal.  . ABDOMINAL HYSTERECTOMY    . ANKLE FRACTURE SURGERY Left 02/17/2020  . APPENDECTOMY    . ARTHROSCOPY WITH ANTERIOR CRUCIATE LIGAMENT (ACL) REPAIR WITH ANTERIOR TIBILIAS GRAFT Right 2007  . BUNIONECTOMY Bilateral   . CARDIAC CATHETERIZATION  02/12/1991   CAD demonstrated in Proximal LAD-50% stenosis, Distal LAD-60%, Mid Circumflex-60% stenosis, Marginal Circumflex-80% stenosis, Proximal RCA-70% stenosis  . CARDIAC CATHETERIZATION  11/12/1993   Mild-moderate 3-vessel disease-50% mid LAD, 75% mid circumflex, 80% ostial portion L circumflex.  Marland Kitchen CARDIAC CATHETERIZATION  12/03/1996   95% mid circumflex stenosis stented with a 3.5x17mm Sci-Med NIR, resulting in reduciton to 0%. 75% distal circumflex stenosis stented with a 3.0x73mm NIR stent resulting in reduciton to 0%.  . CARDIAC CATHETERIZATION  08/23/2003   Continued medical therapy. Patent L circumflex stents placed in 1998.  .  CHOLECYSTECTOMY    . COLONOSCOPY N/A 12/21/2014   Procedure: COLONOSCOPY;  Surgeon: Rogene Houston, MD;  [facility];  Service: Endoscopy;  Laterality: N/A;  830  . Lexiscan Myoview  12/22/2007   No ECG changes, nondiagnostic EKG. Perfusion defect in inferior myocardial region consistent with diaphragmatic attenuation.  . ORIF ANKLE FRACTURE Left 02/17/2020   Procedure: OPEN REDUCTION INTERNAL FIXATION (ORIF) ANKLE FRACTURE;  Surgeon: [name], MD;  Location: [facility];  Service: Orthopedics;  Laterality: Left;  . TUBAL LIGATION      There were no vitals filed for this visit.   Subjective Assessment - 04/18/20 1446    Subjective Pt arrived in CAM boot, reports she has not done a lot of WB-ing.  Reports compliance with HEP, some difficulty with alphabet exercise.    Currently in Pain? No/denies              [facility] PT Assessment - 04/18/20 0001      Assessment   Medical Diagnosis left bimalleolar fx    Referring Provider (PT) Arther Abbott, MD    Onset Date/Surgical Date 02/17/20    Next MD Visit 05/03/20      Restrictions   Weight Bearing Restrictions Yes    LLE Weight Bearing Weight bearing as tolerated    Other Position/Activity Restrictions with CAM boot  [place] Adult PT Treatment/Exercise - 04/18/20 0001      Exercises   Exercises Ankle      Ankle Exercises: Stretches   Gastroc Stretch 3 reps;30 seconds    Gastroc Stretch Limitations long sitting with towel      Ankle Exercises: Seated   ABC's 1 rep    Towel Crunch 2 reps   3'   [place]ble Pickup 10x    Heel Raises 10 reps    Toe Raise 10 reps    BAPS Sitting;Level 2;10 reps                  PT Education - 04/18/20 1508    Education Details Reviewed goals, reviewed current HEP compliance wiht min cueing to increase ease with ABCs.  Discussed edema control with RICE techniques and encouraged to complete ankle pumps during elevation.  May benefit from  compression socks to address edema    Person(s) Educated Patient    Methods Explanation;Demonstration;Verbal cues    Comprehension Verbalized understanding;Returned demonstration            PT Short Term Goals - 04/10/20 1534      PT SHORT TERM GOAL #1   Title Patient will report at least 25% improvement in symptoms for improved quality of life.    Time 2    Period Weeks    Status New    Target Date 04/24/20      PT SHORT TERM GOAL #2   Title Patient will demo left ankle dorsiflexion to 25 degrees to facilitate normalized gait kinematics    Baseline 15 degrees    Time 2    Period Weeks    Status New    Target Date 04/24/20      PT SHORT TERM GOAL #3   Title Patient will be independent with HEP in order to improve functional outcomes.    Time 2    Period Weeks    Status New    Target Date 04/24/20             PT Long Term Goals - 04/10/20 1535      PT LONG TERM GOAL #1   Title Patient will improve on FOTO score to meet predicted outcomes to improve functional independence    Baseline 37% function    Time 4    Period Weeks    Status New    Target Date 05/08/20      PT LONG TERM GOAL #2   Title Demo improved gait velocity as evidenced by distance of 200 ft during 2MWT    Baseline 110 ft with RW    Time 4    Period Weeks    Status New    Target Date 05/08/20                 Plan - 04/18/20 1513    Clinical Impression Statement Reviewed goals, educated importance of HEP compliance for maximal benefits.  Pt able to recall all exercises with min cueing to improve ankle vs foot mechanics with ABC exercises.  Therex focus on ankle mobility as well as ankle and intrinsic musculature strengthening.  Pt able to demonstrate appropriate mechanics with min cueing for knee compensation wiht BAPS board.  No reoprts of pain through session.  Added heel and toe raises to HEP.    Personal Factors and Comorbidities Time since onset of injury/illness/exacerbation     Examination-Activity Limitations Carry;Lift;Stand;Stairs;Squat;Locomotion Level    Examination-Participation Restrictions Yard Work;Volunteer;Driving;Cleaning;Meal Prep  Stability/Clinical Decision Making Stable/Uncomplicated    Clinical Decision Making Low    Rehab Potential Excellent    PT Frequency 1x / week    PT Duration 4 weeks    PT Treatment/Interventions Aquatic Therapy;ADLs/Self Care Home Management;Cryotherapy;Ultrasound;Gait training;Stair training;Functional mobility training;Therapeutic activities;Therapeutic exercise;Balance training;Patient/family education;Manual techniques;Passive range of motion;Taping;Dry needling;Energy conservation;Spinal Manipulations;Joint Manipulations    PT Next Visit Plan Continue with ankle ROM/strengthening exercises.  Add HEP weekly as pt comes 1x/week.  Add static balance (tandem, etc) with CAM boot next session.  May benefit from compression socks, assess next session.    PT Home Exercise Plan ankle ABC's, gastroc stretch, DF mobilization; 3/15: marble pick up, heel and toe raises           Patient will benefit from skilled therapeutic intervention in order to improve the following deficits and impairments:  Abnormal gait,Decreased activity tolerance,Decreased balance,Decreased mobility,Decreased knowledge of use of DME,Decreased endurance,Decreased range of motion,Decreased strength,Difficulty walking,Impaired perceived functional ability,Improper body mechanics  Visit Diagnosis: Difficulty in walking, not elsewhere classified  Stiffness of left ankle, not elsewhere classified     Problem List Patient Active Problem List   Diagnosis Date Noted  . Osteopenia 06/02/2019  . CAD (coronary artery disease)  10/26/2012  . Mixed hyperlipidemia 10/26/2012  . HTN (hypertension) 10/26/2012   Ihor Winner, LPTA/CLT; CBIS [phone]  Aldona Lento 04/18/2020, 4:08 PM  [place] [address] [place], [place], [address] Phone: [phone]   Fax:  [phone]  Name: Alyssa Navarro MRN: [id number] Date of Birth: 1950-02-11

## 2020-04-25 ENCOUNTER — Ambulatory Visit (HOSPITAL_COMMUNITY): Payer: Medicare Other

## 2020-04-25 ENCOUNTER — Encounter (HOSPITAL_COMMUNITY): Payer: Self-pay

## 2020-04-25 ENCOUNTER — Other Ambulatory Visit: Payer: Self-pay

## 2020-04-25 DIAGNOSIS — R262 Difficulty in walking, not elsewhere classified: Secondary | ICD-10-CM | POA: Diagnosis not present

## 2020-04-25 DIAGNOSIS — M25672 Stiffness of left ankle, not elsewhere classified: Secondary | ICD-10-CM | POA: Diagnosis not present

## 2020-04-25 NOTE — Therapy (Signed)
[place], [place], [address] Phone: [phone]   Fax:  [phone]  Physical Therapy Treatment  Patient Details  Name: Alyssa Navarro MRN: [id number] Date of Birth: October 07, 1950 Referring Provider (PT): Arther Abbott, MD   Encounter Date: 04/25/2020   PT End of Session - 04/25/20 0908    Visit Number 3    Number of Visits 4    Date for PT Re-Evaluation 05/08/20    Authorization Type Medicare    PT Start Time 0903    PT Stop Time 0947    PT Time Calculation (min) 44 min    Activity Tolerance Patient tolerated treatment well    Behavior During Therapy [facility] for tasks assessed/performed           Past Medical History:  Diagnosis Date  . Allergy   . Anemia   . Arthritis   . Coronary artery disease   . Hyperlipidemia   . Hypertension   . Screening for malignant neoplasm of the cervix 06/23/2013  . Trichimoniasis 07/19/2013    Past Surgical History:  Procedure Laterality Date  . 2D Echocardiogram  12/22/2007   EF >55%, normal.  . ABDOMINAL HYSTERECTOMY    . ANKLE FRACTURE SURGERY Left 02/17/2020  . APPENDECTOMY    . ARTHROSCOPY WITH ANTERIOR CRUCIATE LIGAMENT (ACL) REPAIR WITH ANTERIOR TIBILIAS GRAFT Right 2007  . BUNIONECTOMY Bilateral   . CARDIAC CATHETERIZATION  02/12/1991   CAD demonstrated in Proximal LAD-50% stenosis, Distal LAD-60%, Mid Circumflex-60% stenosis, Marginal Circumflex-80% stenosis, Proximal RCA-70% stenosis  . CARDIAC CATHETERIZATION  11/12/1993   Mild-moderate 3-vessel disease-50% mid LAD, 75% mid circumflex, 80% ostial portion L circumflex.  Marland Kitchen CARDIAC CATHETERIZATION  12/03/1996   95% mid circumflex stenosis stented with a 3.5x76mm Sci-Med NIR, resulting in reduciton to 0%. 75% distal circumflex stenosis stented with a 3.0x24mm NIR stent resulting in reduciton to 0%.  . CARDIAC CATHETERIZATION  08/23/2003   Continued medical therapy. Patent L circumflex stents placed in 1998.  .  CHOLECYSTECTOMY    . COLONOSCOPY N/A 12/21/2014   Procedure: COLONOSCOPY;  Surgeon: Rogene Houston, MD;  [facility];  Service: Endoscopy;  Laterality: N/A;  830  . Lexiscan Myoview  12/22/2007   No ECG changes, nondiagnostic EKG. Perfusion defect in inferior myocardial region consistent with diaphragmatic attenuation.  . ORIF ANKLE FRACTURE Left 02/17/2020   Procedure: OPEN REDUCTION INTERNAL FIXATION (ORIF) ANKLE FRACTURE;  Surgeon: [name], MD;  Location: [facility];  Service: Orthopedics;  Laterality: Left;  . TUBAL LIGATION      There were no vitals filed for this visit.   Subjective Assessment - 04/25/20 0911    Subjective Pt reports feeling better with some soreness in her ankle from HEP but overall feeling well and completing HEP 1-2 times/day    Currently in Pain? No/denies              [facility] PT Assessment - 04/25/20 0001      Assessment   Medical Diagnosis left bimalleolar fx    Referring Provider (PT) Arther Abbott, MD    Onset Date/Surgical Date 02/17/20    Next MD Visit 05/03/20      Restrictions   Weight Bearing Restrictions Yes    LLE Weight Bearing Weight bearing as tolerated    Other Position/Activity Restrictions with CAM boot  [place] Adult PT Treatment/Exercise - 04/25/20 0001      Manual Therapy   Manual Therapy Joint mobilization    Manual therapy comments comleted separate from all other interventions    Joint Mobilization mobilization to increase left ankle DF and great toe extension with posterior glide to talocrural with accompanying stretch into DF      Ankle Exercises: Seated   Toe Raise --   3x10 dorsiflexion   BAPS Sitting;Level 2;10 reps    Heel Slides Left   3x10 with hands on knee for resistance   Other Seated Ankle Exercises manually resisted ankle PF, DF, inv, eversion 1x10,12,15,18 reps      Ankle Exercises: Stretches   Gastroc Stretch 3 reps;30 seconds    Gastroc Stretch  Limitations long sitting with towel                  PT Education - 04/25/20 0946    Education Details education in HEP performance and scaling exercise to prepare for activities out of boot once permitted    Person(s) Educated Patient    Methods Explanation    Comprehension Verbalized understanding            PT Short Term Goals - 04/10/20 1534      PT SHORT TERM GOAL #1   Title Patient will report at least 25% improvement in symptoms for improved quality of life.    Time 2    Period Weeks    Status New    Target Date 04/24/20      PT SHORT TERM GOAL #2   Title Patient will demo left ankle dorsiflexion to 25 degrees to facilitate normalized gait kinematics    Baseline 15 degrees    Time 2    Period Weeks    Status New    Target Date 04/24/20      PT SHORT TERM GOAL #3   Title Patient will be independent with HEP in order to improve functional outcomes.    Time 2    Period Weeks    Status New    Target Date 04/24/20             PT Long Term Goals - 04/10/20 1535      PT LONG TERM GOAL #1   Title Patient will improve on FOTO score to meet predicted outcomes to improve functional independence    Baseline 37% function    Time 4    Period Weeks    Status New    Target Date 05/08/20      PT LONG TERM GOAL #2   Title Demo improved gait velocity as evidenced by distance of 200 ft during 2MWT    Baseline 110 ft with RW    Time 4    Period Weeks    Status New    Target Date 05/08/20                 Plan - 04/25/20 0919    Clinical Impression Statement Tolerating tx sessions well and demonstrates some edema in left foot.  ROM assessment reveals plantarflexion WFL.  Limited ankle dorsiflexion.  Continued POC indicated to improve ankle mobility, strength, and progress for out of boot activities once permitted by MD    Personal Factors and Comorbidities Time since onset of injury/illness/exacerbation    Examination-Activity Limitations  Carry;Lift;Stand;Stairs;Squat;Locomotion Level    Examination-Participation Restrictions Yard Work;Volunteer;Driving;Cleaning;Meal Prep    Stability/Clinical Decision Making Stable/Uncomplicated    Rehab Potential Excellent  PT Frequency 1x / week    PT Duration 4 weeks    PT Treatment/Interventions Aquatic Therapy;ADLs/Self Care Home Management;Cryotherapy;Ultrasound;Gait training;Stair training;Functional mobility training;Therapeutic activities;Therapeutic exercise;Balance training;Patient/family education;Manual techniques;Passive range of motion;Taping;Dry needling;Energy conservation;Spinal Manipulations;Joint Manipulations    PT Next Visit Plan Continue with ankle ROM/strengthening exercises.  Add HEP weekly as pt comes 1x/week.  Add static balance (tandem, etc) with CAM boot next session.  May benefit from compression socks, assess next session.    PT Home Exercise Plan ankle ABC's, gastroc stretch, DF mobilization; 3/15: marble pick up, heel and toe raises           Patient will benefit from skilled therapeutic intervention in order to improve the following deficits and impairments:  Abnormal gait,Decreased activity tolerance,Decreased balance,Decreased mobility,Decreased knowledge of use of DME,Decreased endurance,Decreased range of motion,Decreased strength,Difficulty walking,Impaired perceived functional ability,Improper body mechanics  Visit Diagnosis: Difficulty in walking, not elsewhere classified  Stiffness of left ankle, not elsewhere classified     Problem List Patient Active Problem List   Diagnosis Date Noted  . Osteopenia 06/02/2019  . CAD (coronary artery disease)  10/26/2012  . Mixed hyperlipidemia 10/26/2012  . HTN (hypertension) 10/26/2012   9:50 AM, 04/25/20 M. Sherlyn Lees, PT, DPT Physical Therapist- [place] Office Number: [phone]  [facility] [address] [place], [place], [address] Phone:  [phone]   Fax:  [phone]  Name: Alyssa Navarro MRN: [id number] Date of Birth: [birth year]

## 2020-05-02 ENCOUNTER — Encounter (HOSPITAL_COMMUNITY): Payer: Self-pay

## 2020-05-02 ENCOUNTER — Ambulatory Visit (HOSPITAL_COMMUNITY): Payer: Medicare Other

## 2020-05-02 ENCOUNTER — Other Ambulatory Visit: Payer: Self-pay

## 2020-05-02 DIAGNOSIS — M25672 Stiffness of left ankle, not elsewhere classified: Secondary | ICD-10-CM

## 2020-05-02 DIAGNOSIS — R262 Difficulty in walking, not elsewhere classified: Secondary | ICD-10-CM | POA: Diagnosis not present

## 2020-05-02 NOTE — Therapy (Signed)
[facility] [address] [place], [place], [address] Phone: [phone]   Fax:  [phone]  Physical Therapy Treatment  Patient Details  Name: Alyssa Navarro MRN: [id number] Date of Birth: 07-26-1950 Referring Provider (PT): Arther Abbott, MD  PHYSICAL THERAPY DISCHARGE SUMMARY  Visits from Start of Care: 4  Current functional level related to goals / functional outcomes: See below for details  Remaining deficits: Remains WBAT with fracture boot but demonstrates improved functional status as evidenced by FOTO score improvement   Education / Equipment: Provided with updated HEP to progress once MD authorizes/permits WBAT without fracture boot Plan: Patient agrees to discharge.  Patient goals were partially met. Patient is being discharged due to meeting the stated rehab goals.  ?????       Encounter Date: 05/02/2020   PT End of Session - 05/02/20 0907    Visit Number 4    Number of Visits 4    Date for PT Re-Evaluation 05/08/20    Authorization Type Medicare    PT Start Time 0902    PT Stop Time 0945    PT Time Calculation (min) 43 min    Activity Tolerance Patient tolerated treatment well    Behavior During Therapy [facility] for tasks assessed/performed           Past Medical History:  Diagnosis Date  . Allergy   . Anemia   . Arthritis   . Coronary artery disease   . Hyperlipidemia   . Hypertension   . Screening for malignant neoplasm of the cervix 06/23/2013  . Trichimoniasis 07/19/2013    Past Surgical History:  Procedure Laterality Date  . 2D Echocardiogram  12/22/2007   EF >55%, normal.  . ABDOMINAL HYSTERECTOMY    . ANKLE FRACTURE SURGERY Left 02/17/2020  . APPENDECTOMY    . ARTHROSCOPY WITH ANTERIOR CRUCIATE LIGAMENT (ACL) REPAIR WITH ANTERIOR TIBILIAS GRAFT Right 2007  . BUNIONECTOMY Bilateral   . CARDIAC CATHETERIZATION  02/12/1991   CAD demonstrated in Proximal LAD-50% stenosis, Distal LAD-60%, Mid  Circumflex-60% stenosis, Marginal Circumflex-80% stenosis, Proximal RCA-70% stenosis  . CARDIAC CATHETERIZATION  11/12/1993   Mild-moderate 3-vessel disease-50% mid LAD, 75% mid circumflex, 80% ostial portion L circumflex.  Marland Kitchen CARDIAC CATHETERIZATION  12/03/1996   95% mid circumflex stenosis stented with a 3.5x12m Sci-Med NIR, resulting in reduciton to 0%. 75% distal circumflex stenosis stented with a 3.0x120mNIR stent resulting in reduciton to 0%.  . CARDIAC CATHETERIZATION  08/23/2003   Continued medical therapy. Patent L circumflex stents placed in 1998.  . CHOLECYSTECTOMY    . COLONOSCOPY N/A 12/21/2014   Procedure: COLONOSCOPY;  Surgeon: NaRogene HoustonMD;  [facility];  Service: Endoscopy;  Laterality: N/A;  830  . Lexiscan Myoview  12/22/2007   No ECG changes, nondiagnostic EKG. Perfusion defect in inferior myocardial region consistent with diaphragmatic attenuation.  . ORIF ANKLE FRACTURE Left 02/17/2020   Procedure: OPEN REDUCTION INTERNAL FIXATION (ORIF) ANKLE FRACTURE;  Surgeon: CaMordecai RasmussenMD;  Location: [facility];  Service: Orthopedics;  Laterality: Left;  . TUBAL LIGATION      There were no vitals filed for this visit.   Subjective Assessment - 05/02/20 0906    Subjective Pt reports compliance with HEP and she has MD appointment tomorrow    Currently in Pain? No/denies              [facility]      Assessment   Medical  Diagnosis left bimalleolar fx    Referring Provider (PT) Arther Abbott, MD    Onset Date/Surgical Date 02/17/20    Next MD Visit 05/03/20      Restrictions   Weight Bearing Restrictions Yes    LLE Weight Bearing Weight bearing as tolerated    Other Position/Activity Restrictions with CAM boot      Observation/Other Assessments   Focus on Therapeutic Outcomes (FOTO)  68% function      PROM   Left Ankle Dorsiflexion 15    Left Ankle Plantar Flexion 29    Left Ankle Inversion 15    Left Ankle Eversion 10       Strength   Left Ankle Dorsiflexion 5/5    Left Ankle Plantar Flexion 5/5   via MMT, not WBing position                        [facility] Adult PT Treatment/Exercise - 05/02/20 0001      Manual Therapy   Manual Therapy Joint mobilization    Manual therapy comments comleted separate from all other interventions    Joint Mobilization mobilization to increase left ankle DF and great toe extension with posterior glide to talocrural with accompanying stretch into DF      Ankle Exercises: Seated   ABC's 1 rep    Heel Raises Left;20 reps    Toe Raise 20 reps    BAPS Sitting;Level 3    Other Seated Ankle Exercises manually resisted ankle PF, DF, inv, eversion 1x10,12,15,18 reps    Other Seated Ankle Exercises manually resisted with knee flexion and extension to isolate      Ankle Exercises: Stretches   Gastroc Stretch 3 reps;30 seconds                  PT Education - 05/02/20 0938    Education Details education in exercise progression and provided with updated HEP to be completed once WBing without fracture boot    Person(s) Educated Patient    Methods Explanation    Comprehension Verbalized understanding            PT Short Term Goals - 05/02/20 0950      PT SHORT TERM GOAL #1   Title Patient will report at least 25% improvement in symptoms for improved quality of life.    Baseline 25%    Time 2    Period Weeks    Status Achieved    Target Date 04/24/20      PT SHORT TERM GOAL #2   Title Patient will demo left ankle dorsiflexion to 25 degrees to facilitate normalized gait kinematics    Baseline 15 degrees    Time 2    Period Weeks    Status Not Met    Target Date 04/24/20      PT SHORT TERM GOAL #3   Title Patient will be independent with HEP in order to improve functional outcomes.    Time 2    Period Weeks    Status Achieved    Target Date 04/24/20             PT Long Term Goals - 05/02/20 0951      PT LONG TERM GOAL #1   Title  Patient will improve on FOTO score to meet predicted outcomes to improve functional independence    Baseline 37% function; 68% function 05/02/20    Time 4    Period Weeks    Status  Achieved      PT LONG TERM GOAL #2   Title Demo improved gait velocity as evidenced by distance of 200 ft during 2MWT    Baseline 200 ft with cane and fracture boot    Time 4    Period Weeks    Status Achieved                 Plan - 05/02/20 0939    Clinical Impression Statement Pt has tolerated tx sessions well without adverse effects and demonstrates slight limitation in dorsiflexion ROM but has regained strength per MMT without pain or deficits. Pt will D/C to HEP for this time and will return to see ortho MD regarding further WBing allowance    Personal Factors and Comorbidities Time since onset of injury/illness/exacerbation    Examination-Activity Limitations Carry;Lift;Stand;Stairs;Squat;Locomotion Level    Examination-Participation Restrictions Yard Work;Volunteer;Driving;Cleaning;Meal Prep    Stability/Clinical Decision Making Stable/Uncomplicated    Rehab Potential Excellent    PT Frequency 1x / week    PT Duration 4 weeks    PT Treatment/Interventions Aquatic Therapy;ADLs/Self Care Home Management;Cryotherapy;Ultrasound;Gait training;Stair training;Functional mobility training;Therapeutic activities;Therapeutic exercise;Balance training;Patient/family education;Manual techniques;Passive range of motion;Taping;Dry needling;Energy conservation;Spinal Manipulations;Joint Manipulations    PT Next Visit Plan Continue with ankle ROM/strengthening exercises.  Add HEP weekly as pt comes 1x/week.  Add static balance (tandem, etc) with CAM boot next session.  May benefit from compression socks, assess next session.    PT Home Exercise Plan ankle ABC's, gastroc stretch, DF mobilization; 3/15: marble pick up, heel and toe raises           Patient will benefit from skilled therapeutic intervention in  order to improve the following deficits and impairments:  Abnormal gait,Decreased activity tolerance,Decreased balance,Decreased mobility,Decreased knowledge of use of DME,Decreased endurance,Decreased range of motion,Decreased strength,Difficulty walking,Impaired perceived functional ability,Improper body mechanics  Visit Diagnosis: Difficulty in walking, not elsewhere classified  Stiffness of left ankle, not elsewhere classified     Problem List Patient Active Problem List   Diagnosis Date Noted  . Osteopenia 06/02/2019  . CAD (coronary artery disease)  10/26/2012  . Mixed hyperlipidemia 10/26/2012  . HTN (hypertension) 10/26/2012   9:54 AM, 05/02/20 M. Sherlyn Lees, PT, DPT Physical Therapist- [place] Office Number: [phone]  [facility] [address] [place], [place], [address] Phone: [phone]   Fax:  [phone]  Name: Alyssa Navarro MRN: [id number] Date of Birth: [birth year]

## 2020-05-03 ENCOUNTER — Encounter: Payer: Self-pay | Admitting: Orthopedic Surgery

## 2020-05-03 ENCOUNTER — Ambulatory Visit (INDEPENDENT_AMBULATORY_CARE_PROVIDER_SITE_OTHER): Payer: Medicare Other | Admitting: Orthopedic Surgery

## 2020-05-03 ENCOUNTER — Ambulatory Visit: Payer: Medicare Other

## 2020-05-03 ENCOUNTER — Ambulatory Visit: Payer: Medicare Other | Admitting: Orthopedic Surgery

## 2020-05-03 VITALS — BP 135/71 | HR 78 | Ht 61.0 in

## 2020-05-03 DIAGNOSIS — S82842D Displaced bimalleolar fracture of left lower leg, subsequent encounter for closed fracture with routine healing: Secondary | ICD-10-CM | POA: Diagnosis not present

## 2020-05-03 NOTE — Progress Notes (Signed)
Orthopaedic Postop Note  Assessment: Alyssa Navarro is a 70 y.o. female s/p ORIF of Left ankle fracture  DOS: 02/17/2020  Plan: Healing well Radiographs demonstrate good healing Ok to transition out of the CAM boot. Walking is excellent activity Anticipate full use of regular shoe within 4 weeks. Follow up 6 weeks for final evaluation, call with issues.    Follow-up: Return in about 6 weeks (around 06/14/2020). XR at next visit: Left ankle  Subjective:  Chief Complaint  Patient presents with  . Foot Injury    History of Present Illness: Alyssa Navarro is a 70 y.o. female who presents following the above stated procedure.  She has done very well with PT and has been discharged.  She is now working on her exercises at home on her own.  She continues to use the boot and a cane.  No issues with her incisions.  Occasional pains in her foot have improved.  Occasional numbness over forefoot.     Review of Systems: No fevers or chills No numbness or tingling No Chest Pain No shortness of breath   Objective: BP 135/71   Pulse 78   Ht 5\' 1"  (1.549 m)   BMI 25.32 kg/m   Physical Exam:  Alert and oriented, no acute distress  Left is not swollen.  Surgical incisions are healed, without surrounding erythema or drainage.  5/5 strength plantar flexion, dorsiflexion, inversion and eversion Sensation intact over the dorsum of the foot.  Toes are warm and well perfused.  10 degrees of dorsiflexion with knee flexed.   IMAGING: I personally ordered and reviewed the following images   X-rays of the left ankle demonstrates excellent alignment of the ankle.  There has been no interval displacement at the fracture site.  Hardware is intact without failure or subsidence.  No acute injuries.   Impression: Healed left ankle fracture following operative fixation.   [name], MD 05/03/2020 10:14 AM

## 2020-05-08 ENCOUNTER — Other Ambulatory Visit: Payer: Self-pay | Admitting: Family Medicine

## 2020-05-08 DIAGNOSIS — E782 Mixed hyperlipidemia: Secondary | ICD-10-CM

## 2020-05-09 ENCOUNTER — Encounter (HOSPITAL_COMMUNITY): Payer: Medicare Other

## 2020-06-14 ENCOUNTER — Other Ambulatory Visit: Payer: Self-pay

## 2020-06-14 ENCOUNTER — Encounter: Payer: Self-pay | Admitting: Orthopedic Surgery

## 2020-06-14 ENCOUNTER — Ambulatory Visit: Payer: Medicare Other

## 2020-06-14 ENCOUNTER — Ambulatory Visit (INDEPENDENT_AMBULATORY_CARE_PROVIDER_SITE_OTHER): Payer: Medicare Other | Admitting: Orthopedic Surgery

## 2020-06-14 VITALS — BP 132/73 | HR 72 | Ht 61.0 in | Wt 130.0 lb

## 2020-06-14 DIAGNOSIS — S82842D Displaced bimalleolar fracture of left lower leg, subsequent encounter for closed fracture with routine healing: Secondary | ICD-10-CM | POA: Diagnosis not present

## 2020-06-14 NOTE — Progress Notes (Signed)
Orthopaedic Postop Note  Assessment: Alyssa Navarro is a 70 y.o. female s/p ORIF of Left ankle fracture  DOS: 02/17/2020  Plan: Healing well Radiographs stable WBAT, no restrictions Anticipate continued improvement F/u around 1 year postop   Follow-up: Return in about 8 months (around 02/16/2021). XR at next visit: Left ankle  Subjective:  Chief Complaint  Patient presents with  . Routine Post Op     Lt ankle ORIF DOS 02/17/20    History of Present Illness: Alyssa Navarro is a 70 y.o. female who presents following the above stated procedure.  She is now 5 months postop.    She is doing well.  She continues to improve.  She is walking on a daily basis.  No pain.  She notes occasional swelling.   Review of Systems: No fevers or chills No numbness or tingling No Chest Pain No shortness of breath   Objective: BP 132/73   Pulse 72   Ht 5\' 1"  (1.549 m)   Wt 130 lb (59 kg)   BMI 24.56 kg/m   Physical Exam:  Alert and oriented, no acute distress  Ambulates with a cane, but walks well without the cane also Surgical incisions are well healed.  No surrounding erythema or drainage  No swelling ROM comparable to contralateral ankle Sensation intact to light touch throughout the foot.    IMAGING: I personally ordered and reviewed the following images   XR of the left ankle were obtained in clinic today and demonstrates excellent alignment of the bimalleolar ankle fracture.  No hardware failure or subsidence.  No interval displacement.  No acute injuries.  Impression: Healed left ankle fracture following operative fixation.   [name], MD 06/14/2020 10:28 AM

## 2020-07-06 ENCOUNTER — Other Ambulatory Visit: Payer: Self-pay

## 2020-07-06 ENCOUNTER — Ambulatory Visit (INDEPENDENT_AMBULATORY_CARE_PROVIDER_SITE_OTHER): Payer: Medicare Other | Admitting: Family Medicine

## 2020-07-06 ENCOUNTER — Encounter: Payer: Self-pay | Admitting: Family Medicine

## 2020-07-06 VITALS — BP 144/90 | HR 84 | Resp 16 | Ht 61.0 in | Wt 133.8 lb

## 2020-07-06 DIAGNOSIS — R0989 Other specified symptoms and signs involving the circulatory and respiratory systems: Secondary | ICD-10-CM | POA: Diagnosis not present

## 2020-07-06 DIAGNOSIS — E559 Vitamin D deficiency, unspecified: Secondary | ICD-10-CM | POA: Diagnosis not present

## 2020-07-06 DIAGNOSIS — I10 Essential (primary) hypertension: Secondary | ICD-10-CM

## 2020-07-06 DIAGNOSIS — E782 Mixed hyperlipidemia: Secondary | ICD-10-CM

## 2020-07-06 DIAGNOSIS — M8580 Other specified disorders of bone density and structure, unspecified site: Secondary | ICD-10-CM

## 2020-07-06 DIAGNOSIS — R5383 Other fatigue: Secondary | ICD-10-CM

## 2020-07-06 DIAGNOSIS — I2510 Atherosclerotic heart disease of native coronary artery without angina pectoris: Secondary | ICD-10-CM

## 2020-07-06 NOTE — Progress Notes (Signed)
   Alyssa Navarro     MRN: [id number]      DOB: 05/06/1950   HPI Alyssa Navarro is here for follow up and re-evaluation of chronic medical conditions, medication management and review of any available recent lab and radiology data.  Preventive health is updated, specifically  Cancer screening and Immunization.   Questions or concerns regarding consultations or procedures which the PT has had in the interim are  addressed. The PT denies any adverse reactions to current medications since the last visit.  Notes that she feels tired , has had anemia in the pat and is unsure if this is the issue. Also she is recovering from a left leg  fracture sustained in January and  Is only recently mobile once more, hnce there is deconditioning. She does have CAD with stent placement, no new chest discomfort, PND, orthopnea or leg swelling ROS Denies recent fever or chills. Denies sinus pressure, nasal congestion, ear pain or sore throat. Denies chest congestion, productive cough or wheezing.  Denies abdominal pain, nausea, vomiting,diarrhea or constipation.   Denies dysuria, frequency, hesitancy or incontinence. Denies headaches, seizures, numbness, or tingling. Denies depression, anxiety or insomnia. Denies skin break down or rash.   PE  BP (!) 144/90   Pulse 84   Resp 16   Ht 5\' 1"  (1.549 m)   Wt 133 lb 12.8 oz (60.7 kg)   SpO2 94%   BMI 25.28 kg/m    Patient alert and oriented and in no cardiopulmonary distress.  HEENT: No facial asymmetry, EOMI,     Neck supple .Carotid bruit  Chest: Clear to auscultation bilaterally.  CVS: S1, S2 no murmurs, no S3.Regular rate.  ABD: Soft non tender.   Ext: No edema  MS: Adequate though reduced  ROM spine, shoulders, hips and knees.  Skin: Intact, no ulcerations or rash noted.  Psych: Good eye contact, normal affect. Memory intact not anxious or depressed appearing.  CNS: CN 2-12 intact, power,  normal throughout.no focal deficits  noted.   Assessment & Plan  HTN (hypertension) Elevated at visit, record review shows generally controlled, no med change at this time DASH diet and commitment to daily physical activity for a minimum of 30 minutes discussed and encouraged, as a part of hypertension management. The importance of attaining a healthy weight is also discussed.  BP/Weight 07/06/2020 06/14/2020 05/03/2020 03/22/2020 03/08/2020 02/17/2020 05/13/8117  Systolic BP 147 829 562 130 865 784 696  Diastolic BP 90 73 71 82 78 67 85  Wt. (Lbs) 133.8 130 - - 134 - 132  BMI 25.28 24.56 25.32 25.32 25.32 - 24.94       Mixed hyperlipidemia Controlled, no change in medication Hyperlipidemia:Low fat diet discussed and encouraged.   Lipid Panel  Lab Results  Component Value Date   CHOL 139 07/06/2020   HDL 67 07/06/2020   LDLCALC 53 07/06/2020   TRIG 108 07/06/2020   CHOLHDL 2.1 07/06/2020       Osteopenia contrinue fosamax and calcium with D  CAD (coronary artery disease)  New exertional fatigue. EKG: NSR, no acute ischemia, non specific t wave abnormality, no change from 2021. Cardiology for annual review, referral entered

## 2020-07-06 NOTE — Patient Instructions (Addendum)
Annual physical exam 10/07 or after, call if you need [place] sooner please   Labs today, CBC, lipid, cmp and eGFr and vit D,  AND TSH, please enter new order  I am referring you to cardiology  EKG today due to fatigue   Please get shingrix vaccines when able  Covid booster #2 currently due  Please check your pneumonia vaccine status, our records do not indicate that you have had this    Important that you follow through and get your colonoscopy , it is past due  Thanks for choosing [facility], we consider it a privelige to serve you.

## 2020-07-07 ENCOUNTER — Telehealth: Payer: Self-pay

## 2020-07-07 LAB — CMP14+EGFR
ALT: 23 IU/L (ref 0–32)
AST: 24 IU/L (ref 0–40)
Albumin/Globulin Ratio: 1.7 (ref 1.2–2.2)
Albumin: 4.7 g/dL (ref 3.7–4.7)
Alkaline Phosphatase: 105 IU/L (ref 44–121)
BUN/Creatinine Ratio: 24 (ref 12–28)
BUN: 13 mg/dL (ref 8–27)
Bilirubin Total: 0.7 mg/dL (ref 0.0–1.2)
CO2: 26 mmol/L (ref 20–29)
Calcium: 10.2 mg/dL (ref 8.7–10.3)
Chloride: 104 mmol/L (ref 96–106)
Creatinine, Ser: 0.55 mg/dL — ABNORMAL LOW (ref 0.57–1.00)
Globulin, Total: 2.8 g/dL (ref 1.5–4.5)
Glucose: 91 mg/dL (ref 65–99)
Potassium: 3.9 mmol/L (ref 3.5–5.2)
Sodium: 145 mmol/L — ABNORMAL HIGH (ref 134–144)
Total Protein: 7.5 g/dL (ref 6.0–8.5)
eGFR: 97 mL/min/{1.73_m2} (ref >59)

## 2020-07-07 LAB — LIPID PANEL
Chol/HDL Ratio: 2.1 ratio (ref 0.0–4.4)
Cholesterol, Total: 139 mg/dL (ref 100–199)
HDL: 67 mg/dL (ref >39)
LDL Chol Calc (NIH): 53 mg/dL (ref 0–99)
Triglycerides: 108 mg/dL (ref 0–149)
VLDL Cholesterol Cal: 19 mg/dL (ref 5–40)

## 2020-07-07 LAB — CBC
Hematocrit: 41.4 % (ref 34.0–46.6)
Hemoglobin: 13.2 g/dL (ref 11.1–15.9)
MCH: 28.5 pg (ref 26.6–33.0)
MCHC: 31.9 g/dL (ref 31.5–35.7)
MCV: 89 fL (ref 79–97)
Platelets: 227 10*3/uL (ref 150–450)
RBC: 4.63 x10E6/uL (ref 3.77–5.28)
RDW: 14.2 % (ref 11.7–15.4)
WBC: 8.4 10*3/uL (ref 3.4–10.8)

## 2020-07-07 LAB — VITAMIN D 25 HYDROXY (VIT D DEFICIENCY, FRACTURES): Vit D, 25-Hydroxy: 98.8 ng/mL (ref 30.0–100.0)

## 2020-07-07 LAB — TSH: TSH: 0.784 u[IU]/mL (ref 0.450–4.500)

## 2020-07-07 NOTE — Telephone Encounter (Signed)
Patient called left voicemail returning call about labs.

## 2020-07-09 ENCOUNTER — Encounter: Payer: Self-pay | Admitting: Family Medicine

## 2020-07-09 NOTE — Assessment & Plan Note (Signed)
contrinue fosamax and calcium with D

## 2020-07-09 NOTE — Assessment & Plan Note (Signed)
Elevated at visit, record review shows generally controlled, no med change at this time DASH diet and commitment to daily physical activity for a minimum of 30 minutes discussed and encouraged, as a part of hypertension management. The importance of attaining a healthy weight is also discussed.  BP/Weight 07/06/2020 06/14/2020 05/03/2020 03/22/2020 03/08/2020 02/17/2020 5/74/7340  Systolic BP 370 964 383 818 403 754 360  Diastolic BP 90 73 71 82 78 67 85  Wt. (Lbs) 133.8 130 - - 134 - 132  BMI 25.28 24.56 25.32 25.32 25.32 - 24.94

## 2020-07-09 NOTE — Assessment & Plan Note (Signed)
Controlled, no change in medication Hyperlipidemia:Low fat diet discussed and encouraged.   Lipid Panel  Lab Results  Component Value Date   CHOL 139 07/06/2020   HDL 67 07/06/2020   LDLCALC 53 07/06/2020   TRIG 108 07/06/2020   CHOLHDL 2.1 07/06/2020

## 2020-07-09 NOTE — Assessment & Plan Note (Signed)
New exertional fatigue. EKG: NSR, no acute ischemia, non specific t wave abnormality, no change from 2021. Cardiology for annual review, referral entered

## 2020-07-31 ENCOUNTER — Other Ambulatory Visit: Payer: Self-pay | Admitting: *Deleted

## 2020-07-31 DIAGNOSIS — E782 Mixed hyperlipidemia: Secondary | ICD-10-CM

## 2020-07-31 MED ORDER — ATORVASTATIN CALCIUM 40 MG PO TABS: ORAL_TABLET | ORAL | 2 refills | Status: DC

## 2020-08-05 DIAGNOSIS — Z23 Encounter for immunization: Secondary | ICD-10-CM | POA: Diagnosis not present

## 2020-08-30 ENCOUNTER — Ambulatory Visit (HOSPITAL_COMMUNITY)
Admission: RE | Admit: 2020-08-30 | Discharge: 2020-08-30 | Disposition: A | Discharge status: Home / Self Care | Payer: Medicare Other | Source: Ambulatory Visit | Attending: Family Medicine | Admitting: Family Medicine

## 2020-08-30 ENCOUNTER — Other Ambulatory Visit: Payer: Self-pay

## 2020-08-30 DIAGNOSIS — Z1231 Encounter for screening mammogram for malignant neoplasm of breast: Secondary | ICD-10-CM | POA: Diagnosis not present

## 2020-10-28 ENCOUNTER — Other Ambulatory Visit: Payer: Self-pay | Admitting: Cardiovascular Disease

## 2020-10-28 ENCOUNTER — Other Ambulatory Visit: Payer: Self-pay | Admitting: Internal Medicine

## 2020-10-28 DIAGNOSIS — E782 Mixed hyperlipidemia: Secondary | ICD-10-CM

## 2020-10-31 ENCOUNTER — Other Ambulatory Visit: Payer: Self-pay | Admitting: *Deleted

## 2020-10-31 MED ORDER — AMLODIPINE BESYLATE 10 MG PO TABS: 10.0000 mg | ORAL_TABLET | Freq: Every day | ORAL | 0 refills | Status: DC

## 2020-11-23 ENCOUNTER — Encounter: Payer: Medicare Other | Admitting: Family Medicine

## 2020-11-27 DIAGNOSIS — Z23 Encounter for immunization: Secondary | ICD-10-CM | POA: Diagnosis not present

## 2021-01-01 ENCOUNTER — Encounter: Payer: Medicare Other | Admitting: Nurse Practitioner

## 2021-01-02 ENCOUNTER — Encounter: Payer: Self-pay | Admitting: Nurse Practitioner

## 2021-01-02 ENCOUNTER — Other Ambulatory Visit: Payer: Self-pay

## 2021-01-02 ENCOUNTER — Ambulatory Visit (INDEPENDENT_AMBULATORY_CARE_PROVIDER_SITE_OTHER): Payer: Medicare Other | Admitting: Nurse Practitioner

## 2021-01-02 VITALS — BP 138/78 | HR 78 | Ht 61.0 in | Wt 134.0 lb

## 2021-01-02 DIAGNOSIS — M8580 Other specified disorders of bone density and structure, unspecified site: Secondary | ICD-10-CM

## 2021-01-02 DIAGNOSIS — E782 Mixed hyperlipidemia: Secondary | ICD-10-CM | POA: Diagnosis not present

## 2021-01-02 DIAGNOSIS — Z23 Encounter for immunization: Secondary | ICD-10-CM

## 2021-01-02 DIAGNOSIS — I10 Essential (primary) hypertension: Secondary | ICD-10-CM | POA: Diagnosis not present

## 2021-01-02 MED ORDER — UNABLE TO FIND: 0 refills | Status: DC

## 2021-01-02 NOTE — Assessment & Plan Note (Addendum)
Avoid salty foods,fatty food,  canned food and continue to engage in regular exercise.  BP Readings from Last 3 Encounters:  01/02/21 (!) 146/79  07/06/20 (!) 144/90  06/14/20 132/73  recheck BP 138/78

## 2021-01-02 NOTE — Patient Instructions (Signed)
Please get your COVID, Pneumonia, and shingle vaccine at your pharmacy. Get your labs done today.  It is important that you exercise regularly at least 30 minutes 5 times a week.  Think about what you will eat, plan ahead. Choose " clean, green, fresh or frozen" over canned, processed or packaged foods which are more sugary, salty and fatty. 70 to 75% of food eaten should be vegetables and fruit. Three meals at set times with snacks allowed between meals, but they must be fruit or vegetables. Aim to eat over a 12 hour period , example 7 am to 7 pm, and STOP after  your last meal of the day. Drink water,generally about 64 ounces per day, no other drink is as healthy. Fruit juice is best enjoyed in a healthy way, by EATING the fruit.  Thanks for choosing [facility], we consider it a privelige to serve you.

## 2021-01-02 NOTE — Assessment & Plan Note (Signed)
Ha an appointment coming up with cardiology in Dec.  Continue repatha.  Avoid fatty fried food, butter , cheeses, engage in regular vigorous exercise. Lipid panel today.

## 2021-01-02 NOTE — Progress Notes (Signed)
   Alyssa Navarro     MRN: [id number]      DOB: 03/04/1950   HPI Alyssa Navarro is here for follow up and re-evaluation of chronic medical conditions, medication management and review of any available recent lab and radiology data.  Preventive health is updated, specifically  Cancer screening and Immunization.   Questions or concerns regarding consultations or procedures which the PT has had in the interim are  addressed. The PT denies any adverse reactions to current medications since the last visit.    PT c/o insomnia for months, no problems in the summer time except in the winter time, turning the TV on helps her to go to sleep.    Pt stated that she has a better appetite, protein and small amount of carbs She wears  support socks  to help the swelling in her  left ankle., will see otho in January Will see cardiology on Dec. 9th. For follow up.  She was supposed to get colonoscopy this year but could not due to her ankle injury earlier this year. PT had colo guard three years ago. PT want a referral during her next visit for colonoscopy.  ROS Denies recent fever or chills. Denies sinus pressure, nasal congestion, ear pain or sore throat. Denies chest congestion, productive cough or wheezing. Denies chest pains, palpitations and leg swelling Denies abdominal pain, nausea, vomiting,diarrhea or constipation.   Denies dysuria, frequency, hesitancy or incontinence. Denies joint pain, swelling and limitation in mobility. Denies headaches, seizures, numbness, or tingling. Denies depression, anxiety or insomnia. Denies skin break down or rash.   PE  BP (!) 146/79 (BP Location: Left Arm, Patient Position: Sitting, Cuff Size: Normal)   Pulse 78   Ht 5\' 1"  (1.549 m)   Wt 134 lb (60.8 kg)   SpO2 93%   BMI 25.32 kg/m   Patient alert and oriented and in no cardiopulmonary distress.  HEENT: No facial asymmetry, EOMI,     Neck supple .  Chest: Clear to auscultation bilaterally.  CVS: S1,  S2 no murmurs, no S3.Regular rate.  ABD: Soft non tender.   Ext: No edema  MS: Adequate ROM spine, shoulders, hips and knees.  Skin: Intact, no ulcerations or rash noted.  Psych: Good eye contact, normal affect. Memory intact not anxious or depressed appearing.  CNS: CN 2-12 intact, power,  normal throughout.no focal deficits noted.   Assessment & Plan

## 2021-01-02 NOTE — Assessment & Plan Note (Signed)
Left ankle fracture completely healed. Takes fosamax 70mg  weekly.

## 2021-01-02 NOTE — Assessment & Plan Note (Signed)
willl get her pneumonia vacc at the pharmacy

## 2021-01-03 LAB — CMP14+EGFR
ALT: 13 IU/L (ref 0–32)
AST: 16 IU/L (ref 0–40)
Albumin/Globulin Ratio: 1.7 (ref 1.2–2.2)
Albumin: 4.5 g/dL (ref 3.7–4.7)
Alkaline Phosphatase: 104 IU/L (ref 44–121)
BUN/Creatinine Ratio: 16 (ref 12–28)
BUN: 12 mg/dL (ref 8–27)
Bilirubin Total: 0.2 mg/dL (ref 0.0–1.2)
CO2: 27 mmol/L (ref 20–29)
Calcium: 10.2 mg/dL (ref 8.7–10.3)
Chloride: 103 mmol/L (ref 96–106)
Creatinine, Ser: 0.73 mg/dL (ref 0.57–1.00)
Globulin, Total: 2.7 g/dL (ref 1.5–4.5)
Glucose: 87 mg/dL (ref 70–99)
Potassium: 3.9 mmol/L (ref 3.5–5.2)
Sodium: 143 mmol/L (ref 134–144)
Total Protein: 7.2 g/dL (ref 6.0–8.5)
eGFR: 87 mL/min/{1.73_m2} (ref >59)

## 2021-01-03 LAB — LIPID PANEL
Chol/HDL Ratio: 5.8 ratio — ABNORMAL HIGH (ref 0.0–4.4)
Cholesterol, Total: 302 mg/dL — ABNORMAL HIGH (ref 100–199)
HDL: 52 mg/dL (ref >39)
LDL Chol Calc (NIH): 194 mg/dL — ABNORMAL HIGH (ref 0–99)
Triglycerides: 286 mg/dL — ABNORMAL HIGH (ref 0–149)
VLDL Cholesterol Cal: 56 mg/dL — ABNORMAL HIGH (ref 5–40)

## 2021-01-10 DIAGNOSIS — Z23 Encounter for immunization: Secondary | ICD-10-CM | POA: Diagnosis not present

## 2021-01-12 ENCOUNTER — Encounter: Payer: Self-pay | Admitting: Cardiovascular Disease

## 2021-01-12 ENCOUNTER — Ambulatory Visit (INDEPENDENT_AMBULATORY_CARE_PROVIDER_SITE_OTHER): Payer: Medicare Other | Admitting: Cardiovascular Disease

## 2021-01-12 ENCOUNTER — Other Ambulatory Visit: Payer: Self-pay

## 2021-01-12 VITALS — BP 152/86 | HR 63 | Ht 61.0 in | Wt 136.2 lb

## 2021-01-12 DIAGNOSIS — I10 Essential (primary) hypertension: Secondary | ICD-10-CM

## 2021-01-12 DIAGNOSIS — I2510 Atherosclerotic heart disease of native coronary artery without angina pectoris: Secondary | ICD-10-CM | POA: Diagnosis not present

## 2021-01-12 DIAGNOSIS — R0989 Other specified symptoms and signs involving the circulatory and respiratory systems: Secondary | ICD-10-CM | POA: Diagnosis not present

## 2021-01-12 DIAGNOSIS — E782 Mixed hyperlipidemia: Secondary | ICD-10-CM

## 2021-01-12 MED ORDER — ATORVASTATIN CALCIUM 40 MG PO TABS: 40.0000 mg | ORAL_TABLET | Freq: Every day | ORAL | 3 refills | Status: DC

## 2021-01-12 NOTE — Patient Instructions (Signed)
Medication Instructions:  START Atorvastatin 40 mg once daily  STAY on the Repatha  *If you need a refill on your cardiac medications before your next appointment, please call your pharmacy*   Lab Work: None ordered If you have labs (blood work) drawn today and your tests are completely normal, you will receive your results only by: [place] (if you have MyChart) OR A paper copy in the mail If you have any lab test that is abnormal or we need to change your treatment, we will call you to review the results.   Testing/Procedures: None ordered   Follow-Up: At [facility], you and your health needs are our priority.  As part of our continuing mission to provide you with exceptional heart care, we have created designated Provider Care Teams.  These Care Teams include your primary Cardiologist (physician) and Advanced Practice Providers (APPs -  Physician Assistants and Nurse Practitioners) who all work together to provide you with the care you need, when you need it.  We recommend signing up for the patient portal called "MyChart".  Sign up information is provided on this After Visit Summary.  MyChart is used to connect with patients for Virtual Visits (Telemedicine).  Patients are able to view lab/test results, encounter notes, upcoming appointments, etc.  Non-urgent messages can be sent to your provider as well.   To learn more about what you can do with MyChart, go to [email or web address].    Your next appointment:   6 month(s)  The format for your next appointment:   In Person  Provider:   Sanda Klein, MD

## 2021-01-12 NOTE — Progress Notes (Signed)
Cardiology Office Note:    Date:  01/13/2021    ID:  LOVELLE Navarro, DOB 11-Oct-1950, MRN [id number]  PCP:  Renee Rival, FNP  [facility] Cardiologist:  Alyssa Klein, MD  [facility] Electrophysiologist:  None   Referring MD: Alyssa Helper, MD   CC: CAD follow up   History of Present Illness:    Alyssa Navarro is a 70 y.o. female with a hx of HTN on Coreg and Amlodipine, CAD with stent placement in 1998, [place] who presents today for a 1 year follow up of CAD.   At the beginning of the year she stepped into a pile of snow and twisted her ankle, fractured several bones.  She required surgery.  She has recovered well from this and can walk without supportive devices.  She has not had any cardiac events.  The patient specifically denies any chest pain at or with rest exertion, dyspnea at rest or with exertion, orthopnea, paroxysmal nocturnal dyspnea, syncope, palpitations, focal neurological deficits, intermittent claudication, lower extremity edema, unexplained weight gain, cough, hemoptysis or wheezing.  She pays a lot of attention to eating a healthy diet.  She does not know why, but one of her primary care provider is discontinued her atorvastatin.  Her follow-up lipid profile showed a marked increase in LDL which reached 194, despite continued compliance with Repatha.  When she was taking both atorvastatin and Repatha her LDL was in the 50s.  Her baseline LDL is around 260, when not taking any medications.  Recent liver function tests showed an AST of 16, ALT of 13 on 01/02/2001.  All liver tests were within normal range.  Her blood pressure is on the high side today, but at home she states that is typically around 130/70.  This is pretty typical for her, she also has situational hypertension.    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 She required percutaneous  revascularization in 1998 (3.5x25 NIR to mid dominant left circumflex artery, 3.0x16 NIR to distal LCX, 50-60% mid LAD untreated), but no further procedures since that time.  Past Medical History:  Diagnosis Date   Allergy    Anemia    Arthritis    Coronary artery disease    Hyperlipidemia    Hypertension    Screening for malignant neoplasm of the cervix 06/23/2013   Trichimoniasis 07/19/2013    Past Surgical History:  Procedure Laterality Date   2D Echocardiogram  12/22/2007   EF >55%, normal.   ABDOMINAL HYSTERECTOMY     ANKLE FRACTURE SURGERY Left 02/17/2020   APPENDECTOMY     ARTHROSCOPY WITH ANTERIOR CRUCIATE LIGAMENT (ACL) REPAIR WITH ANTERIOR TIBILIAS GRAFT Right 2007   BUNIONECTOMY Bilateral    CARDIAC CATHETERIZATION  02/12/1991   CAD demonstrated in Proximal LAD-50% stenosis, Distal LAD-60%, Mid Circumflex-60% stenosis, Marginal Circumflex-80% stenosis, Proximal RCA-70% stenosis   CARDIAC CATHETERIZATION  11/12/1993   Mild-moderate 3-vessel disease-50% mid LAD, 75% mid circumflex, 80% ostial portion L circumflex.   CARDIAC CATHETERIZATION  12/03/1996   95% mid circumflex stenosis stented with a 3.5x73mm Sci-Med NIR, resulting in reduciton to 0%. 75% distal circumflex stenosis stented with a 3.0x42mm NIR stent resulting in reduciton to 0%.   CARDIAC CATHETERIZATION  08/23/2003   Continued medical therapy. Patent L circumflex stents placed in 1998.   CHOLECYSTECTOMY     COLONOSCOPY N/A 12/21/2014   Procedure: COLONOSCOPY;  Surgeon: Rogene Houston, MD;  Location: [facility];  Service: Endoscopy;  Laterality: N/A;  [place]  12/22/2007   No ECG changes, nondiagnostic EKG. Perfusion defect in inferior myocardial region consistent with diaphragmatic attenuation.   ORIF ANKLE FRACTURE Left 02/17/2020   Procedure: OPEN REDUCTION INTERNAL FIXATION (ORIF) ANKLE FRACTURE;  Surgeon: [name], MD;  Location: [facility] OR[facility];  Service: Orthopedics;  Laterality: Left;   TUBAL  LIGATION      Current Medications: Current Meds  Medication Sig   Acetaminophen (TYLENOL 8 HOUR PO) Take 1 tablet by mouth as needed.   alendronate (FOSAMAX) 70 MG tablet Take 1 tablet (70 mg total) by mouth every 7 (seven) days. Take with a full glass of water on an empty stomach.   amLODipine (NORVASC) 10 MG tablet Take 1 tablet (10 mg total) by mouth daily.   atorvastatin (LIPITOR) 40 MG tablet Take 1 tablet (40 mg total) by mouth daily.   Calcium Carb-Cholecalciferol (CALTRATE 600+D3 PO) Take 1 tablet by mouth daily.   carvedilol (COREG) 6.25 MG tablet TAKE 1 TABLET(6.25 MG) BY MOUTH TWICE DAILY   clobetasol ointment (TEMOVATE) 1.61 % Apply 1 application topically daily as needed for rash.   Coenzyme Q10 (CO Q10) 100 MG CAPS Take 200 mg by mouth daily.    Multiple Vitamins-Minerals (CENTRUM SILVER PO) Take 1 tablet by mouth daily.   REPATHA SURECLICK 096 MG/ML SOAJ ADMINISTER 1 ML UNDER THE SKIN EVERY 14 DAYS    Allergies:   Avelox [moxifloxacin hcl in nacl], Quinolones, Statins, Latex, Metoprolol, and Sulfa antibiotics   Social History   Socioeconomic History   Marital status: Divorced    Spouse name: Not on file   Number of children: Not on file   Years of education: Not on file   Highest education level: Not on file  Occupational History   Not on file  Tobacco Use   Smoking status: Never   Smokeless tobacco: Never  Vaping Use   Vaping Use: Never used  Substance and Sexual Activity   Alcohol use: No   Drug use: No   Sexual activity: Yes    Birth control/protection: Surgical  Other Topics Concern   Not on file  Social History Narrative   Not on file   Social Determinants of Health   Financial Resource Strain: Low Risk    Difficulty of Paying Living Expenses: Not very hard  Food Insecurity: No Food Insecurity   Worried About Running Out of Food in the Last Year: Never true   Ran Out of Food in the Last Year: Never true  Transportation Needs: No Transportation  Needs   Lack of Transportation (Medical): No   Lack of Transportation (Non-Medical): No  Physical Activity: Insufficiently Active   Days of Exercise per Week: 3 days   Minutes of Exercise per Session: 30 min  Stress: No Stress Concern Present   Feeling of Stress : Not at all  Social Connections: Socially Isolated   Frequency of Communication with Friends and Family: More than three times a week   Frequency of Social Gatherings with Friends and Family: Twice a week   Attends Religious Services: Never   Marine scientist or Organizations: No   Attends Music therapist: Never   Marital Status: Divorced     Family History: The patient's family history includes Breast cancer in her maternal aunt and paternal grandmother; Cancer in her maternal aunt and maternal grandmother; Diabetes in her maternal grandmother, mother, and another family  member; Fibroids in her sister; Heart disease in her sister; Heart failure in her paternal grandmother and another family member; Hyperlipidemia in her father and mother; Hypertension in her mother, paternal grandmother, and another family member.  ROS:   Please see the history of present illness.    All other systems reviewed and are negative.  EKGs/Labs/Other Studies Reviewed:    EKG:  EKG ordered today is personally reviewed and is very similar to previous tracings.  She has normal sinus rhythm and barely noticeable nonspecific ST segment changes in leads III and aVF.  QTc 444 ms.    Recent Labs: 07/06/2020: Hemoglobin 13.2; Platelets 227; TSH 0.784 01/02/2021: ALT 13; BUN 12; Creatinine, Ser 0.73; Potassium 3.9; Sodium 143  Recent Lipid Panel    Component Value Date/Time   CHOL 302 (H) 01/02/2021 0847   CHOL 246 08/21/2018 0910   TRIG 286 (H) 01/02/2021 0847   TRIG 211 (A) 08/21/2018 0910   HDL 52 01/02/2021 0847   HDL 58 08/21/2018 0910   CHOLHDL 5.8 (H) 01/02/2021 0847   CHOLHDL 4.0 03/21/2017 0743   VLDL 46 (H) 09/22/2015  0835   LDLCALC 194 (H) 01/02/2021 0847    Physical Exam:    VS:  BP (!) 152/86 (BP Location: Right Arm, Patient Position: Sitting, Cuff Size: Normal)   Pulse 63   Ht 5\' 1"  (1.549 m)   Wt 136 lb 3.2 oz (61.8 kg)   SpO2 98%   BMI 25.73 kg/m     Wt Readings from Last 3 Encounters:  01/12/21 136 lb 3.2 oz (61.8 kg)  01/02/21 134 lb (60.8 kg)  07/06/20 133 lb 12.8 oz (60.7 kg)     General: Alert, oriented x3, no distress, looks younger than stated Head: no evidence of trauma, PERRL, EOMI, no exophtalmos or lid lag, no myxedema, no xanthelasma; normal ears, nose and oropharynx Neck: normal jugular venous pulsations and no hepatojugular reflux; brisk carotid pulses without delay, faint right carotid bruit Chest: clear to auscultation, no signs of consolidation by percussion or palpation, normal fremitus, symmetrical and full respiratory excursions Cardiovascular: normal position and quality of the apical impulse, regular rhythm, normal first and second heart sounds, no murmurs, rubs or gallops Abdomen: no tenderness or distention, no masses by palpation, no abnormal pulsatility or arterial bruits, normal bowel sounds, no hepatosplenomegaly Extremities: no clubbing, cyanosis or edema; 2+ radial, ulnar and brachial pulses bilaterally; 2+ right femoral, posterior tibial and dorsalis pedis pulses; 2+ left femoral, posterior tibial and dorsalis pedis pulses; no subclavian or femoral bruits Neurological: grossly nonfocal Psych: Normal mood and affect   ASSESSMENT:    1. Coronary artery disease involving native coronary artery of native heart without angina pectoris   2. Essential hypertension   3. Mixed hyperlipidemia   4. Right carotid bruit     PLAN:    In order of problems listed above:  CAD: Asymptomatic.  On aggressive lipid-lowering therapy she had an excellent lipid profile and we need to resume that.  Continue beta-blocker and low-dose aspirin. HTN: Slightly above target range  today, but usually much better at home.  No change in medications. HLP: She did best on combination of statin plus Repatha.  We will resume atorvastatin at the previous dose of 40 mg daily. Right carotid bruit: No obstructive lesions, just mild plaque on ultrasound.  Medication Adjustments/Labs and Tests Ordered: Current medicines are reviewed at length with the patient today.  Concerns regarding medicines are outlined above.  Orders Placed This Encounter  Procedures   EKG 12-Lead    Meds ordered this encounter  Medications   atorvastatin (LIPITOR) 40 MG tablet    Sig: Take 1 tablet (40 mg total) by mouth daily.    Dispense:  90 tablet    Refill:  3     Patient Instructions  Medication Instructions:  START Atorvastatin 40 mg once daily  STAY on the Repatha  *If you need a refill on your cardiac medications before your next appointment, please call your pharmacy*   Lab Work: None ordered If you have labs (blood work) drawn today and your tests are completely normal, you will receive your results only by: [place] (if you have MyChart) OR A paper copy in the mail If you have any lab test that is abnormal or we need to change your treatment, we will call you to review the results.   Testing/Procedures: None ordered   Follow-Up: At [facility], you and your health needs are our priority.  As part of our continuing mission to provide you with exceptional heart care, we have created designated Provider Care Teams.  These Care Teams include your primary Cardiologist (physician) and Advanced Practice Providers (APPs -  Physician Assistants and Nurse Practitioners) who all work together to provide you with the care you need, when you need it.  We recommend signing up for the patient portal called "MyChart".  Sign up information is provided on this After Visit Summary.  MyChart is used to connect with patients for Virtual Visits (Telemedicine).  Patients are able to view  lab/test results, encounter notes, upcoming appointments, etc.  Non-urgent messages can be sent to your provider as well.   To learn more about what you can do with MyChart, go to [email or web address].    Your next appointment:   6 month(s)  The format for your next appointment:   In Person  Provider:   Sanda Klein, MD       Signed, Dr. Jose Persia Internal Medicine PGY-2  01/13/2021, 5:12 PM  I have seen and examined the patient along with Dr. Jose Persia.  I have reviewed the chart, notes and new data.  I agree with her note.  Key new complaints: no CV symptoms Key examination changes: R carotid bruit is new, BP elevation is not typical Key new findings / data: excellent lipid parameters  PLAN: Check carotid Doppler. Continue same meds  Alyssa Klein, MD, [place] [phone] 01/13/2021, 5:12 PM

## 2021-01-13 ENCOUNTER — Encounter: Payer: Self-pay | Admitting: Cardiovascular Disease

## 2021-01-15 DIAGNOSIS — Z23 Encounter for immunization: Secondary | ICD-10-CM | POA: Diagnosis not present

## 2021-01-26 ENCOUNTER — Other Ambulatory Visit: Payer: Self-pay | Admitting: Cardiovascular Disease

## 2021-02-03 ENCOUNTER — Other Ambulatory Visit: Payer: Self-pay | Admitting: Family Medicine

## 2021-02-16 ENCOUNTER — Other Ambulatory Visit: Payer: Self-pay

## 2021-02-16 ENCOUNTER — Ambulatory Visit (INDEPENDENT_AMBULATORY_CARE_PROVIDER_SITE_OTHER): Payer: Medicare Other | Admitting: Orthopedic Surgery

## 2021-02-16 ENCOUNTER — Encounter: Payer: Self-pay | Admitting: Orthopedic Surgery

## 2021-02-16 ENCOUNTER — Ambulatory Visit: Payer: Medicare Other

## 2021-02-16 DIAGNOSIS — M25572 Pain in left ankle and joints of left foot: Secondary | ICD-10-CM

## 2021-02-16 NOTE — Progress Notes (Signed)
Orthopaedic Postop Note  Assessment: Alyssa Navarro is a 71 y.o. female s/p ORIF of Left ankle fracture  DOS: 02/17/2020  Plan: Radiographs stable No issues with the hardware Occasional swelling when on her feet, consider compression stockings Call with issues Follow up as needed   Follow-up: Return if symptoms worsen or fail to improve.  Subjective:  Chief Complaint  Patient presents with   Post-op Follow-up    1 year left ankle ORIF has done well     History of Present Illness: Alyssa Navarro is a 71 y.o. female who presents following the above stated procedure.  Surgery was approximately 1 year ago.  She has no pain.  She walks without issues.  She notices occasional swelling in the foot, but this improves with rest.  She does not notice the hardware.  No pain in the ankle.  Review of Systems: No fevers or chills No numbness or tingling No Chest Pain No shortness of breath   Objective: There were no vitals taken for this visit.  Physical Exam:  Alert and oriented, no acute distress  Normal gait.  Incisions are well-healed.  No surrounding erythema or drainage.  Hardware is not easily palpable.  No tenderness to palpation along the incisions.  5/5 strength.  Sensations intact over the dorsum of her foot.  Toes are warm and well-perfused.   IMAGING: I personally ordered and reviewed the following images   X-ray of the left ankle was obtained in clinic today.  No acute injuries are noted.  Hardware remains in stable position.  No evidence of hardware failure or loosening.  Mortise is intact.  No syndesmotic disruption.  Impression: Healed left bimalleolar ankle fracture without hardware failure   [name], MD 02/16/2021 9:35 AM

## 2021-02-22 ENCOUNTER — Ambulatory Visit (INDEPENDENT_AMBULATORY_CARE_PROVIDER_SITE_OTHER): Payer: Medicare Other

## 2021-02-22 ENCOUNTER — Ambulatory Visit: Payer: Medicare Other | Admitting: Family Medicine

## 2021-02-22 ENCOUNTER — Other Ambulatory Visit: Payer: Self-pay

## 2021-02-22 DIAGNOSIS — Z0000 Encounter for general adult medical examination without abnormal findings: Secondary | ICD-10-CM

## 2021-02-22 NOTE — Patient Instructions (Addendum)
Alyssa Navarro , Thank you for taking time to come for your Medicare Wellness Visit. I appreciate your ongoing commitment to your health goals. Please review the following plan we discussed and let me know if I can assist you in the future.   These are the goals we discussed:  Goals      Protect My Health        This is a list of the screening recommended for you and due dates:  Health Maintenance  Topic Date Due   Hepatitis C Screening: USPSTF Recommendation to screen - Ages 23-79 yo.  Never done   Zoster (Shingles) Vaccine (1 of 2) Never done   COVID-19 Vaccine (4 - Booster for Moderna series) 02/15/2020   Mammogram  08/31/2022   Tetanus Vaccine  09/11/2022   Colon Cancer Screening  12/20/2024   Pneumonia Vaccine  Completed   Flu Shot  Completed   DEXA scan (bone density measurement)  Completed   HPV Vaccine  Aged Out    Health Maintenance, Female Adopting a healthy lifestyle and getting preventive care are important in promoting health and wellness. Ask your health care provider about: The right schedule for you to have regular tests and exams. Things you can do on your own to prevent diseases and keep yourself healthy. What should I know about diet, weight, and exercise? Eat a healthy diet  Eat a diet that includes plenty of vegetables, fruits, low-fat dairy products, and lean protein. Do not eat a lot of foods that are high in solid fats, added sugars, or sodium. Maintain a healthy weight Body mass index (BMI) is used to identify weight problems. It estimates body fat based on height and weight. Your health care provider can help determine your BMI and help you achieve or maintain a healthy weight. Get regular exercise Get regular exercise. This is one of the most important things you can do for your health. Most adults should: Exercise for at least 150 minutes each week. The exercise should increase your heart rate and make you sweat (moderate-intensity exercise). Do  strengthening exercises at least twice a week. This is in addition to the moderate-intensity exercise. Spend less time sitting. Even light physical activity can be beneficial. Watch cholesterol and blood lipids Have your blood tested for lipids and cholesterol at 73 years of age, then have this test every 5 years. Have your cholesterol levels checked more often if: Your lipid or cholesterol levels are high. You are older than 73 years of age. You are at high risk for heart disease. What should I know about cancer screening? Depending on your health history and family history, you may need to have cancer screening at various ages. This may include screening for: Breast cancer. Cervical cancer. Colorectal cancer. Skin cancer. Lung cancer. What should I know about heart disease, diabetes, and high blood pressure? Blood pressure and heart disease High blood pressure causes heart disease and increases the risk of stroke. This is more likely to develop in people who have high blood pressure readings or are overweight. Have your blood pressure checked: Every 3-5 years if you are 24-72 years of age. Every year if you are 35 years old or older. Diabetes Have regular diabetes screenings. This checks your fasting blood sugar level. Have the screening done: Once every three years after age 44 if you are at a normal weight and have a low risk for diabetes. More often and at a younger age if you are overweight or have a  high risk for diabetes. What should I know about preventing infection? Hepatitis B If you have a higher risk for hepatitis B, you should be screened for this virus. Talk with your health care provider to find out if you are at risk for hepatitis B infection. Hepatitis C Testing is recommended for: Everyone born from [birth year] through 1967. Anyone with known risk factors for hepatitis C. Sexually transmitted infections (STIs) Get screened for STIs, including gonorrhea and chlamydia,  if: You are sexually active and are younger than 73 years of age. You are older than 73 years of age and your health care provider tells you that you are at risk for this type of infection. Your sexual activity has changed since you were last screened, and you are at increased risk for chlamydia or gonorrhea. Ask your health care provider if you are at risk. Ask your health care provider about whether you are at high risk for HIV. Your health care provider may recommend a prescription medicine to help prevent HIV infection. If you choose to take medicine to prevent HIV, you should first get tested for HIV. You should then be tested every 3 months for as long as you are taking the medicine. Pregnancy If you are about to stop having your period (premenopausal) and you may become pregnant, seek counseling before you get pregnant. Take 400 to 800 micrograms (mcg) of folic acid every day if you become pregnant. Ask for birth control (contraception) if you want to prevent pregnancy. Osteoporosis and menopause Osteoporosis is a disease in which the bones lose minerals and strength with aging. This can result in bone fractures. If you are 88 years old or older, or if you are at risk for osteoporosis and fractures, ask your health care provider if you should: Be screened for bone loss. Take a calcium or vitamin D supplement to lower your risk of fractures. Be given hormone replacement therapy (HRT) to treat symptoms of menopause. Follow these instructions at home: Alcohol use Do not drink alcohol if: Your health care provider tells you not to drink. You are pregnant, may be pregnant, or are planning to become pregnant. If you drink alcohol: Limit how much you have to: 0-1 drink a day. Know how much alcohol is in your drink. In the [place] one drink equals one 12 oz bottle of beer (355 mL), one 5 oz glass of wine (148 mL), or one 1 oz glass of hard liquor (44 mL). Lifestyle Do not use any products that  contain nicotine or tobacco. These products include cigarettes, chewing tobacco, and vaping devices, such as e-cigarettes. If you need help quitting, ask your health care provider. Do not use street drugs. Do not share needles. Ask your health care provider for help if you need support or information about quitting drugs. General instructions Schedule regular health, dental, and eye exams. Stay current with your vaccines. Tell your health care provider if: You often feel depressed. You have ever been abused or do not feel safe at home. Summary Adopting a healthy lifestyle and getting preventive care are important in promoting health and wellness. Follow your health care provider's instructions about healthy diet, exercising, and getting tested or screened for diseases. Follow your health care provider's instructions on monitoring your cholesterol and blood pressure. This information is not intended to replace advice given to you by your health care provider. Make sure you discuss any questions you have with your health care provider. Document Revised: 06/12/2020 Document Reviewed: 06/12/2020 Elsevier Patient  Education  2022 [employer].

## 2021-02-22 NOTE — Progress Notes (Signed)
Subjective:   Alyssa Navarro is a 71 y.o. female who presents for Medicare Annual (Subsequent) preventive examination.  Review of Systems    Defer to PCP Cardiac Risk Factors include: hypertension;advanced age (>64men, >36 women)     Objective:    Today's Vitals   02/22/21 1057  PainSc: 0-No pain   There is no height or weight on file to calculate BMI.  Advanced Directives 02/22/2021 02/22/2020 02/17/2020 02/15/2020 02/07/2020 12/21/2014  Does Patient Have a Medical Advance Directive? No Yes No No No Yes  Type of Advance Directive - Living will - - - [place];Living will  Does patient want to make changes to medical advance directive? No - Patient declined No - Patient declined - - - -  Copy of [place] in Chart? - - - - - No - copy requested  Would patient like information on creating a medical advance directive? No - Patient declined No - Patient declined No - Patient declined No - Patient declined - -    Current Medications (verified) Outpatient Encounter Medications as of 02/22/2021  Medication Sig   Acetaminophen (TYLENOL 8 HOUR PO) Take 1 tablet by mouth as needed.   alendronate (FOSAMAX) 70 MG tablet TAKE 1 TABLET(70 MG) BY MOUTH EVERY 7 DAYS WITH A FULL GLASS OF WATER AND ON AN EMPTY STOMACH   amLODipine (NORVASC) 10 MG tablet TAKE 1 TABLET(10 MG) BY MOUTH DAILY   atorvastatin (LIPITOR) 40 MG tablet Take 1 tablet (40 mg total) by mouth daily.   Calcium Carb-Cholecalciferol (CALTRATE 600+D3 PO) Take 1 tablet by mouth daily.   carvedilol (COREG) 6.25 MG tablet TAKE 1 TABLET(6.25 MG) BY MOUTH TWICE DAILY   clobetasol ointment (TEMOVATE) 2.63 % Apply 1 application topically daily as needed for rash.   Coenzyme Q10 (CO Q10) 100 MG CAPS Take 200 mg by mouth daily.    Multiple Vitamins-Minerals (CENTRUM SILVER PO) Take 1 tablet by mouth daily.   REPATHA SURECLICK 335 MG/ML SOAJ ADMINISTER 1 ML UNDER THE SKIN EVERY 14 DAYS   UNABLE TO FIND  Prevnar 20 Vaccine x 1 (Patient not taking: Reported on 01/12/2021)   No facility-administered encounter medications on file as of 02/22/2021.    Allergies (verified) Avelox [moxifloxacin hcl in nacl], Quinolones, Statins, Latex, Metoprolol, and Sulfa antibiotics   History: Past Medical History:  Diagnosis Date   Allergy    Anemia    Arthritis    Coronary artery disease    Hyperlipidemia    Hypertension    Screening for malignant neoplasm of the cervix 06/23/2013   Trichimoniasis 07/19/2013   Past Surgical History:  Procedure Laterality Date   2D Echocardiogram  12/22/2007   EF >55%, normal.   ABDOMINAL HYSTERECTOMY     ANKLE FRACTURE SURGERY Left 02/17/2020   APPENDECTOMY     ARTHROSCOPY WITH ANTERIOR CRUCIATE LIGAMENT (ACL) REPAIR WITH ANTERIOR TIBILIAS GRAFT Right 2007   BUNIONECTOMY Bilateral    CARDIAC CATHETERIZATION  02/12/1991   CAD demonstrated in Proximal LAD-50% stenosis, Distal LAD-60%, Mid Circumflex-60% stenosis, Marginal Circumflex-80% stenosis, Proximal RCA-70% stenosis   CARDIAC CATHETERIZATION  11/12/1993   Mild-moderate 3-vessel disease-50% mid LAD, 75% mid circumflex, 80% ostial portion L circumflex.   CARDIAC CATHETERIZATION  12/03/1996   95% mid circumflex stenosis stented with a 3.5x77mm Sci-Med NIR, resulting in reduciton to 0%. 75% distal circumflex stenosis stented with a 3.0x66mm NIR stent resulting in reduciton to 0%.   CARDIAC CATHETERIZATION  08/23/2003   Continued medical  therapy. Patent L circumflex stents placed in 1998.   CHOLECYSTECTOMY     COLONOSCOPY N/A 12/21/2014   Procedure: COLONOSCOPY;  Surgeon: Rogene Houston, MD;  Location: [facility];  Service: Endoscopy;  Laterality: N/A;  [place]  12/22/2007   No ECG changes, nondiagnostic EKG. Perfusion defect in inferior myocardial region consistent with diaphragmatic attenuation.   ORIF ANKLE FRACTURE Left 02/17/2020   Procedure: OPEN REDUCTION INTERNAL FIXATION (ORIF) ANKLE  FRACTURE;  Surgeon: [name], MD;  Location: [facility];  Service: Orthopedics;  Laterality: Left;   TUBAL LIGATION     Family History  Problem Relation Age of Onset   Diabetes Mother    Hypertension Mother    Hyperlipidemia Mother    Heart failure Other    Hypertension Other    Diabetes Other    Hyperlipidemia Father    Heart disease Sister    Cancer Maternal Aunt        breast   Breast cancer Maternal Aunt    Cancer Maternal Grandmother        breast   Diabetes Maternal Grandmother    Hypertension Paternal Grandmother    Heart failure Paternal Grandmother    Breast cancer Paternal Grandmother    Fibroids Sister    Social History   Socioeconomic History   Marital status: Divorced    Spouse name: Not on file   Number of children: 2   Years of education: 12   Highest education level: Associate degree: occupational, Hotel manager, or vocational program  Occupational History   Occupation: retired    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[employer], technical sales  Tobacco Use   Smoking status: Never   Smokeless tobacco: Never  Vaping Use   Vaping Use: Never used  Substance and Sexual Activity   Alcohol use: No   Drug use: No   Sexual activity: Not Currently    Birth control/protection: Surgical  Other Topics Concern   Not on file  Social History Narrative   Not on file   Social Determinants of Health   Financial Resource Strain: Low Risk    Difficulty of Paying Living Expenses: Not hard at all  Food Insecurity: No Food Insecurity   Worried About Charity fundraiser in the Last Year: Never true   [place] in the Last Year: Never true  Transportation Needs: No Transportation Needs   Lack of Transportation (Medical): No   Lack of Transportation (Non-Medical): No  Physical Activity: Not on file  Stress: No Stress Concern Present   Feeling of Stress : Not at all  Social Connections: Moderately Integrated   Frequency of Communication with Friends and Family: More than three times a week    Frequency of Social Gatherings with Friends and Family: More than three times a week   Attends Religious Services: More than 4 times per year   Active Member of [employer] or Organizations: Yes   Attends Music therapist: More than 4 times per year   Marital Status: Divorced    Tobacco Counseling Counseling given: Not Answered   Clinical Intake:  Pre-visit preparation completed: No  Pain : No/denies pain Pain Score: 0-No pain     Nutritional Risks: None Diabetes: No  How often do you need to have someone help you when you read instructions, pamphlets, or other written materials from your doctor or pharmacy?: 1 - Never What is the last grade level you completed in school?: 12  Diabetic?no  Interpreter Needed?:  No  Information entered by :: [place] of Daily Living In your present state of health, do you have any difficulty performing the following activities: 02/22/2021 02/19/2021  Hearing? N N  Vision? N N  Difficulty concentrating or making decisions? Y N  Walking or climbing stairs? N N  Dressing or bathing? N N  Doing errands, shopping? N N  Preparing Food and eating ? N N  Using the Toilet? N N  In the past six months, have you accidently leaked urine? N N  Do you have problems with loss of bowel control? N N  Managing your Medications? N N  Managing your Finances? N N  Housekeeping or managing your Housekeeping? N N  Some recent data might be hidden    Patient Care Team: Renee Rival, FNP as PCP - General (Nurse Practitioner) Sanda Klein, MD as PCP - Cardiology (Cardiology)  Indicate any recent Medical Services you may have received from other than [facility] providers in the past year (date may be approximate).     Assessment:   This is a routine wellness examination for [place].  Hearing/Vision screen No results found.  Dietary issues and exercise activities discussed: Current Exercise Habits: The patient does not  participate in regular exercise at present (pt was just released from ankle surgery), Exercise limited by: orthopedic condition(s)   Goals Addressed             This Visit's Progress    Protect My Health   On track     Depression Screen PHQ 2/9 Scores 02/22/2021 01/02/2021 07/06/2020 03/08/2020 02/22/2020 02/22/2020 11/10/2019  PHQ - 2 Score 0 0 0 0 0 0 0    Fall Risk Fall Risk  02/22/2021 02/19/2021 01/02/2021 01/01/2021 07/06/2020  Falls in the past year? 0 1 1 1 1   Comment - - - - -  Number falls in past yr: 0 0 0 0 1  Comment - - - - -  Injury with Fall? 0 1 1 1 1   Risk for fall due to : - - History of fall(s) - -  Follow up Falls evaluation completed - Falls evaluation completed;Education provided;Falls prevention discussed - -    FALL RISK PREVENTION PERTAINING TO THE HOME:  Any stairs in or around the home? Yes  If so, are there any without handrails? Yes  Home free of loose throw rugs in walkways, pet beds, electrical cords, etc? No  Adequate lighting in your home to reduce risk of falls? Yes   ASSISTIVE DEVICES UTILIZED TO PREVENT FALLS:  Life alert? No  Use of a cane, walker or w/c? No  Grab bars in the bathroom? Yes  Shower chair or bench in shower? No  Elevated toilet seat or a handicapped toilet? Yes   TIMED UP AND GO:  Was the test performed? Yes .  Length of time to ambulate 10 feet: 9 sec.   Gait steady and fast without use of assistive device  Cognitive Function:     6CIT Screen 02/22/2021 02/22/2020  What Year? 0 points 0 points  What month? 0 points 0 points  What time? 0 points 0 points  Count back from 20 0 points 0 points  Months in reverse 0 points 0 points  Repeat phrase 2 points 0 points  Total Score 2 0    Immunizations Immunization History  Administered Date(s) Administered   Fluad Quad(high Dose 65+) 11/07/2020   Influenza, High Dose Seasonal PF 12/09/2017   Influenza-Unspecified 11/05/2019  Moderna Sars-Covid-2 Vaccination  03/13/2019, 04/13/2019, 12/21/2019   PNEUMOCOCCAL CONJUGATE-20 01/10/2021   Tdap 09/10/2012    TDAP status: Up to date  Flu Vaccine status: Up to date  Pneumococcal vaccine status: Up to date  Covid-19 vaccine status: Information provided on how to obtain vaccines.   Qualifies for Shingles Vaccine? Yes   Zostavax completed No   Shingrix Completed?: No.    Education has been provided regarding the importance of this vaccine. Patient has been advised to call insurance company to determine out of pocket expense if they have not yet received this vaccine. Advised may also receive vaccine at local pharmacy or Health Dept. Verbalized acceptance and understanding.  Screening Tests Health Maintenance  Topic Date Due   Hepatitis C Screening  Never done   Zoster Vaccines- Shingrix (1 of 2) Never done   COVID-19 Vaccine (4 - Booster for Moderna series) 02/15/2020   MAMMOGRAM  08/31/2022   TETANUS/TDAP  09/11/2022   COLONOSCOPY (Pts 45-75yrs Insurance coverage will need to be confirmed)  12/20/2024   Pneumonia Vaccine 69+ Years old  Completed   INFLUENZA VACCINE  Completed   DEXA SCAN  Completed   HPV VACCINES  Aged Out    Health Maintenance  Health Maintenance Due  Topic Date Due   Hepatitis C Screening  Never done   Zoster Vaccines- Shingrix (1 of 2) Never done   COVID-19 Vaccine (4 - Booster for Moderna series) 02/15/2020    Colorectal cancer screening: Type of screening: Cologuard. Completed 08/10/2019. Repeat every 3 years  Mammogram status: Completed 08/30/2020. Repeat every year  Bone Density status: Completed 09/02/2018. Results reflect: Bone density results: OSTEOPOROSIS. Repeat every 1 years.  Lung Cancer Screening: (Low Dose CT Chest recommended if Age 7-80 years, 30 pack-year currently smoking OR have quit w/in 15years.) does not qualify.   Lung Cancer Screening Referral: n/a  Additional Screening:  Hepatitis C Screening: does not qualify; Completed not at high  risk for Hep C  Vision Screening: Recommended annual ophthalmology exams for early detection of glaucoma and other disorders of the eye. Is the patient up to date with their annual eye exam?  No  Who is the provider or what is the name of the office in which the patient attends annual eye exams? [facility] If pt is not established with a provider, would they like to be referred to a provider to establish care? No .   Dental Screening: Recommended annual dental exams for proper oral hygiene  Community Resource Referral / Chronic Care Management: CRR required this visit?  No   CCM required this visit?  No      Plan:     I have personally reviewed and noted the following in the patients chart:   Medical and social history Use of alcohol, tobacco or illicit drugs  Current medications and supplements including opioid prescriptions.  Functional ability and status Nutritional status Physical activity Advanced directives List of other physicians Hospitalizations, surgeries, and ER visits in previous 12 months Vitals Screenings to include cognitive, depression, and falls Referrals and appointments  In addition, I have reviewed and discussed with patient certain preventive protocols, quality metrics, and best practice recommendations. A written personalized care plan for preventive services as well as general preventive health recommendations were provided to patient.     Earline [place], [place]   02/22/2021   Nurse Notes:  Ms. Lehane , Thank you for taking time to come for your Medicare Wellness Visit. I appreciate your  ongoing commitment to your health goals. Please review the following plan we discussed and let me know if I can assist you in the future.   These are the goals we discussed:  Goals      Protect My Health        This is a list of the screening recommended for you and due dates:  Health Maintenance  Topic Date Due   Hepatitis C Screening: USPSTF Recommendation  to screen - Ages 11-79 yo.  Never done   Zoster (Shingles) Vaccine (1 of 2) Never done   COVID-19 Vaccine (4 - Booster for Moderna series) 02/15/2020   Mammogram  08/31/2022   Tetanus Vaccine  09/11/2022   Colon Cancer Screening  12/20/2024   Pneumonia Vaccine  Completed   Flu Shot  Completed   DEXA scan (bone density measurement)  Completed   HPV Vaccine  Aged Out

## 2021-04-06 ENCOUNTER — Other Ambulatory Visit: Payer: Self-pay | Admitting: Cardiovascular Disease

## 2021-04-13 DIAGNOSIS — Z20822 Contact with and (suspected) exposure to covid-19: Secondary | ICD-10-CM | POA: Diagnosis not present

## 2021-04-26 ENCOUNTER — Other Ambulatory Visit: Payer: Self-pay | Admitting: Cardiovascular Disease

## 2021-06-02 DIAGNOSIS — Z20822 Contact with and (suspected) exposure to covid-19: Secondary | ICD-10-CM | POA: Diagnosis not present

## 2021-06-13 DIAGNOSIS — Z20822 Contact with and (suspected) exposure to covid-19: Secondary | ICD-10-CM | POA: Diagnosis not present

## 2021-06-20 ENCOUNTER — Ambulatory Visit: Payer: Medicare Other | Admitting: Orthopedic Surgery

## 2021-07-04 ENCOUNTER — Ambulatory Visit (INDEPENDENT_AMBULATORY_CARE_PROVIDER_SITE_OTHER): Payer: Medicare Other | Admitting: Nurse Practitioner

## 2021-07-04 ENCOUNTER — Encounter: Payer: Self-pay | Admitting: Nurse Practitioner

## 2021-07-04 VITALS — BP 138/86 | HR 68 | Ht 60.0 in | Wt 135.0 lb

## 2021-07-04 DIAGNOSIS — E559 Vitamin D deficiency, unspecified: Secondary | ICD-10-CM | POA: Diagnosis not present

## 2021-07-04 DIAGNOSIS — M8580 Other specified disorders of bone density and structure, unspecified site: Secondary | ICD-10-CM | POA: Diagnosis not present

## 2021-07-04 DIAGNOSIS — E782 Mixed hyperlipidemia: Secondary | ICD-10-CM

## 2021-07-04 DIAGNOSIS — I10 Essential (primary) hypertension: Secondary | ICD-10-CM

## 2021-07-04 NOTE — Assessment & Plan Note (Signed)
BP Readings from Last 3 Encounters:  07/04/21 138/86  01/12/21 (!) 152/86  01/02/21 138/78  Chronic condition well-controlled on amlodipine 10 mg daily, carvedilol 6.25 mg twice daily Continue current medications DASH diet advised engage in regular walking exercise as tolerated at least 150 minutes weekly CMP today Follow-up in 6 months

## 2021-07-04 NOTE — Assessment & Plan Note (Addendum)
Currently on atorvastatin 40 mg daily, Repatha 140 mg every 2 weeks injection Check lipid panel today Avoid fatty fried foods

## 2021-07-04 NOTE — Progress Notes (Signed)
   Alyssa Navarro     MRN: [id number]      DOB: 09-06-1950   HPI Alyssa Navarro with past medical history of hypertension, osteopenia, mixed hyperlipidemia, CAD is here for follow up and re-evaluation of chronic medical conditions, medication management.  Patient denies adverse reactions to current medications stated that cardiology has restarted her on Repatha injection.  Patient stated that she has been taking all her medications as prescribed.  Patient denies any specific complaints today Due for shingles vaccine, need for vaccine discussed with patient patient encouraged to get her vaccines at the pharmacy   ROS Denies recent fever or chills. Denies sinus pressure, nasal congestion, ear pain or sore throat. Denies chest congestion, productive cough or wheezing. Denies chest pains, palpitations and leg swelling Denies abdominal pain, nausea, vomiting,diarrhea or constipation.   Denies joint pain, swelling and limitation in mobility. Denies depression, anxiety or insomnia.    PE  BP 138/86 (BP Location: Right Arm, Patient Position: Sitting, Cuff Size: Normal)   Pulse 68   Ht 5' (1.524 m)   Wt 135 lb (61.2 kg)   SpO2 92%   BMI 26.37 kg/m   Patient alert and oriented and in no cardiopulmonary distress.   Chest: Clear to auscultation bilaterally.  CVS: S1, S2 no murmurs, no S3.Regular rate.  ABD: Soft non tender.   Ext: No edema  MS: Adequate ROM spine, shoulders, hips and knees.   Psych: Good eye contact, normal affect. Memory intact not anxious or depressed appearing.  CNS: CN 2-12 intact, power,  normal throughout.no focal deficits noted.   Assessment & Plan  HTN (hypertension) BP Readings from Last 3 Encounters:  07/04/21 138/86  01/12/21 (!) 152/86  01/02/21 138/78  Chronic condition well-controlled on amlodipine 10 mg daily, carvedilol 6.25 mg twice daily Continue current medications DASH diet advised engage in regular walking exercise as tolerated at  least 150 minutes weekly CMP today Follow-up in 6 months  Osteopenia Continue Fosamax 70 mg once weekly, Caltrate 600+ D3 1 tablets daily Last DEXA scan was in 2020 Check vitamin D levels  Mixed hyperlipidemia Currently on atorvastatin 40 mg daily, Repatha 140 mg every 2 weeks injection Check lipid panel today Avoid fatty fried foods  Vitamin D deficiency Has osteopenia Currently on Caltrate 600+ D3 1 tablet daily, Fosamax 70 mg once weekly Check vitamin D levels

## 2021-07-04 NOTE — Assessment & Plan Note (Signed)
Has osteopenia Currently on Caltrate 600+ D3 1 tablet daily, Fosamax 70 mg once weekly Check vitamin D levels

## 2021-07-04 NOTE — Patient Instructions (Signed)
Please get your shingles vaccine at your pharmacy     It is important that you exercise regularly at least 30 minutes 5 times a week.  Think about what you will eat, plan ahead. Choose " clean, green, fresh or frozen" over canned, processed or packaged foods which are more sugary, salty and fatty. 70 to 75% of food eaten should be vegetables and fruit. Three meals at set times with snacks allowed between meals, but they must be fruit or vegetables. Aim to eat over a 12 hour period , example 7 am to 7 pm, and STOP after  your last meal of the day. Drink water,generally about 64 ounces per day, no other drink is as healthy. Fruit juice is best enjoyed in a healthy way, by EATING the fruit.  Thanks for choosing [facility], we consider it a privelige to serve you.  

## 2021-07-04 NOTE — Assessment & Plan Note (Addendum)
Continue Fosamax 70 mg once weekly, Caltrate 600+ D3 1 tablets daily Last DEXA scan was in 2020 Check vitamin D levels

## 2021-07-05 ENCOUNTER — Other Ambulatory Visit: Payer: Self-pay | Admitting: Nurse Practitioner

## 2021-07-05 DIAGNOSIS — E875 Hyperkalemia: Secondary | ICD-10-CM

## 2021-07-05 LAB — CMP14+EGFR
ALT: 21 IU/L (ref 0–32)
AST: 26 IU/L (ref 0–40)
Albumin/Globulin Ratio: 1.7 (ref 1.2–2.2)
Albumin: 4.4 g/dL (ref 3.7–4.7)
Alkaline Phosphatase: 103 IU/L (ref 44–121)
BUN/Creatinine Ratio: 29 — ABNORMAL HIGH (ref 12–28)
BUN: 20 mg/dL (ref 8–27)
Bilirubin Total: 0.3 mg/dL (ref 0.0–1.2)
CO2: 26 mmol/L (ref 20–29)
Calcium: 10.8 mg/dL — ABNORMAL HIGH (ref 8.7–10.3)
Chloride: 104 mmol/L (ref 96–106)
Creatinine, Ser: 0.7 mg/dL (ref 0.57–1.00)
Globulin, Total: 2.6 g/dL (ref 1.5–4.5)
Glucose: 96 mg/dL (ref 70–99)
Potassium: 3.9 mmol/L (ref 3.5–5.2)
Sodium: 146 mmol/L — ABNORMAL HIGH (ref 134–144)
Total Protein: 7 g/dL (ref 6.0–8.5)
eGFR: 92 mL/min/{1.73_m2} (ref >59)

## 2021-07-05 LAB — LIPID PANEL
Chol/HDL Ratio: 2.5 ratio (ref 0.0–4.4)
Cholesterol, Total: 140 mg/dL (ref 100–199)
HDL: 56 mg/dL (ref >39)
LDL Chol Calc (NIH): 60 mg/dL (ref 0–99)
Triglycerides: 140 mg/dL (ref 0–149)
VLDL Cholesterol Cal: 24 mg/dL (ref 5–40)

## 2021-07-05 LAB — VITAMIN D 25 HYDROXY (VIT D DEFICIENCY, FRACTURES): Vit D, 25-Hydroxy: 100 ng/mL (ref 30.0–100.0)

## 2021-07-05 NOTE — Progress Notes (Signed)
Calcium level is elevated, sodium slightly elevated ,drink at least 64 ounces of water daily, avoid calcium supplements, .  Vitamin D D level is 100, patient should stop taking calcium with vitamin D at this time, she should continue to take Fosamax 70 mg once weekly.  Cholesterol labs is normal  will recheck calcium labs in 2 weeks

## 2021-07-19 DIAGNOSIS — E875 Hyperkalemia: Secondary | ICD-10-CM | POA: Diagnosis not present

## 2021-07-20 LAB — CALCIUM: Calcium: 10 mg/dL (ref 8.7–10.3)

## 2021-07-20 NOTE — Progress Notes (Signed)
Potassium level is back to normal.  Continue current medications

## 2021-08-06 ENCOUNTER — Encounter: Payer: Self-pay | Admitting: Family Medicine

## 2021-08-06 ENCOUNTER — Telehealth: Payer: Self-pay | Admitting: Family Medicine

## 2021-08-06 MED ORDER — CEPHALEXIN 500 MG PO CAPS: 500.0000 mg | ORAL_CAPSULE | Freq: Three times a day (TID) | ORAL | 0 refills | Status: DC

## 2021-08-06 NOTE — Telephone Encounter (Signed)
Kkeflex 500 mg twice saily for 1 week for presumed cellulitis

## 2021-10-16 ENCOUNTER — Other Ambulatory Visit: Payer: Self-pay | Admitting: Cardiovascular Disease

## 2021-11-05 ENCOUNTER — Other Ambulatory Visit: Payer: Self-pay

## 2021-11-05 ENCOUNTER — Other Ambulatory Visit: Payer: Self-pay | Admitting: Cardiovascular Disease

## 2021-11-05 DIAGNOSIS — Z23 Encounter for immunization: Secondary | ICD-10-CM | POA: Diagnosis not present

## 2021-11-05 MED ORDER — AMLODIPINE BESYLATE 10 MG PO TABS: ORAL_TABLET | ORAL | 0 refills | Status: DC

## 2022-01-01 ENCOUNTER — Other Ambulatory Visit: Payer: Self-pay | Admitting: Family Medicine

## 2022-01-02 ENCOUNTER — Other Ambulatory Visit: Payer: Self-pay | Admitting: Cardiovascular Disease

## 2022-01-03 ENCOUNTER — Encounter: Payer: Self-pay | Admitting: Internal Medicine

## 2022-01-03 ENCOUNTER — Ambulatory Visit (INDEPENDENT_AMBULATORY_CARE_PROVIDER_SITE_OTHER): Payer: Medicare Other | Admitting: Internal Medicine

## 2022-01-03 ENCOUNTER — Ambulatory Visit: Payer: Medicare Other | Admitting: Nurse Practitioner

## 2022-01-03 VITALS — BP 134/72 | HR 77 | Ht 61.0 in | Wt 128.8 lb

## 2022-01-03 DIAGNOSIS — I2510 Atherosclerotic heart disease of native coronary artery without angina pectoris: Secondary | ICD-10-CM | POA: Diagnosis not present

## 2022-01-03 DIAGNOSIS — E782 Mixed hyperlipidemia: Secondary | ICD-10-CM | POA: Diagnosis not present

## 2022-01-03 DIAGNOSIS — I10 Essential (primary) hypertension: Secondary | ICD-10-CM | POA: Diagnosis not present

## 2022-01-03 DIAGNOSIS — M8580 Other specified disorders of bone density and structure, unspecified site: Secondary | ICD-10-CM

## 2022-01-03 DIAGNOSIS — E559 Vitamin D deficiency, unspecified: Secondary | ICD-10-CM

## 2022-01-03 DIAGNOSIS — Z1159 Encounter for screening for other viral diseases: Secondary | ICD-10-CM

## 2022-01-03 DIAGNOSIS — Z0001 Encounter for general adult medical examination with abnormal findings: Secondary | ICD-10-CM

## 2022-01-03 DIAGNOSIS — R799 Abnormal finding of blood chemistry, unspecified: Secondary | ICD-10-CM

## 2022-01-03 NOTE — Assessment & Plan Note (Signed)
Lipid panel updated in May.  She is currently prescribed atorvastatin 40 mg daily and Repatha.  This is managed by cardiology.  Repeat lipid panel ordered today.  She has cardiology follow-up scheduled for early January.

## 2022-01-03 NOTE — Assessment & Plan Note (Signed)
One time HCV screening ordered today

## 2022-01-03 NOTE — Patient Instructions (Signed)
It was a pleasure to see you today.  Thank you for giving [place] the opportunity to be involved in your care.  Below is a brief recap of your visit and next steps.  We will plan to see you again in 6 months.  Summary No medication changes today. We will repeat labs today and plan for follow up in 6 months.

## 2022-01-03 NOTE — Assessment & Plan Note (Signed)
Currently prescribed Fosamax 70 mg weekly in addition to calcium-vitamin D supplementation.  Fosamax was started in 2022.  Consider stopping after 5 years of therapy.  No changes today.

## 2022-01-03 NOTE — Assessment & Plan Note (Signed)
She is currently prescribed amlodipine 10 mg daily and carvedilol 6.25 mg twice daily.  Her blood pressure today is 134/72.  No medication changes today.

## 2022-01-03 NOTE — Progress Notes (Signed)
Established Patient Office Visit  Subjective   Patient ID: Alyssa Navarro, female    DOB: April 20, 1950  Age: 71 y.o. MRN: [id number]  Chief Complaint  Patient presents with   Follow-up   Alyssa Navarro returns to care today.  She is a 71 year old woman, who was last seen on 5/31 by Vena Rua, NP.  No medication changes were made at that time.  There have been no acute interval events.  Today Alyssa Navarro reports feeling well she states that she may have had RSV in August had a brief GI illness in June.  She does not have any acute concerns to discuss today and is asymptomatic.  Chronic medical conditions and outstanding preventative care items reviewed today are individually addressed in A/P below.  Past Medical History:  Diagnosis Date   Allergy    Anemia    Arthritis    Coronary artery disease    Hyperlipidemia    Hypertension    Screening for malignant neoplasm of the cervix 06/23/2013   Trichimoniasis 07/19/2013   Past Surgical History:  Procedure Laterality Date   2D Echocardiogram  12/22/2007   EF >55%, normal.   ABDOMINAL HYSTERECTOMY     ANKLE FRACTURE SURGERY Left 02/17/2020   APPENDECTOMY     ARTHROSCOPY WITH ANTERIOR CRUCIATE LIGAMENT (ACL) REPAIR WITH ANTERIOR TIBILIAS GRAFT Right 2007   BUNIONECTOMY Bilateral    CARDIAC CATHETERIZATION  02/12/1991   CAD demonstrated in Proximal LAD-50% stenosis, Distal LAD-60%, Mid Circumflex-60% stenosis, Marginal Circumflex-80% stenosis, Proximal RCA-70% stenosis   CARDIAC CATHETERIZATION  11/12/1993   Mild-moderate 3-vessel disease-50% mid LAD, 75% mid circumflex, 80% ostial portion L circumflex.   CARDIAC CATHETERIZATION  12/03/1996   95% mid circumflex stenosis stented with a 3.5x44m Sci-Med NIR, resulting in reduciton to 0%. 75% distal circumflex stenosis stented with a 3.0x148mNIR stent resulting in reduciton to 0%.   CARDIAC CATHETERIZATION  08/23/2003   Continued medical therapy. Patent L circumflex stents placed in 1998.    CHOLECYSTECTOMY     COLONOSCOPY N/A 12/21/2014   Procedure: COLONOSCOPY;  Surgeon: NaRogene HoustonMD;  [facility];  Service: Endoscopy;  Laterality: N/A;  [place]11/17/2009   No ECG changes, nondiagnostic EKG. Perfusion defect in inferior myocardial region consistent with diaphragmatic attenuation.   ORIF ANKLE FRACTURE Left 02/17/2020   Procedure: OPEN REDUCTION INTERNAL FIXATION (ORIF) ANKLE FRACTURE;  Surgeon: CaMordecai RasmussenMD;  Location: [facility];  Service: Orthopedics;  Laterality: Left;   TUBAL LIGATION     Social History   Tobacco Use   Smoking status: Never   Smokeless tobacco: Never  Vaping Use   Vaping Use: Never used  Substance Use Topics   Alcohol use: No   Drug use: No   Family History  Problem Relation Age of Onset   Diabetes Mother    Hypertension Mother    Hyperlipidemia Mother    Heart failure Other    Hypertension Other    Diabetes Other    Hyperlipidemia Father    Heart disease Sister    Cancer Maternal Aunt        breast   Breast cancer Maternal Aunt    Cancer Maternal Grandmother        breast   Diabetes Maternal Grandmother    Hypertension Paternal Grandmother    Heart failure Paternal Grandmother    Breast cancer Paternal Grandmother    Fibroids Sister    Allergies  Allergen Reactions  Avelox [Moxifloxacin Hcl In Nacl]    Quinolones     Hallucinations, sleeplessness, rash   Statins     Myalgias (high doses only)   Latex Rash   Metoprolol Rash   Sulfa Antibiotics Rash   Review of Systems  Constitutional:  Negative for chills and fever.  HENT:  Negative for sore throat.   Respiratory:  Negative for cough and shortness of breath.   Cardiovascular:  Negative for chest pain, palpitations and leg swelling.  Gastrointestinal:  Negative for abdominal pain, blood in stool, constipation, diarrhea, nausea and vomiting.  Genitourinary:  Negative for dysuria and hematuria.  Musculoskeletal:  Negative for myalgias.   Skin:  Negative for itching and rash.  Neurological:  Negative for dizziness and headaches.  Psychiatric/Behavioral:  Negative for depression and suicidal ideas.      Objective:     BP 134/72   Pulse 77   Ht _0  (1.549 m)   Wt 128 lb 12.8 oz (58.4 kg)   SpO2 91%   BMI 24.34 kg/m  BP Readings from Last 3 Encounters:  01/03/22 134/72  07/04/21 138/86  01/12/21 (!) 152/86   Physical Exam Vitals reviewed.  Constitutional:      General: She is not in acute distress.    Appearance: Normal appearance. She is not toxic-appearing.  HENT:     Head: Normocephalic and atraumatic.     Right Ear: External ear normal.     Left Ear: External ear normal.     Nose: Nose normal. No congestion or rhinorrhea.     Mouth/Throat:     Mouth: Mucous membranes are moist.     Pharynx: Oropharynx is clear. No oropharyngeal exudate or posterior oropharyngeal erythema.  Eyes:     General: No scleral icterus.    Extraocular Movements: Extraocular movements intact.     Conjunctiva/sclera: Conjunctivae normal.     Pupils: Pupils are equal, round, and reactive to light.  Cardiovascular:     Rate and Rhythm: Normal rate and regular rhythm.     Pulses: Normal pulses.     Heart sounds: Normal heart sounds. No murmur heard.    No friction rub. No gallop.  Pulmonary:     Effort: Pulmonary effort is normal.     Breath sounds: Normal breath sounds. No wheezing, rhonchi or rales.  Abdominal:     General: Abdomen is flat. Bowel sounds are normal. There is no distension.     Palpations: Abdomen is soft.     Tenderness: There is no abdominal tenderness.  Musculoskeletal:        General: No swelling. Normal range of motion.     Cervical back: Normal range of motion.     Right lower leg: No edema.     Left lower leg: No edema.  Lymphadenopathy:     Cervical: No cervical adenopathy.  Skin:    General: Skin is warm and dry.     Capillary Refill: Capillary refill takes less than 2 seconds.      Coloration: Skin is not jaundiced.  Neurological:     General: No focal deficit present.     Mental Status: She is alert and oriented to person, place, and time.  Psychiatric:        Mood and Affect: Mood normal.        Behavior: Behavior normal.    Last CBC Lab Results  Component Value Date   WBC 8.4 07/06/2020   HGB 13.2 07/06/2020   HCT 41.4 07/06/2020  MCV 89 07/06/2020   MCH 28.5 07/06/2020   RDW 14.2 07/06/2020   PLT 227 36/72/5500   Last metabolic panel Lab Results  Component Value Date   GLUCOSE 96 07/04/2021   NA 146 (H) 07/04/2021   K 3.9 07/04/2021   CL 104 07/04/2021   CO2 26 07/04/2021   BUN 20 07/04/2021   CREATININE 0.70 07/04/2021   EGFR 92 07/04/2021   CALCIUM 10.0 07/19/2021   PROT 7.0 07/04/2021   ALBUMIN 4.4 07/04/2021   LABGLOB 2.6 07/04/2021   AGRATIO 1.7 07/04/2021   BILITOT 0.3 07/04/2021   ALKPHOS 103 07/04/2021   AST 26 07/04/2021   ALT 21 07/04/2021   ANIONGAP 10 02/15/2020   Last lipids Lab Results  Component Value Date   CHOL 140 07/04/2021   HDL 56 07/04/2021   LDLCALC 60 07/04/2021   TRIG 140 07/04/2021   CHOLHDL 2.5 07/04/2021   Last thyroid functions Lab Results  Component Value Date   TSH 0.784 07/06/2020   Last vitamin D Lab Results  Component Value Date   VD25OH 100.0 07/04/2021   The 10-year ASCVD risk score (Arnett DK, et al., 2019) is: 11%    Assessment & Plan:   Problem List Items Addressed This Visit       HTN (hypertension)    She is currently prescribed amlodipine 10 mg daily and carvedilol 6.25 mg twice daily.  Her blood pressure today is 134/72.  No medication changes today.      Osteopenia    Currently prescribed Fosamax 70 mg weekly in addition to calcium-vitamin D supplementation.  Fosamax was started in 2022.  Consider stopping after 5 years of therapy.  No changes today.      Mixed hyperlipidemia    Lipid panel updated in May.  She is currently prescribed atorvastatin 40 mg daily and  Repatha.  This is managed by cardiology.  Repeat lipid panel ordered today.  She has cardiology follow-up scheduled for early January.      Encounter for HCV screening test for low risk patient    One time HCV screening ordered today      Return in about 6 months (around 07/04/2022).    Johnette Abraham, MD

## 2022-01-04 LAB — B12 AND FOLATE PANEL
Folate: 19.8 ng/mL (ref >3.0)
Vitamin B-12: 959 pg/mL (ref 232–1245)

## 2022-01-04 LAB — CMP14+EGFR
ALT: 27 IU/L (ref 0–32)
AST: 22 IU/L (ref 0–40)
Albumin/Globulin Ratio: 2 (ref 1.2–2.2)
Albumin: 4.7 g/dL (ref 3.8–4.8)
Alkaline Phosphatase: 97 IU/L (ref 44–121)
BUN/Creatinine Ratio: 16 (ref 12–28)
BUN: 10 mg/dL (ref 8–27)
Bilirubin Total: 0.4 mg/dL (ref 0.0–1.2)
CO2: 27 mmol/L (ref 20–29)
Calcium: 10.2 mg/dL (ref 8.7–10.3)
Chloride: 103 mmol/L (ref 96–106)
Creatinine, Ser: 0.64 mg/dL (ref 0.57–1.00)
Globulin, Total: 2.4 g/dL (ref 1.5–4.5)
Glucose: 91 mg/dL (ref 70–99)
Potassium: 3.7 mmol/L (ref 3.5–5.2)
Sodium: 144 mmol/L (ref 134–144)
Total Protein: 7.1 g/dL (ref 6.0–8.5)
eGFR: 93 mL/min/{1.73_m2} (ref >59)

## 2022-01-04 LAB — CBC WITH DIFFERENTIAL/PLATELET
Basophils Absolute: 0 10*3/uL (ref 0.0–0.2)
Basos: 0 %
EOS (ABSOLUTE): 0.1 10*3/uL (ref 0.0–0.4)
Eos: 2 %
Hematocrit: 38.9 % (ref 34.0–46.6)
Hemoglobin: 12.8 g/dL (ref 11.1–15.9)
Immature Grans (Abs): 0 10*3/uL (ref 0.0–0.1)
Immature Granulocytes: 0 %
Lymphocytes Absolute: 1.8 10*3/uL (ref 0.7–3.1)
Lymphs: 27 %
MCH: 29.8 pg (ref 26.6–33.0)
MCHC: 32.9 g/dL (ref 31.5–35.7)
MCV: 91 fL (ref 79–97)
Monocytes Absolute: 0.3 10*3/uL (ref 0.1–0.9)
Monocytes: 5 %
Neutrophils Absolute: 4.5 10*3/uL (ref 1.4–7.0)
Neutrophils: 66 %
Platelets: 198 10*3/uL (ref 150–450)
RBC: 4.29 x10E6/uL (ref 3.77–5.28)
RDW: 13.7 % (ref 11.7–15.4)
WBC: 6.8 10*3/uL (ref 3.4–10.8)

## 2022-01-04 LAB — LIPID PANEL
Chol/HDL Ratio: 1.9 ratio (ref 0.0–4.4)
Cholesterol, Total: 136 mg/dL (ref 100–199)
HDL: 72 mg/dL (ref >39)
LDL Chol Calc (NIH): 47 mg/dL (ref 0–99)
Triglycerides: 91 mg/dL (ref 0–149)
VLDL Cholesterol Cal: 17 mg/dL (ref 5–40)

## 2022-01-04 LAB — VITAMIN D 25 HYDROXY (VIT D DEFICIENCY, FRACTURES): Vit D, 25-Hydroxy: 85.4 ng/mL (ref 30.0–100.0)

## 2022-01-04 LAB — TSH+FREE T4
Free T4: 1.4 ng/dL (ref 0.82–1.77)
TSH: 1.36 u[IU]/mL (ref 0.450–4.500)

## 2022-01-04 LAB — HCV INTERPRETATION

## 2022-01-04 LAB — HCV AB W REFLEX TO QUANT PCR: HCV Ab: NONREACTIVE

## 2022-02-01 ENCOUNTER — Other Ambulatory Visit: Payer: Self-pay | Admitting: Cardiovascular Disease

## 2022-02-07 ENCOUNTER — Telehealth: Payer: Self-pay | Admitting: Cardiovascular Disease

## 2022-02-07 ENCOUNTER — Ambulatory Visit: Payer: Medicare Other | Attending: Cardiovascular Disease | Admitting: Cardiovascular Disease

## 2022-02-07 ENCOUNTER — Encounter: Payer: Self-pay | Admitting: Cardiovascular Disease

## 2022-02-07 VITALS — BP 132/81 | HR 70 | Ht 61.0 in | Wt 126.4 lb

## 2022-02-07 DIAGNOSIS — E782 Mixed hyperlipidemia: Secondary | ICD-10-CM | POA: Insufficient documentation

## 2022-02-07 DIAGNOSIS — I2510 Atherosclerotic heart disease of native coronary artery without angina pectoris: Secondary | ICD-10-CM | POA: Insufficient documentation

## 2022-02-07 DIAGNOSIS — E7801 Familial hypercholesterolemia: Secondary | ICD-10-CM | POA: Insufficient documentation

## 2022-02-07 DIAGNOSIS — I10 Essential (primary) hypertension: Secondary | ICD-10-CM | POA: Diagnosis not present

## 2022-02-07 NOTE — Progress Notes (Signed)
Cardiology Office Note:    Date:  02/09/2022    ID:  Alyssa Navarro, DOB 03-26-1950, MRN [id number]  PCP:  Johnette Abraham, MD  [facility] Cardiologist:  Sanda Klein, MD  [facility] Electrophysiologist:  None   Referring MD: Johnette Abraham, MD   CC: CAD follow up   History of Present Illness:    Alyssa Navarro is a 72 y.o. female with a hx of HTN on Coreg and Amlodipine, CAD with stent placement in 1998, HLD who presents today for follow up.  The patient specifically denies any chest pain at rest exertion, dyspnea at rest or with exertion, orthopnea, paroxysmal nocturnal dyspnea, syncope, palpitations, focal neurological deficits, intermittent claudication, lower extremity edema, unexplained weight gain, cough, hemoptysis or wheezing.  She had problems with diarrhea in June and RSV infection in August.  She has trouble with a high cost of the [place].  When she was on statin monotherapy her LDL cholesterol was too high.  Similarly on Repatha monotherapy her cholesterol was still too high she clearly needs both medications.  Most recent lipid profile performed last month shows LDL cholesterol 47, HDL 72.  Her baseline LDL is around 260, when not taking any medications.  As usual, her blood pressure was little high when she first checked in but settled down when she has a chance to relax for about 10 minutes.  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 She required percutaneous revascularization in 1998 (3.5x25 NIR to mid dominant left circumflex artery, 3.0x16 NIR to distal LCX, 50-60% mid LAD untreated), but no further procedures since that time.  Past Medical History:  Diagnosis Date   Allergy    Anemia    Arthritis    Coronary artery disease    Hyperlipidemia    Hypertension    Screening for malignant neoplasm of the cervix 06/23/2013   Trichimoniasis 07/19/2013     Past Surgical History:  Procedure Laterality Date   2D Echocardiogram  12/22/2007   EF >55%, normal.   ABDOMINAL HYSTERECTOMY     ANKLE FRACTURE SURGERY Left 02/17/2020   APPENDECTOMY     ARTHROSCOPY WITH ANTERIOR CRUCIATE LIGAMENT (ACL) REPAIR WITH ANTERIOR TIBILIAS GRAFT Right 2007   BUNIONECTOMY Bilateral    CARDIAC CATHETERIZATION  02/12/1991   CAD demonstrated in Proximal LAD-50% stenosis, Distal LAD-60%, Mid Circumflex-60% stenosis, Marginal Circumflex-80% stenosis, Proximal RCA-70% stenosis   CARDIAC CATHETERIZATION  11/12/1993   Mild-moderate 3-vessel disease-50% mid LAD, 75% mid circumflex, 80% ostial portion L circumflex.   CARDIAC CATHETERIZATION  12/03/1996   95% mid circumflex stenosis stented with a 3.5x7m Sci-Med NIR, resulting in reduciton to 0%. 75% distal circumflex stenosis stented with a 3.0x134mNIR stent resulting in reduciton to 0%.   CARDIAC CATHETERIZATION  08/23/2003   Continued medical therapy. Patent L circumflex stents placed in 1998.   CHOLECYSTECTOMY     COLONOSCOPY N/A 12/21/2014   Procedure: COLONOSCOPY;  Surgeon: NaRogene HoustonMD;  [facility];  Service: Endoscopy;  Laterality: N/A;  83[place]11/17/2009   No ECG changes, nondiagnostic EKG. Perfusion defect in inferior myocardial region consistent with diaphragmatic attenuation.   ORIF ANKLE FRACTURE Left 02/17/2020   Procedure: OPEN REDUCTION INTERNAL FIXATION (ORIF) ANKLE FRACTURE;  Surgeon: CaMordecai RasmussenMD;  Location: [facility];  Service: Orthopedics;  Laterality: Left;   TUBAL LIGATION  Current Medications: Current Meds  Medication Sig   Acetaminophen (TYLENOL 8 HOUR PO) Take 1 tablet by mouth as needed.   alendronate (FOSAMAX) 70 MG tablet TAKE 1 TABLET(70 MG) BY MOUTH EVERY 7 DAYS WITH A FULL GLASS OF WATER AND ON AN EMPTY STOMACH   amLODipine (NORVASC) 10 MG tablet TAKE 1 TABLET(10 MG) BY MOUTH DAILY   atorvastatin (LIPITOR) 40 MG tablet TAKE 1 TABLET(40 MG)  BY MOUTH DAILY   carvedilol (COREG) 6.25 MG tablet TAKE 1 TABLET BY MOUTH TWICE DAILY WITH A MEAL, SCHEDULE OFFICE VISIT FOR FUTURE REFILLS   Coenzyme Q10 (CO Q10) 100 MG CAPS Take 200 mg by mouth daily.    Multiple Vitamins-Minerals (CENTRUM SILVER PO) Take 1 tablet by mouth daily.   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[employer] Use   Smoking status: Never   Smokeless tobacco: Never  Vaping Use   Vaping Use: Never used  Substance and Sexual Activity   Alcohol use: No   Drug use: No   Sexual activity: Not Currently    Birth control/protection: Surgical  Other Topics Concern   Not on file  Social History Narrative   Not on file   Social Determinants of Health   Financial Resource Strain: Low Risk  (02/22/2021)   Overall Financial Resource Strain (CARDIA)    Difficulty of Paying Living Expenses: Not hard at all  Food Insecurity: No Food Insecurity (02/22/2021)   Hunger Vital Sign    Worried About Running Out of Food in the Last Year: Never true    [place] in the Last Year: Never true  Transportation Needs: No Transportation Needs (02/22/2021)   PRAPARE - Hydrologist (Medical): No    Lack of Transportation (Non-Medical): No  Physical Activity: Insufficiently Active (02/22/2020)   Exercise Vital Sign    Days of Exercise per Week: 3 days    Minutes of Exercise per Session: 30 min  Stress: No Stress Concern Present (02/22/2021)   [place]    Feeling of Stress : Not at all  Social Connections: Moderately Integrated (02/22/2021)   Social Connection and Isolation Panel [NHANES]    Frequency of Communication with Friends and Family: More than three times a week    Frequency of Social Gatherings with Friends and Family: More than three times a week    Attends Religious Services: More than 4 times per year    Active Member of [employer] or Organizations: Yes    Attends Music therapist: More than 4 times per year    Marital Status: Divorced     Family History: The patient's family history includes Breast cancer in her maternal aunt and paternal grandmother; Cancer in her maternal aunt and maternal grandmother; Diabetes in her maternal grandmother, mother, and another family member; Fibroids in her sister; Heart disease in her sister; Heart failure in her paternal grandmother and another family member; Hyperlipidemia in  her father and mother; Hypertension in her mother, paternal grandmother, and another family member.  ROS:   Please see the history of present illness.    All other systems reviewed and are negative.  EKGs/Labs/Other Studies Reviewed:    EKG:  EKG ordered today is personally reviewed. NSR, nonspecific ST changes and is very similar to previous tracings.  QTc 442 ms.  Recent Labs: 01/03/2022: ALT 27; BUN 10; Creatinine, Ser 0.64; Hemoglobin 12.8; Platelets 198; Potassium 3.7; Sodium 144; TSH 1.360  Recent Lipid Panel    Component Value Date/Time   CHOL 136 01/03/2022 0911   CHOL 246 08/21/2018 0910   TRIG 91 01/03/2022 0911   TRIG 211 (A) 08/21/2018 0910   HDL 72 01/03/2022 0911   HDL 58 08/21/2018 0910   CHOLHDL 1.9 01/03/2022 0911   CHOLHDL 4.0 03/21/2017 0743   VLDL 46 (H) 09/22/2015 0835   LDLCALC 47 01/03/2022 0911    Physical Exam:    VS:  BP 132/81   Pulse 70   Ht '5\' 1"'$  (1.549 m)   Wt 126 lb 6.4 oz (57.3 kg)   SpO2 98%   BMI 23.88 kg/m     Wt  Readings from Last 3 Encounters:  02/07/22 126 lb 6.4 oz (57.3 kg)  01/03/22 128 lb 12.8 oz (58.4 kg)  07/04/21 135 lb (61.2 kg)      General: Alert, oriented x3, no distress, appears younger than stated age Head: no evidence of trauma, PERRL, EOMI, no exophtalmos or lid lag, no myxedema, no xanthelasma; normal ears, nose and oropharynx Neck: normal jugular venous pulsations and no hepatojugular reflux; brisk carotid pulses without delay and faint right carotid bruit Chest: clear to auscultation, no signs of consolidation by percussion or palpation, normal fremitus, symmetrical and full respiratory excursions Cardiovascular: normal position and quality of the apical impulse, regular rhythm, normal first and second heart sounds, no murmurs, rubs or gallops Abdomen: no tenderness or distention, no masses by palpation, no abnormal pulsatility or arterial bruits, normal bowel sounds, no hepatosplenomegaly Extremities: no clubbing, cyanosis or edema; 2+ radial, ulnar and brachial pulses bilaterally; 2+ right femoral, posterior tibial and dorsalis pedis pulses; 2+ left femoral, posterior tibial and dorsalis pedis pulses; no subclavian or femoral bruits Neurological: grossly nonfocal Psych: Normal mood and affect    ASSESSMENT:    1. Coronary artery disease involving native coronary artery of native heart without angina pectoris   2. Essential hypertension   3. Mixed hyperlipidemia   4. Heterozygous familial hypercholesterolemia      PLAN:    In order of problems listed above:  CAD: Asymptomatic.  Continue lipid lowering meds,  beta-blocker and low-dose aspirin. HTN: controlled. HLP: for control needs combination statin and Repatha. Probably has familial heterozygous hypercholesterolemia Right carotid bruit: No obstructive lesions, just mild plaque on ultrasound.  Medication Adjustments/Labs and Tests Ordered: Current medicines are reviewed at length with the patient today.  Concerns  regarding medicines are outlined above.  Orders Placed This Encounter  Procedures   EKG 12-Lead    No orders of the defined types were placed in this encounter.    Patient Instructions  Medication Instructions:  CONTINUE CURRENT MEDICATIONS  *If you need a refill on your cardiac medications before your next appointment, please call your pharmacy*    Follow-Up: At [facility], you and your health needs are our priority.  As part of our continuing mission to provide you with exceptional heart care, we have created designated Provider Care Teams.  These Care Teams include your primary Cardiologist (physician) and Advanced Practice Providers (APPs -  Physician Assistants and Nurse Practitioners) who all work together to provide you with the care you need, when you need it.  We recommend signing up for the patient portal called "MyChart".  Sign up information is provided on this After Visit Summary.  MyChart is used to connect with patients for Virtual Visits (Telemedicine).  Patients are able to view lab/test results, encounter notes, upcoming appointments, etc.  Non-urgent messages can be sent to your provider as well.   To learn more about what you can do with MyChart, go to [email or web address].     12 months with Dr. Sallyanne Kuster        Sign Sanda Klein, MD, [facility] [phone] 02/09/2022, 3:19 PM

## 2022-02-07 NOTE — Patient Instructions (Signed)
Medication Instructions:  CONTINUE CURRENT MEDICATIONS  *If you need a refill on your cardiac medications before your next appointment, please call your pharmacy*    Follow-Up: At [facility], you and your health needs are our priority.  As part of our continuing mission to provide you with exceptional heart care, we have created designated Provider Care Teams.  These Care Teams include your primary Cardiologist (physician) and Advanced Practice Providers (APPs -  Physician Assistants and Nurse Practitioners) who all work together to provide you with the care you need, when you need it.  We recommend signing up for the patient portal called "MyChart".  Sign up information is provided on this After Visit Summary.  MyChart is used to connect with patients for Virtual Visits (Telemedicine).  Patients are able to view lab/test results, encounter notes, upcoming appointments, etc.  Non-urgent messages can be sent to your provider as well.   To learn more about what you can do with MyChart, go to [email or web address].     12 months with Dr. Sallyanne Kuster

## 2022-02-07 NOTE — Telephone Encounter (Signed)
Patient of Dr. Sallyanne Kuster, seen in the office 02/07/22. She expressed that Repatha was costing her >$200 some fills. Reviewed chart and she has good insurance coverage for [employer] (plan F) of which MD is OK with a med change is patient preferred that. Her cost may be $0 or $226 (calendar year deductible). Discussed with patient who prefers to remain on Repatha as it is working well for her. Advised we can enroll her for a [employer]. Explained that this grant is based on monetary donations and re-enrollment funds can open/close at various times. She is OK with this.   Approval info given to patient and documented below:   HEALTHWELL ID [id number]   PATIENT Alyssa Navarro   STATUS Approved   START DATE 01/08/2022   END DATE 01/08/2023   ASSISTANCE TYPE Co-pay   GRANT BALANCE $2500.00  Pharmacy Card CARD NO. [id number]   CARD STATUS Active   BIN [id number]   PCN [id number]   PC GROUP [id number]   PROVIDER PDMI   PROCESSOR PDMI

## 2022-02-09 ENCOUNTER — Encounter: Payer: Self-pay | Admitting: Cardiovascular Disease

## 2022-02-19 ENCOUNTER — Encounter: Payer: Self-pay | Admitting: Cardiovascular Disease

## 2022-02-25 ENCOUNTER — Ambulatory Visit (INDEPENDENT_AMBULATORY_CARE_PROVIDER_SITE_OTHER): Payer: Medicare Other

## 2022-02-25 VITALS — BP 146/85 | HR 80 | Ht 60.0 in | Wt 125.0 lb

## 2022-02-25 DIAGNOSIS — Z0000 Encounter for general adult medical examination without abnormal findings: Secondary | ICD-10-CM | POA: Diagnosis not present

## 2022-02-25 NOTE — Patient Instructions (Signed)
  Ms. Hegna , Thank you for taking time to come for your Medicare Wellness Visit. I appreciate your ongoing commitment to your health goals. Please review the following plan we discussed and let me know if I can assist you in the future.   These are the goals we discussed:  Goals      Protect My Health        This is a list of the screening recommended for you and due dates:  Health Maintenance  Topic Date Due   COVID-19 Vaccine (6 - 2023-24 season) 10/05/2021   Zoster (Shingles) Vaccine (2 of 2) 04/04/2022*   Mammogram  08/31/2022   DTaP/Tdap/Td vaccine (2 - Td or Tdap) 09/11/2022   Medicare Annual Wellness Visit  02/26/2023   Colon Cancer Screening  12/20/2024   Pneumonia Vaccine  Completed   Flu Shot  Completed   DEXA scan (bone density measurement)  Completed   Hepatitis C Screening: USPSTF Recommendation to screen - Ages 48-79 yo.  Completed   HPV Vaccine  Aged Out  *Topic was postponed. The date shown is not the original due date.

## 2022-02-25 NOTE — Progress Notes (Signed)
Subjective:   Alyssa Navarro is a 72 y.o. female who presents for Medicare Annual (Subsequent) preventive examination.  Review of Systems           Objective:    There were no vitals filed for this visit. There is no height or weight on file to calculate BMI.     02/22/2021   11:11 AM 02/22/2020   10:09 AM 02/17/2020    6:28 AM 02/15/2020    1:42 PM 02/07/2020   12:36 PM 12/21/2014    7:36 AM  Advanced Directives  Does Patient Have a Medical Advance Directive? No Yes No No No Yes  Type of Advance Directive  Living will    [place];Living will  Does patient want to make changes to medical advance directive? No - Patient declined No - Patient declined      Copy of [place] in Chart?      No - copy requested  Would patient like information on creating a medical advance directive? No - Patient declined No - Patient declined No - Patient declined No - Patient declined      Current Medications (verified) Outpatient Encounter Medications as of 02/25/2022  Medication Sig   Acetaminophen (TYLENOL 8 HOUR PO) Take 1 tablet by mouth as needed.   alendronate (FOSAMAX) 70 MG tablet TAKE 1 TABLET(70 MG) BY MOUTH EVERY 7 DAYS WITH A FULL GLASS OF WATER AND ON AN EMPTY STOMACH   amLODipine (NORVASC) 10 MG tablet TAKE 1 TABLET(10 MG) BY MOUTH DAILY   atorvastatin (LIPITOR) 40 MG tablet TAKE 1 TABLET(40 MG) BY MOUTH DAILY   carvedilol (COREG) 6.25 MG tablet TAKE 1 TABLET BY MOUTH TWICE DAILY WITH A MEAL, SCHEDULE OFFICE VISIT FOR FUTURE REFILLS   clobetasol ointment (TEMOVATE) 0.98 % Apply 1 application topically daily as needed for rash. (Patient not taking: Reported on 02/07/2022)   Coenzyme Q10 (CO Q10) 100 MG CAPS Take 200 mg by mouth daily.    Multiple Vitamins-Minerals (CENTRUM SILVER PO) Take 1 tablet by mouth daily.   REPATHA SURECLICK 119 MG/ML SOAJ ADMINISTER 1 ML UNDER THE SKIN EVERY 14 DAYS   No facility-administered encounter medications on file  as of 02/25/2022.    Allergies (verified) Avelox [moxifloxacin hcl in nacl], Quinolones, Statins, Latex, Metoprolol, and Sulfa antibiotics   History: Past Medical History:  Diagnosis Date   Allergy    Anemia    Arthritis    Coronary artery disease    Hyperlipidemia    Hypertension    Screening for malignant neoplasm of the cervix 06/23/2013   Trichimoniasis 07/19/2013   Past Surgical History:  Procedure Laterality Date   2D Echocardiogram  12/22/2007   EF >55%, normal.   ABDOMINAL HYSTERECTOMY     ANKLE FRACTURE SURGERY Left 02/17/2020   APPENDECTOMY     ARTHROSCOPY WITH ANTERIOR CRUCIATE LIGAMENT (ACL) REPAIR WITH ANTERIOR TIBILIAS GRAFT Right 2007   BUNIONECTOMY Bilateral    CARDIAC CATHETERIZATION  02/12/1991   CAD demonstrated in Proximal LAD-50% stenosis, Distal LAD-60%, Mid Circumflex-60% stenosis, Marginal Circumflex-80% stenosis, Proximal RCA-70% stenosis   CARDIAC CATHETERIZATION  11/12/1993   Mild-moderate 3-vessel disease-50% mid LAD, 75% mid circumflex, 80% ostial portion L circumflex.   CARDIAC CATHETERIZATION  12/03/1996   95% mid circumflex stenosis stented with a 3.5x74m Sci-Med NIR, resulting in reduciton to 0%. 75% distal circumflex stenosis stented with a 3.0x110mNIR stent resulting in reduciton to 0%.   CARDIAC CATHETERIZATION  08/23/2003   Continued  medical therapy. Patent L circumflex stents placed in 1998.   CHOLECYSTECTOMY     COLONOSCOPY N/A 12/21/2014   Procedure: COLONOSCOPY;  Surgeon: Rogene Houston, MD;  Location: [facility];  Service: Endoscopy;  Laterality: N/A;  [place]  12/22/2007   No ECG changes, nondiagnostic EKG. Perfusion defect in inferior myocardial region consistent with diaphragmatic attenuation.   ORIF ANKLE FRACTURE Left 02/17/2020   Procedure: OPEN REDUCTION INTERNAL FIXATION (ORIF) ANKLE FRACTURE;  Surgeon: [name], MD;  Location: [facility];  Service: Orthopedics;  Laterality: Left;   TUBAL LIGATION     Family  History  Problem Relation Age of Onset   Diabetes Mother    Hypertension Mother    Hyperlipidemia Mother    Heart failure Other    Hypertension Other    Diabetes Other    Hyperlipidemia Father    Heart disease Sister    Cancer Maternal Aunt        breast   Breast cancer Maternal Aunt    Cancer Maternal Grandmother        breast   Diabetes Maternal Grandmother    Hypertension Paternal Grandmother    Heart failure Paternal Grandmother    Breast cancer Paternal Grandmother    Fibroids Sister    Social History   Socioeconomic History   Marital status: Divorced    Spouse name: Not on file   Number of children: 2   Years of education: 12   Highest education level: Associate degree: occupational, Hotel manager, or vocational program  Occupational History   Occupation: retired    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[employer], technical [employer]  Tobacco Use   Smoking status: Never   Smokeless tobacco: Never  Vaping Use   Vaping Use: Never used  Substance and Sexual Activity   Alcohol use: No   Drug use: No   Sexual activity: Not Currently    Birth control/protection: Surgical  Other Topics Concern   Not on file  Social History Narrative   Not on file   Social Determinants of Health   Financial Resource Strain: Low Risk  (02/22/2021)   Overall Financial Resource Strain (CARDIA)    Difficulty of Paying Living Expenses: Not hard at all  Food Insecurity: No Food Insecurity (02/22/2021)   Hunger Vital Sign    Worried About Running Out of Food in the Last Year: Never true    Ran Out of Food in the Last Year: Never true  Transportation Needs: No Transportation Needs (02/22/2021)   PRAPARE - Hydrologist (Medical): No    Lack of Transportation (Non-Medical): No  Physical Activity: Insufficiently Active (02/22/2020)   Exercise Vital Sign    Days of Exercise per Week: 3 days    Minutes of Exercise per Session: 30 min  Stress: No Stress Concern Present (02/22/2021)   [place]    Feeling of Stress : Not at all  Social Connections: Moderately Integrated (02/22/2021)   Social Connection and Isolation Panel [NHANES]    Frequency of Communication with Friends and Family: More than three times a week    Frequency of Social Gatherings with Friends and Family: More than three times a week    Attends Religious Services: More than 4 times per year    Active Member of [employer] or Organizations: Yes    Attends Music therapist: More than 4 times per year    Marital Status: Divorced  Tobacco Counseling Counseling given: Not Answered   Clinical Intake:    How often do you need to have someone help you when you read instructions, pamphlets, or other written materials from your doctor or pharmacy?: (P) 2 - Rarely  Diabetic?no         Activities of Daily Living    02/23/2022   11:40 AM  In your present state of health, do you have any difficulty performing the following activities:  Hearing? 0  Vision? 0  Difficulty concentrating or making decisions? 0  Walking or climbing stairs? 0  Dressing or bathing? 0  Doing errands, shopping? 0  Preparing Food and eating ? N  Using the Toilet? N  In the past six months, have you accidently leaked urine? N  Do you have problems with loss of bowel control? N  Managing your Medications? N  Managing your Finances? N  Housekeeping or managing your Housekeeping? N    Patient Care Team: Johnette Abraham, MD as PCP - General (Internal Medicine) Croitoru, Dani Gobble, MD as PCP - Cardiology (Cardiology)  Indicate any recent Medical Services you may have received from other than [facility] providers in the past year (date may be approximate).     Assessment:   This is a routine wellness examination for [place].  Hearing/Vision screen No results found.  Dietary issues and exercise activities discussed:     Goals Addressed   None   Depression Screen     01/03/2022    8:22 AM 07/04/2021    8:07 AM 02/22/2021   11:08 AM 01/02/2021    8:02 AM 07/06/2020    1:07 PM 03/08/2020    8:42 AM 02/22/2020   10:09 AM  PHQ 2/9 Scores  PHQ - 2 Score 0 0 0 0 0 0 0  PHQ- 9 Score 0          Fall Risk    02/23/2022   11:40 AM 01/03/2022    8:21 AM 07/04/2021    8:07 AM 02/22/2021   11:12 AM 02/19/2021    2:20 PM  Fall Risk   Falls in the past year? 0 0 0 0 1  Number falls in past yr:  0 0 0 0  Injury with Fall?  0 0 0 1  Risk for fall due to :  No Fall Risks No Fall Risks    Follow up  Falls evaluation completed Falls evaluation completed Falls evaluation completed     [place]:  Any stairs in or around the home? Yes  If so, are there any without handrails? No  Home free of loose throw rugs in walkways, pet beds, electrical cords, etc? No  Adequate lighting in your home to reduce risk of falls? Yes   ASSISTIVE DEVICES UTILIZED TO PREVENT FALLS:  Life alert? No  Use of a cane, walker or w/c? No  Grab bars in the bathroom? No  Shower chair or bench in shower? No  Elevated toilet seat or a handicapped toilet? No   TIMED UP AND GO:  Was the test performed? Yes .  Length of time to ambulate 10 feet: 5 sec.   Gait steady and fast without use of assistive device  Cognitive Function:        02/22/2021   11:16 AM 02/22/2020   10:13 AM  6CIT Screen  What Year? 0 points 0 points  What month? 0 points 0 points  What time? 0 points 0 points  Count  back from 20 0 points 0 points  Months in reverse 0 points 0 points  Repeat phrase 2 points 0 points  Total Score 2 points 0 points    Immunizations Immunization History  Administered Date(s) Administered   Fluad Quad(high Dose 65+) 11/07/2020   Influenza, High Dose Seasonal PF 12/09/2017   Influenza-Unspecified 11/05/2019, 11/05/2021, 11/05/2021   Moderna SARS-COV2 Booster Vaccination 08/05/2020, 01/15/2021   Moderna Sars-Covid-2 Vaccination 03/13/2019,  04/13/2019, 12/21/2019   PNEUMOCOCCAL CONJUGATE-20 01/10/2021   Tdap 09/10/2012   Zoster Recombinat (Shingrix) 10/12/2021    TDAP status: Up to date  Flu Vaccine status: Up to date  Pneumococcal vaccine status: Up to date  Covid-19 vaccine status: Completed vaccines  Qualifies for Shingles Vaccine? Yes   Zostavax completed Yes   Shingrix Completed?: No.    Education has been provided regarding the importance of this vaccine. Patient has been advised to call insurance company to determine out of pocket expense if they have not yet received this vaccine. Advised may also receive vaccine at local pharmacy or Health Dept. Verbalized acceptance and understanding.  Screening Tests Health Maintenance  Topic Date Due   COVID-19 Vaccine (6 - 2023-24 season) 10/05/2021   Zoster Vaccines- Shingrix (2 of 2) 04/04/2022 (Originally 12/07/2021)   MAMMOGRAM  08/31/2022   DTaP/Tdap/Td (2 - Td or Tdap) 09/11/2022   Medicare Annual Wellness (AWV)  02/26/2023   COLONOSCOPY (Pts 45-54yr Insurance coverage will need to be confirmed)  12/20/2024   Pneumonia Vaccine 74 Years old  Completed   INFLUENZA VACCINE  Completed   DEXA SCAN  Completed   Hepatitis C Screening  Completed   HPV VACCINES  Aged Out    Health Maintenance  Health Maintenance Due  Topic Date Due   COVID-19 Vaccine (6 - 2023-24 season) 10/05/2021    Colorectal cancer screening: Type of screening: Colonoscopy. Completed 12/21/2014. Repeat every 10 years  Mammogram status: Completed 08/30/20. Repeat every year  Bone Density status: Completed 09/02/2018. Results reflect: Bone density results: OSTEOPENIA. Repeat every 2 years.  Lung Cancer Screening: (Low Dose CT Chest recommended if Age 74-80years, 30 pack-year currently smoking OR have quit w/in 15years.) does not qualify.   Lung Cancer Screening Referral: no  Additional Screening:  Hepatitis C Screening: does qualify; Completed 01/03/22  Vision Screening: Recommended  annual ophthalmology exams for early detection of glaucoma and other disorders of the eye. Is the patient up to date with their annual eye exam?  No  Who is the provider or what is the name of the office in which the patient attends annual eye exams? NA If pt is not established with a provider, would they like to be referred to a provider to establish care? No .   Dental Screening: Recommended annual dental exams for proper oral hygiene  Community Resource Referral / Chronic Care Management: CRR required this visit?  No   CCM required this visit?  No      Plan:     I have personally reviewed and noted the following in the patient's chart:   Medical and social history Use of alcohol, tobacco or illicit drugs  Current medications and supplements including opioid prescriptions. Patient is not currently taking opioid prescriptions. Functional ability and status Nutritional status Physical activity Advanced directives List of other physicians Hospitalizations, surgeries, and ER visits in previous 12 months Vitals Screenings to include cognitive, depression, and falls Referrals and appointments  In addition, I have reviewed and discussed with patient certain preventive protocols, quality metrics,  and best practice recommendations. A written personalized care plan for preventive services as well as general preventive health recommendations were provided to patient.    Tiffany J Bragg, CMA

## 2022-03-04 ENCOUNTER — Other Ambulatory Visit (HOSPITAL_COMMUNITY): Payer: Self-pay

## 2022-04-02 ENCOUNTER — Other Ambulatory Visit: Payer: Self-pay | Admitting: Cardiovascular Disease

## 2022-04-04 ENCOUNTER — Encounter: Payer: Self-pay | Admitting: Radiology

## 2022-05-02 ENCOUNTER — Other Ambulatory Visit: Payer: Self-pay | Admitting: Cardiovascular Disease

## 2022-06-05 IMAGING — CR DG ANKLE COMPLETE 3+V*L*
3 series · 3 of 3 positions shown · non-contrast
Comparison: None.

CLINICAL DATA: Pt states she slipped and fell this morning at
home/swelling and most of her pain is in ankle

EXAM:
LEFT ANKLE COMPLETE - 3+ VIEW

[x ap axial left]
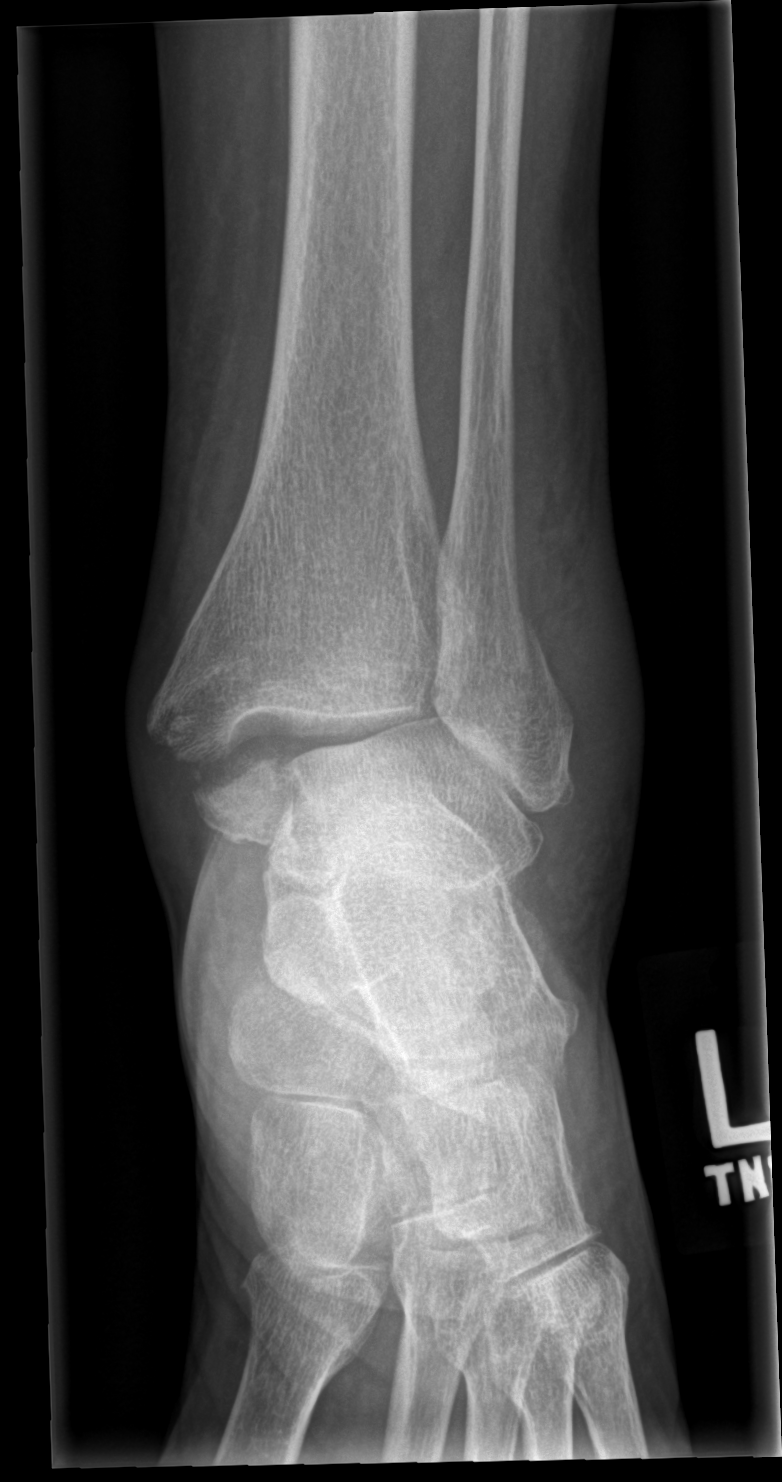

[x medial obliq left]
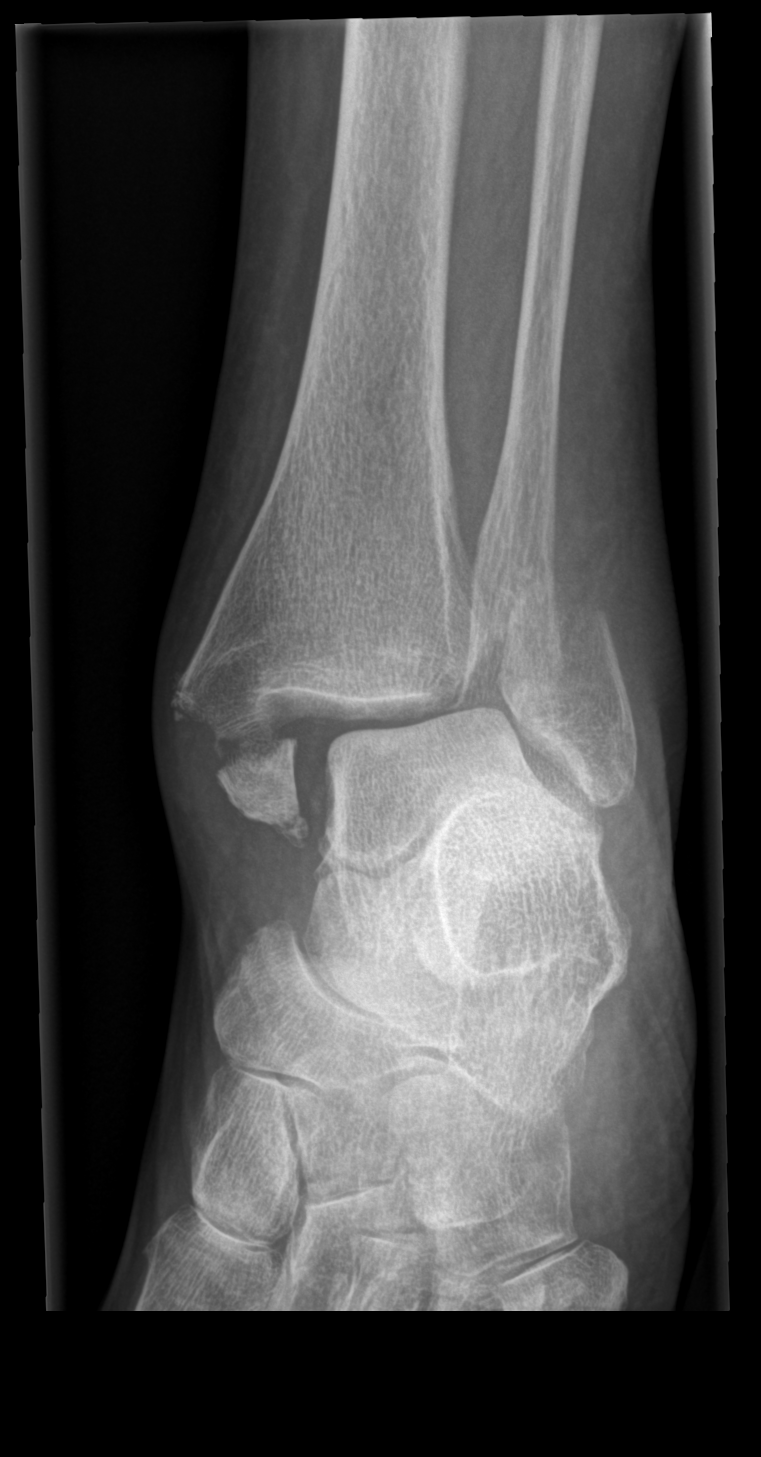

[x latera left]
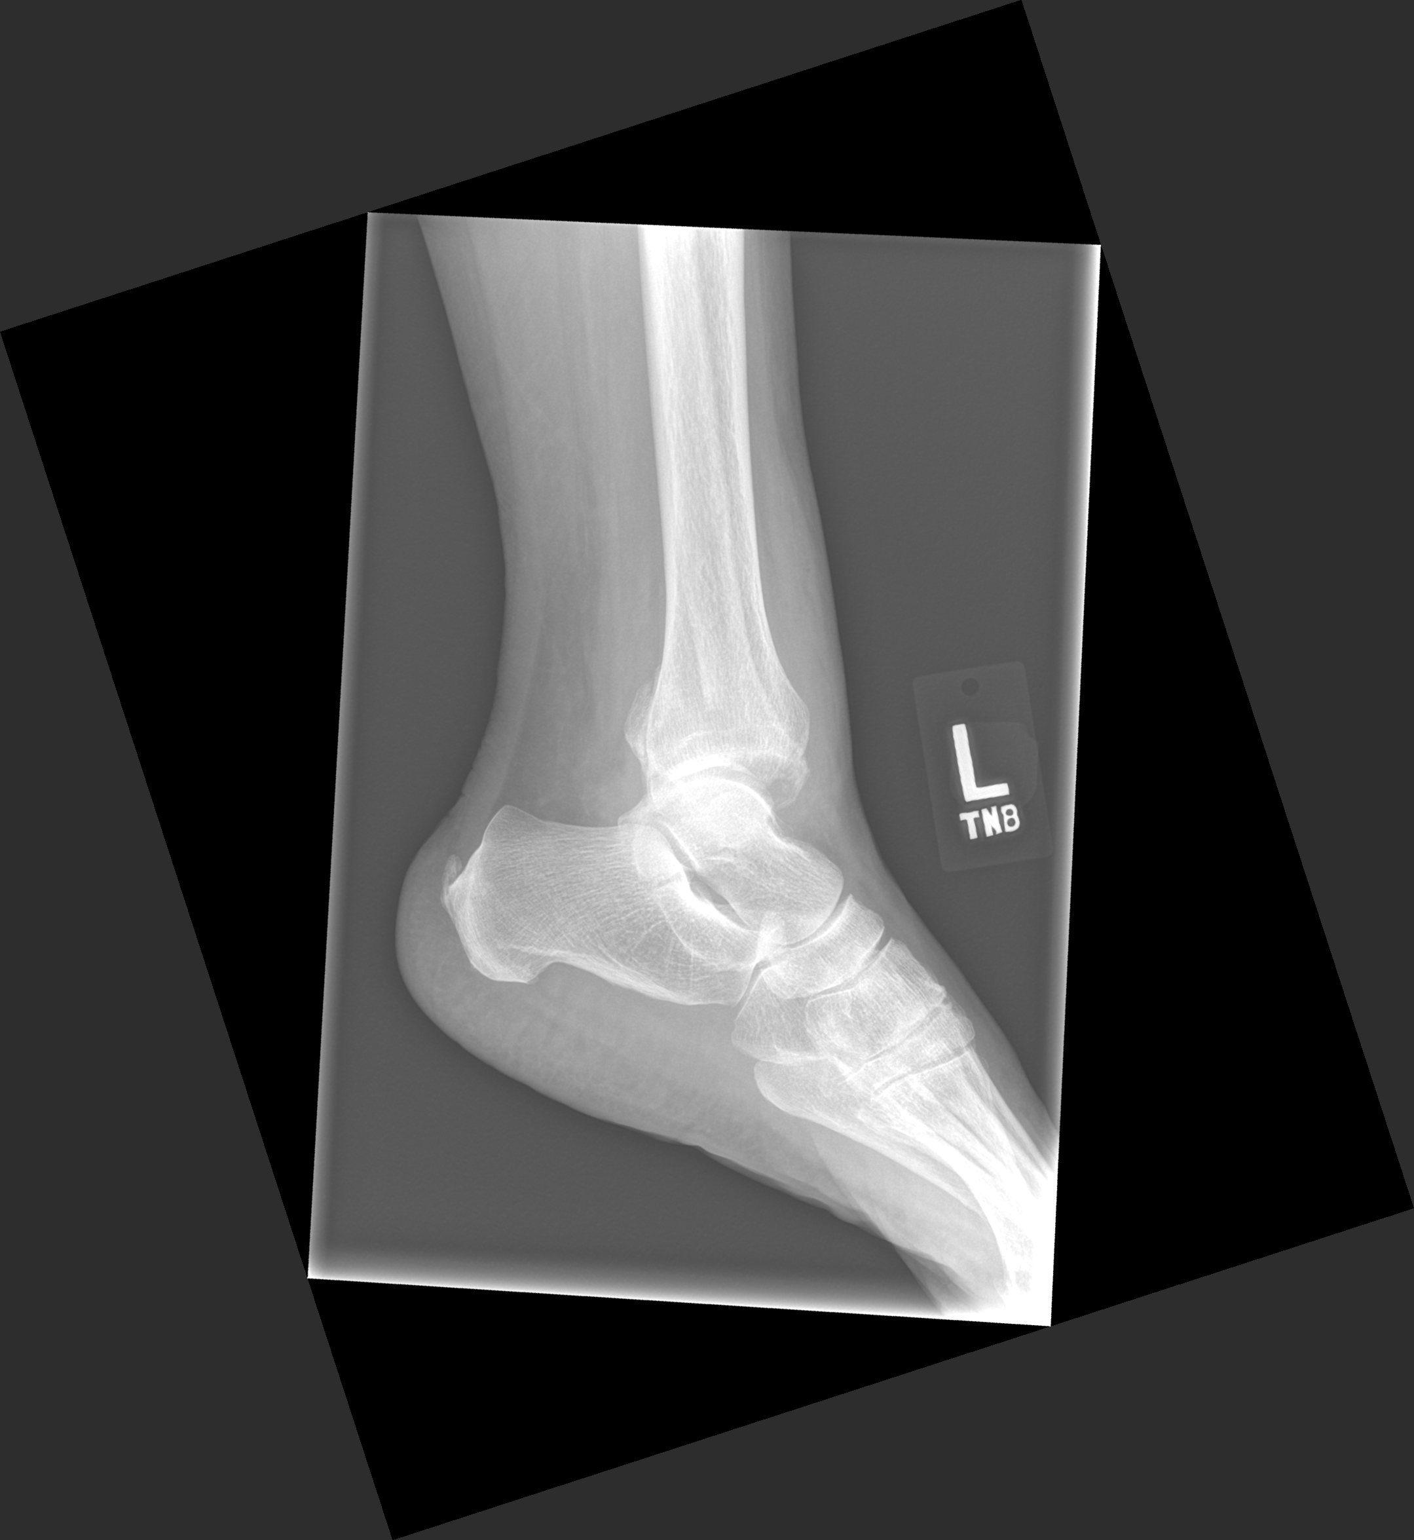

[3 of 3 positions shown; findings below may reference images not displayed]

FINDINGS: Displaced bimalleolar ankle fractures. There is a fracture across
the base of the medial malleolus with the distal fracture component
displacing inferiorly and 5-6 mm medially. There is an oblique
fracture of the distal fibula with the distal component displacing
laterally also by 5-6 mm. The ankle has subluxed laterally by 5-6 mm
and is tilted laterally, but not dislocated.

There is surrounding soft tissue swelling.
IMPRESSION: 1. Bimalleolar left ankle fracture, with the distal fracture
components displaced laterally by 5-6 mm along with lateral
subluxation of the talus. No dislocation.

## 2022-06-05 IMAGING — CR DG FOOT COMPLETE 3+V*L*
3 series · 3 of 3 positions shown · non-contrast
Comparison: None.

CLINICAL DATA: Pt states she slipped and fell this morning at
home/swelling and most of her pain is in ankle

EXAM:
LEFT FOOT - COMPLETE 3+ VIEW

[x lateral left]
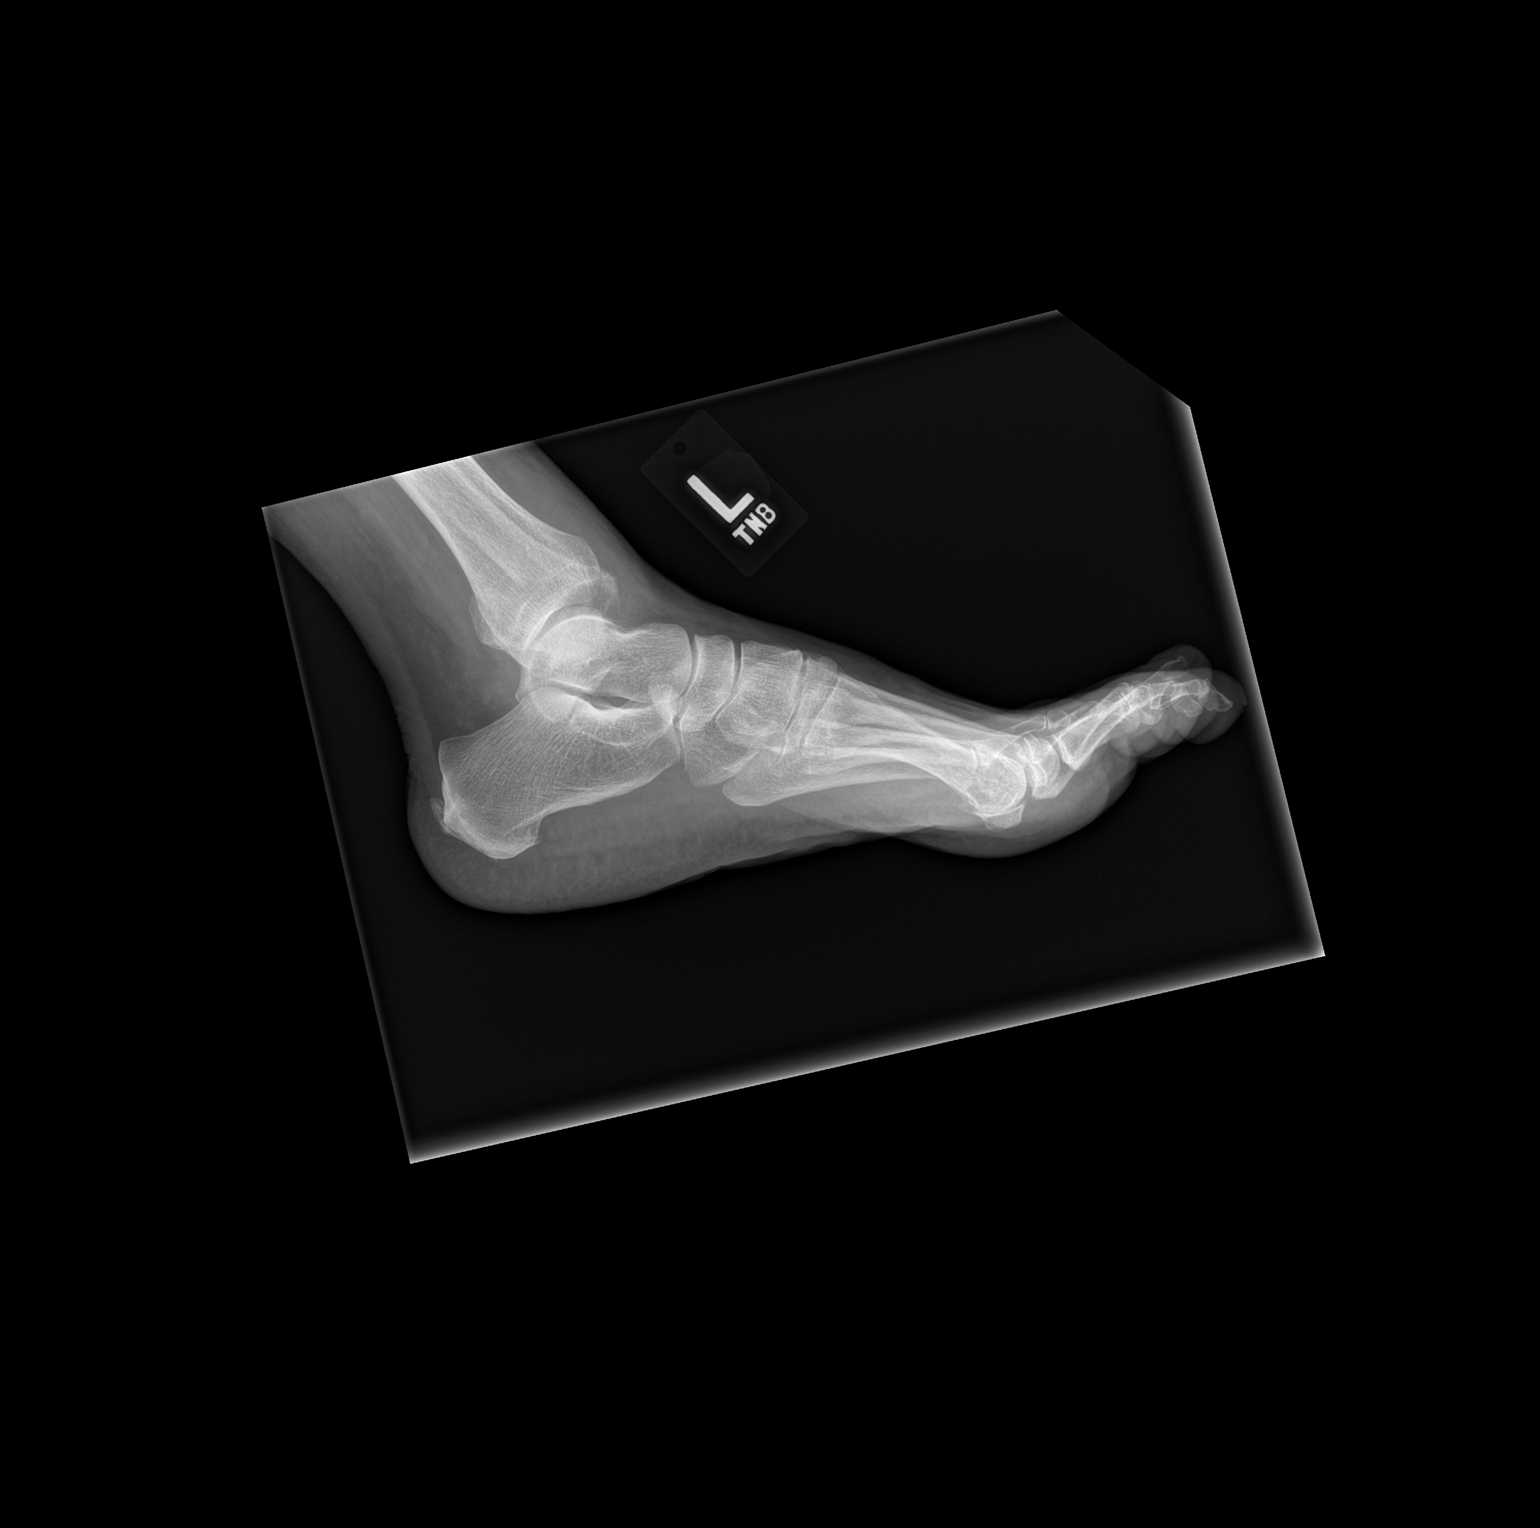

[x ap left]
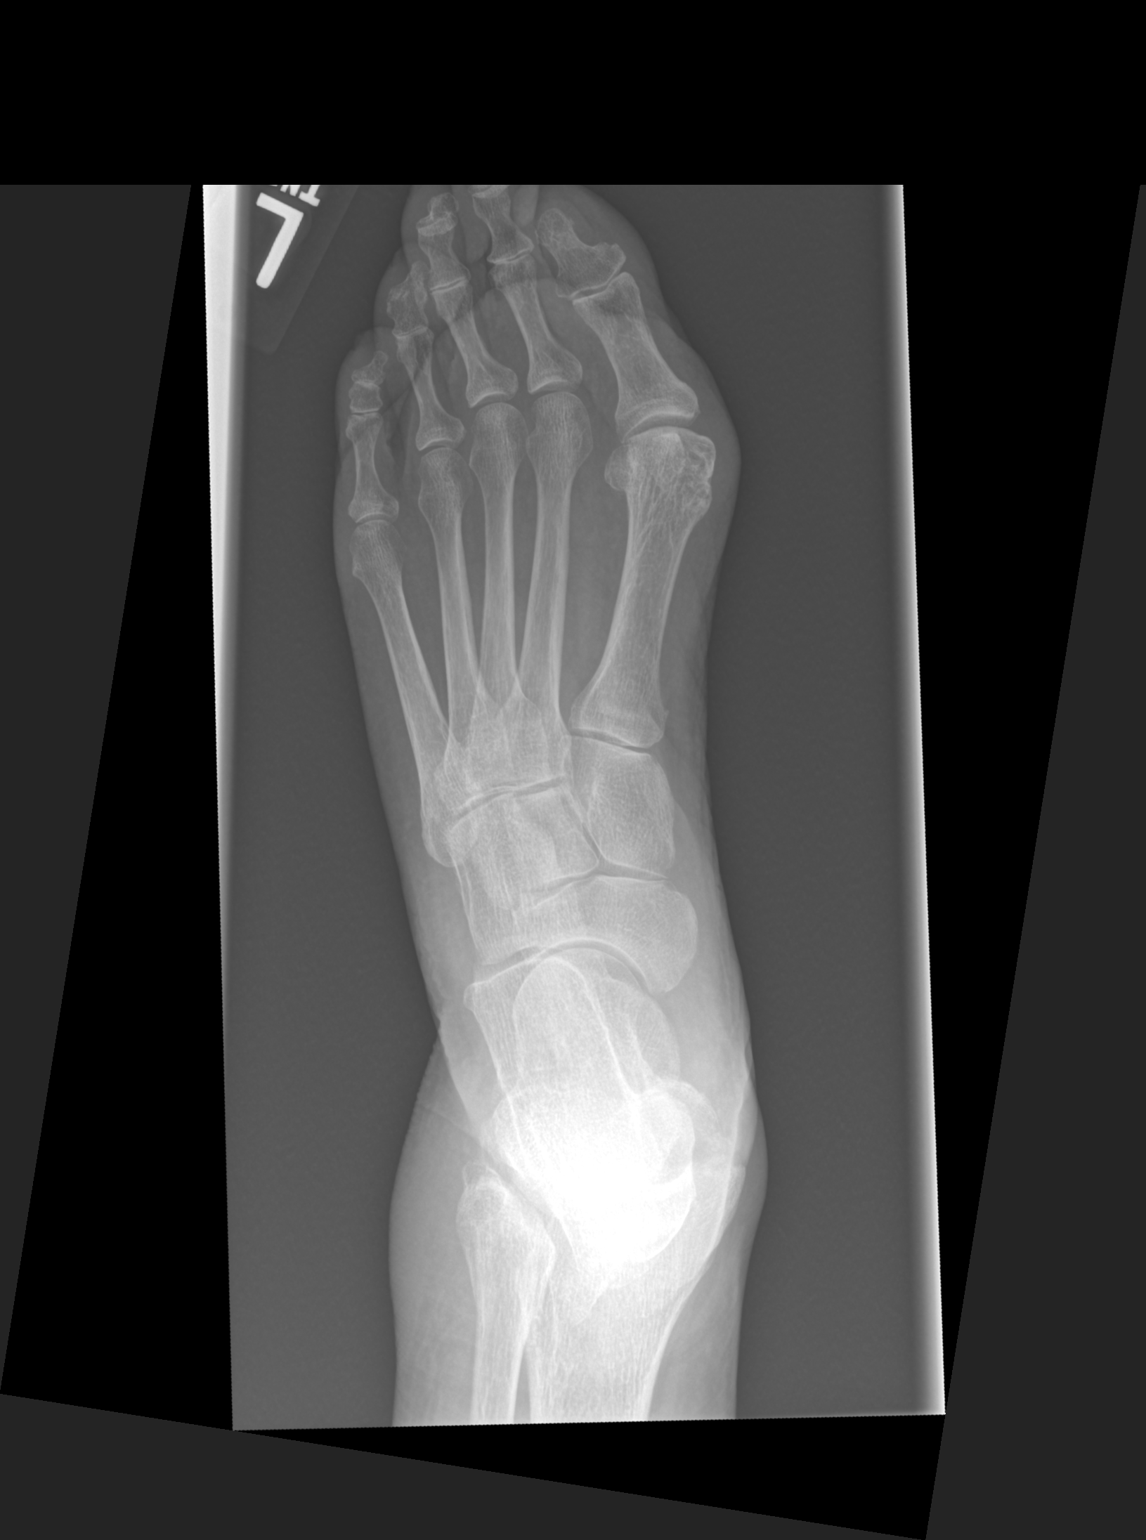

[x medial oblique left]
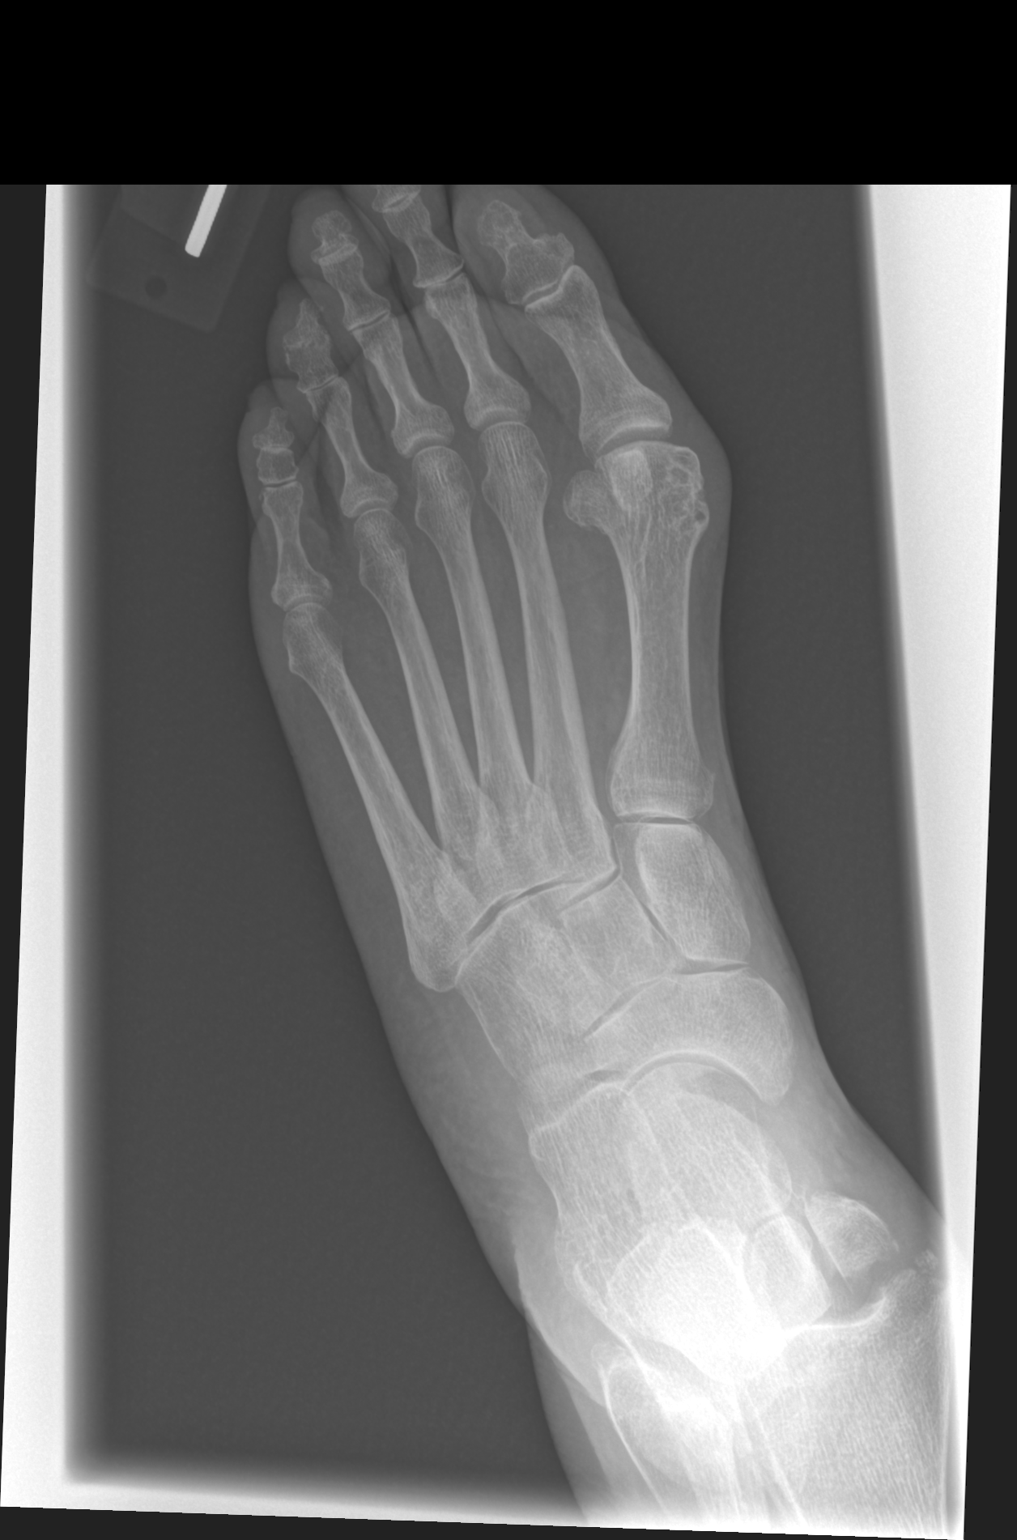

[3 of 3 positions shown; findings below may reference images not displayed]

FINDINGS: There are partly defined ankle fractures, defined under the ankle
radiographs.

No foot fracture or bone lesion.

Joints are normally aligned. There are mild degenerative changes
involving first metatarsophalangeal joint and multiple
interphalangeal joints.

Small plantar and moderate dorsal calcaneal spurs.

There is ankle soft tissue swelling.
IMPRESSION: 1. No foot fracture or dislocation.
2. Ankle fractures described under the ankle radiographs.

## 2022-06-05 IMAGING — DX DG ANKLE 2V *L*
2 series · 2 of 2 positions shown · non-contrast
Comparison: X-ray earlier in the same day

CLINICAL DATA: Post reduction radiographs

EXAM:
LEFT ANKLE - 2 VIEW

[ankle ap]
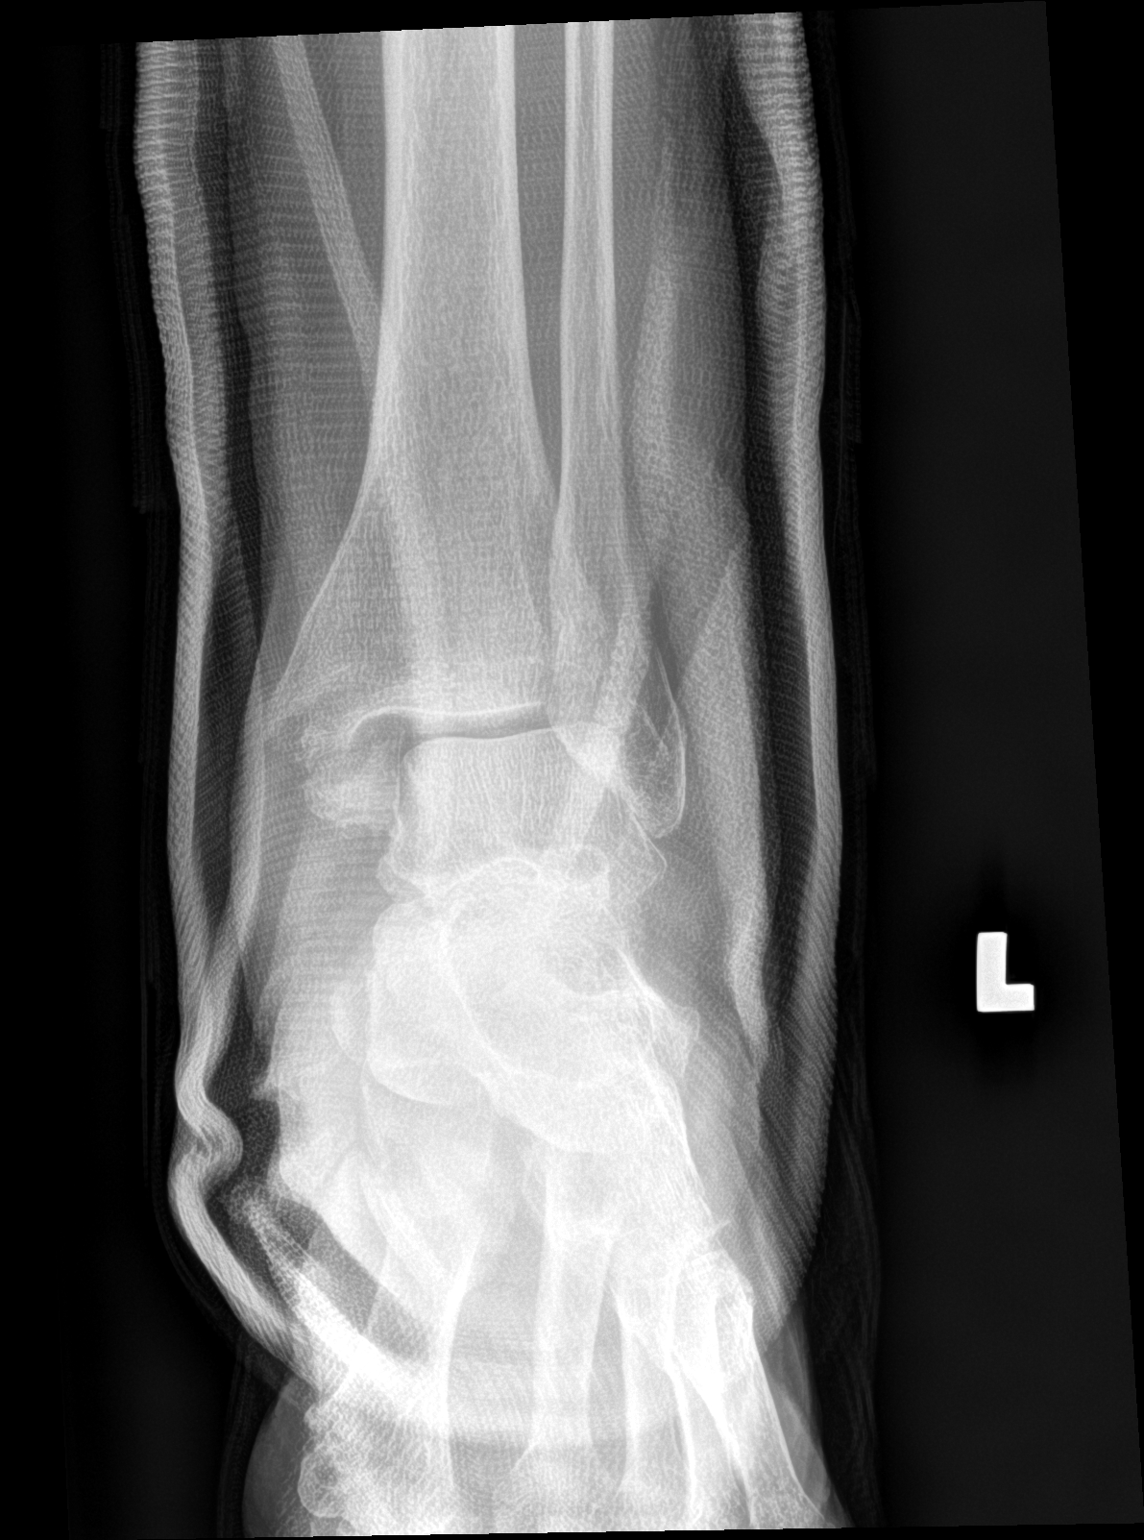

[ankle lat]
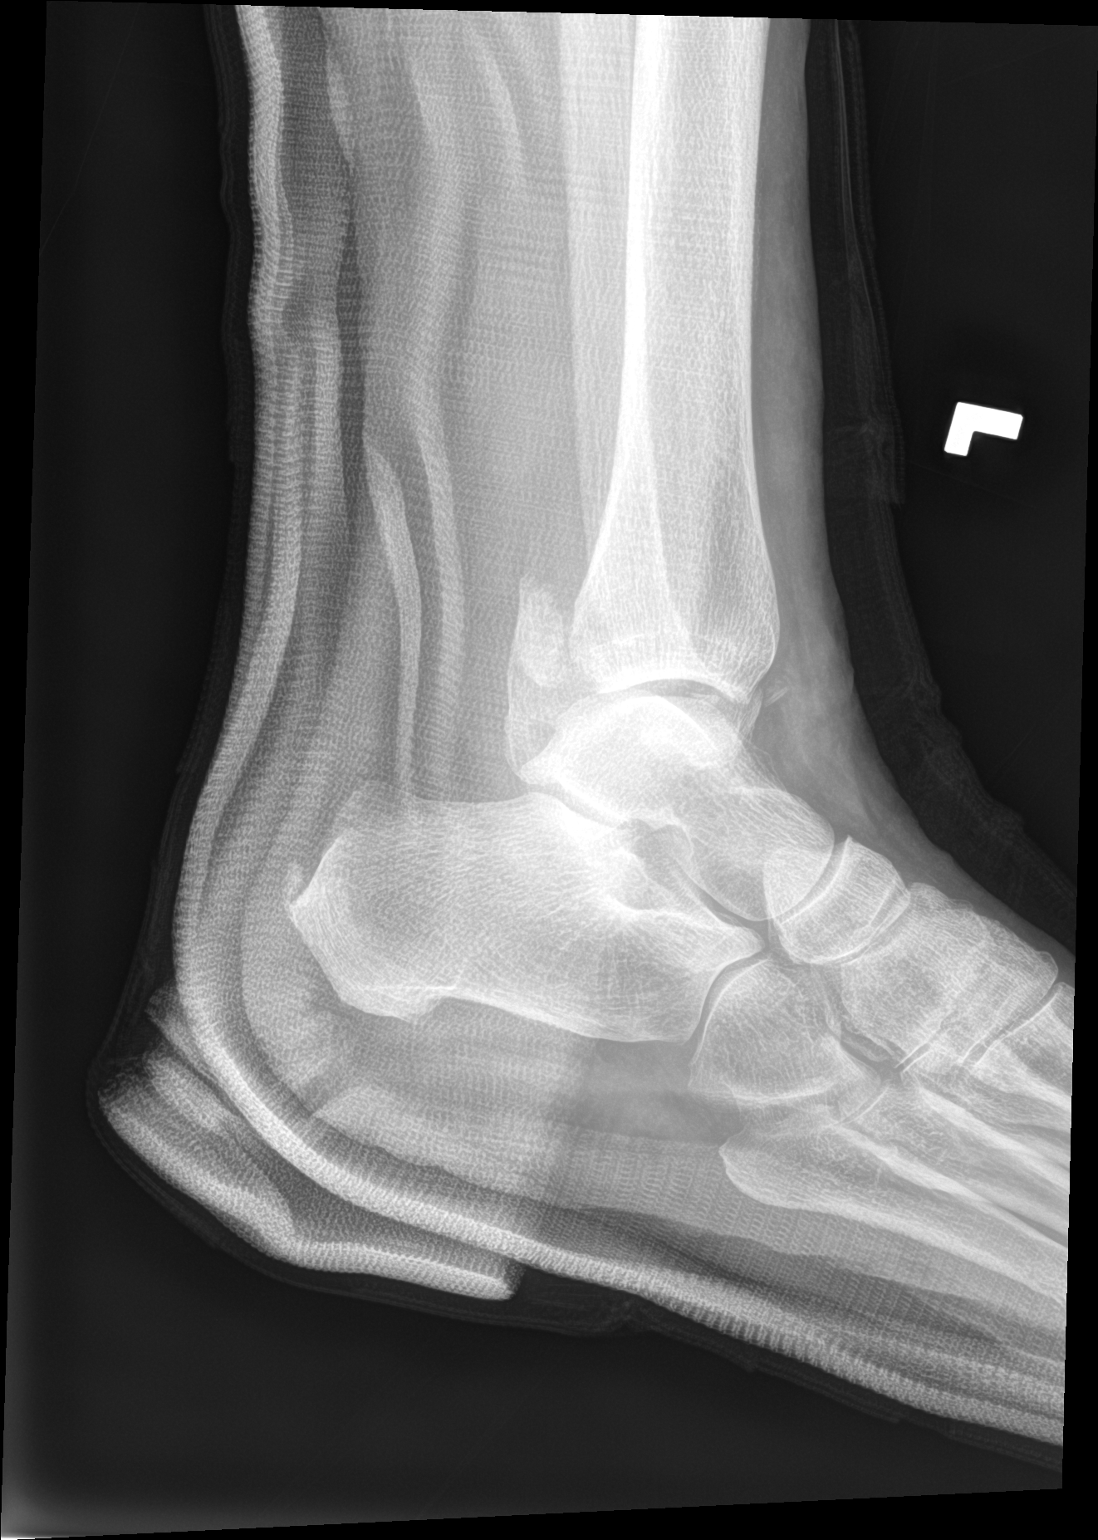

[2 of 2 positions shown; findings below may reference images not displayed]

FINDINGS: The patient has undergone placement of a fiberglass splint. Again
noted are fractures of the distal fibula and tibia. The osseous
alignment is somewhat improved, however the posterior malleolar and
medial malleolar fractures appear slightly more displaced.
IMPRESSION: Status post placement of a fiberglass splint as detailed above.

## 2022-06-06 ENCOUNTER — Other Ambulatory Visit: Payer: Self-pay | Admitting: Cardiovascular Disease

## 2022-06-18 ENCOUNTER — Ambulatory Visit (INDEPENDENT_AMBULATORY_CARE_PROVIDER_SITE_OTHER): Payer: Medicare Other | Admitting: Internal Medicine

## 2022-06-18 ENCOUNTER — Encounter: Payer: Self-pay | Admitting: Internal Medicine

## 2022-06-18 VITALS — BP 143/83 | HR 96 | Ht 61.0 in | Wt 127.2 lb

## 2022-06-18 DIAGNOSIS — B9689 Other specified bacterial agents as the cause of diseases classified elsewhere: Secondary | ICD-10-CM | POA: Diagnosis not present

## 2022-06-18 DIAGNOSIS — J208 Acute bronchitis due to other specified organisms: Secondary | ICD-10-CM | POA: Diagnosis not present

## 2022-06-18 MED ORDER — AZITHROMYCIN 250 MG PO TABS: ORAL_TABLET | ORAL | 0 refills | Status: DC

## 2022-06-18 NOTE — Progress Notes (Signed)
   Acute Office Visit  Subjective:     Patient ID: Alyssa Navarro, female    DOB: [birth year], 72 y.o.   MRN: [id number]  Chief Complaint  Patient presents with   URI    Patient has had URI for two weeks now. Patient is currently nose runny, productive cough, hoarse, thick yellow mucus.    [name] presents for an acute visit endorsing a 2-week history of upper respiratory symptoms.  Most recently, she has developed a cough productive of yellow/cloudy white sputum.  She endorses sinus/nasal congestion as well.  She babysat her great niece 2 weeks ago, who had bronchitis.  She has tried OTC cough/cold medications without significant symptom improvement.  [name] denies fever/chills and throat irritation.  Review of Systems  HENT:  Positive for congestion and sinus pain.   Respiratory:  Positive for cough and sputum production. Negative for hemoptysis, shortness of breath and wheezing.       Objective:    BP (!) 143/83   Pulse 96   Ht 5\' 1"  (1.549 m)   Wt 127 lb 3.2 oz (57.7 kg)   SpO2 92%   BMI 24.03 kg/m   Physical Exam Vitals reviewed.  Constitutional:      Appearance: Normal appearance.  HENT:     Head: Normocephalic and atraumatic.     Right Ear: Tympanic membrane normal.     Left Ear: Tympanic membrane normal.     Nose: Congestion present.     Mouth/Throat:     Mouth: Mucous membranes are moist.     Pharynx: Oropharynx is clear. No oropharyngeal exudate or posterior oropharyngeal erythema.  Eyes:     Extraocular Movements: Extraocular movements intact.     Conjunctiva/sclera: Conjunctivae normal.     Pupils: Pupils are equal, round, and reactive to light.  Cardiovascular:     Rate and Rhythm: Normal rate and regular rhythm.     Pulses: Normal pulses.     Heart sounds: Normal heart sounds.  Pulmonary:     Effort: Pulmonary effort is normal. No respiratory distress.     Comments: Coughing with deep inspiration Abdominal:     General: Abdomen is flat. Bowel  sounds are normal. There is no distension.     Palpations: Abdomen is soft.     Tenderness: There is no abdominal tenderness.  Musculoskeletal:     Cervical back: Normal range of motion.  Neurological:     Mental Status: She is alert.       Assessment & Plan:   Problem List Items Addressed This Visit       Acute bacterial bronchitis - Primary    Presenting today endorsing a 2-week history of URI symptoms.  Most recently she has developed a cough productive of yellow/cloudy white sputum.  Denies fever/chills.  Begins to cough with deep inspiration. -Z-Pak prescribed for treatment of acute bacterial bronchitis -Recommended Mucinex DM for cough relief -She was instructed to return to care if her symptoms worsen or fail to improve.  Otherwise she is scheduled for routine follow-up with me on 5/30.      Meds ordered this encounter  Medications   azithromycin (ZITHROMAX Z-PAK) 250 MG tablet    Sig: Take 2 tablets (500 mg) PO today, then 1 tablet (250 mg) PO daily x4 days.    Dispense:  6 tablet    Refill:  0   Return if symptoms worsen or fail to improve.  Billie Lade, MD

## 2022-06-18 NOTE — Patient Instructions (Signed)
It was a pleasure to see you today.  Thank you for giving [place] the opportunity to be involved in your care.  Below is a brief recap of your visit and next steps.  We will plan to see you again in 5/30.  Summary Z pak prescribed for treatment of bronchitis Follow up in 5/30 Try Mucinex-DM for cough relief

## 2022-06-18 NOTE — Assessment & Plan Note (Addendum)
Presenting today endorsing a 2-week history of URI symptoms.  Most recently she has developed a cough productive of yellow/cloudy white sputum.  Denies fever/chills.  Begins to cough with deep inspiration. -Z-Pak prescribed for treatment of acute bacterial bronchitis -Recommended Mucinex DM for cough relief -She was instructed to return to care if her symptoms worsen or fail to improve.  Otherwise she is scheduled for routine follow-up with me on 5/30.

## 2022-07-04 ENCOUNTER — Ambulatory Visit (INDEPENDENT_AMBULATORY_CARE_PROVIDER_SITE_OTHER): Payer: Medicare Other | Admitting: Internal Medicine

## 2022-07-04 ENCOUNTER — Encounter: Payer: Self-pay | Admitting: Internal Medicine

## 2022-07-04 VITALS — BP 138/74 | HR 72 | Ht 61.0 in | Wt 129.8 lb

## 2022-07-04 DIAGNOSIS — M8580 Other specified disorders of bone density and structure, unspecified site: Secondary | ICD-10-CM | POA: Diagnosis not present

## 2022-07-04 DIAGNOSIS — I2510 Atherosclerotic heart disease of native coronary artery without angina pectoris: Secondary | ICD-10-CM | POA: Diagnosis not present

## 2022-07-04 DIAGNOSIS — E782 Mixed hyperlipidemia: Secondary | ICD-10-CM | POA: Diagnosis not present

## 2022-07-04 DIAGNOSIS — I10 Essential (primary) hypertension: Secondary | ICD-10-CM | POA: Diagnosis not present

## 2022-07-04 NOTE — Progress Notes (Signed)
Established Patient Office Visit  Subjective   Patient ID: Alyssa Navarro, female    DOB: 1950-10-29  Age: 72 y.o. MRN: [id number]  Chief Complaint  Patient presents with   Follow-up    Follow up still has productive cough, voice fades   Alyssa Navarro returns to care today for routine follow-up.  Last evaluated by me on 5/14 for an acute visit, treated for bronchitis.  Previously seen by me for routine follow-up on 01/03/22.  Seen by cardiology for follow-up on 1/4.  There have otherwise been no acute interval events.  Alyssa Navarro reports feeling well today.  She states that she occasionally has a cough, but overall recent respiratory symptoms are resolving.  She has no acute concerns to discuss.  Past Medical History:  Diagnosis Date   Allergy    Anemia    Arthritis    Coronary artery disease    Hyperlipidemia    Hypertension    Screening for malignant neoplasm of the cervix 06/23/2013   Trichimoniasis 07/19/2013   Past Surgical History:  Procedure Laterality Date   2D Echocardiogram  12/22/2007   EF >55%, normal.   ABDOMINAL HYSTERECTOMY     ANKLE FRACTURE SURGERY Left 02/17/2020   APPENDECTOMY     ARTHROSCOPY WITH ANTERIOR CRUCIATE LIGAMENT (ACL) REPAIR WITH ANTERIOR TIBILIAS GRAFT Right 2007   BUNIONECTOMY Bilateral    CARDIAC CATHETERIZATION  02/12/1991   CAD demonstrated in Proximal LAD-50% stenosis, Distal LAD-60%, Mid Circumflex-60% stenosis, Marginal Circumflex-80% stenosis, Proximal RCA-70% stenosis   CARDIAC CATHETERIZATION  11/12/1993   Mild-moderate 3-vessel disease-50% mid LAD, 75% mid circumflex, 80% ostial portion L circumflex.   CARDIAC CATHETERIZATION  12/03/1996   95% mid circumflex stenosis stented with a 3.5x36mm Sci-Med NIR, resulting in reduciton to 0%. 75% distal circumflex stenosis stented with a 3.0x23mm NIR stent resulting in reduciton to 0%.   CARDIAC CATHETERIZATION  08/23/2003   Continued medical therapy. Patent L circumflex stents placed in 1998.    CHOLECYSTECTOMY     COLONOSCOPY N/A 12/21/2014   Procedure: COLONOSCOPY;  Surgeon: Malissa Hippo, MD;  Location: [facility];  Service: Endoscopy;  Laterality: N/A;  830   FRACTURE SURGERY  January 13,2022   Fx. Left Ankle   JOINT REPLACEMENT  April 23,1998   [facility] (R) foot   Lexiscan Myoview  12/22/2007   No ECG changes, nondiagnostic EKG. Perfusion defect in inferior myocardial region consistent with diaphragmatic attenuation.   ORIF ANKLE FRACTURE Left 02/17/2020   Procedure: OPEN REDUCTION INTERNAL FIXATION (ORIF) ANKLE FRACTURE;  Surgeon: Oliver Barre, MD;  Location: [facility];  Service: Orthopedics;  Laterality: Left;   TUBAL LIGATION     Social History   Tobacco Use   Smoking status: Former    Packs/day: 0.25    Years: 15.00    Additional pack years: 0.00    Total pack years: 3.75    Types: Cigarettes    Quit date: 01/29/1999    Years since quitting: 23.4   Smokeless tobacco: Never   Tobacco comments:    Stress smoker  Vaping Use   Vaping Use: Never used  Substance Use Topics   Alcohol use: No   Drug use: Never   Family History  Problem Relation Age of Onset   Diabetes Mother    Hypertension Mother    Hyperlipidemia Mother    Arthritis Mother    Heart disease Mother    Heart failure Other    Hypertension Other    Diabetes  Other    Hyperlipidemia Father    Alcohol abuse Father    Heart disease Father    Hypertension Father    Heart disease Sister    Cancer Maternal Aunt        breast   Breast cancer Maternal Aunt    Cancer Maternal Grandmother        breast   Diabetes Maternal Grandmother    Hypertension Paternal Grandmother    Heart failure Paternal Grandmother    Breast cancer Paternal Grandmother    Fibroids Sister    Allergies  Allergen Reactions   Avelox [Moxifloxacin Hcl In Nacl]    Quinolones     Hallucinations, sleeplessness, rash   Statins     Myalgias (high doses only)   Latex Rash   Metoprolol Rash   Sulfa  Antibiotics Rash   Review of Systems  Constitutional:  Negative for chills and fever.  HENT:  Negative for sore throat.   Respiratory:  Negative for cough and shortness of breath.   Cardiovascular:  Negative for chest pain, palpitations and leg swelling.  Gastrointestinal:  Negative for abdominal pain, blood in stool, constipation, diarrhea, nausea and vomiting.  Genitourinary:  Negative for dysuria and hematuria.  Musculoskeletal:  Negative for myalgias.  Skin:  Negative for itching and rash.  Neurological:  Negative for dizziness and headaches.  Psychiatric/Behavioral:  Negative for depression and suicidal ideas.      Objective:     BP 138/74 (BP Location: Left Arm, Patient Position: Sitting, Cuff Size: Normal)   Pulse 72   Ht 5\' 1"  (1.549 m)   Wt 129 lb 12.8 oz (58.9 kg)   SpO2 93%   BMI 24.53 kg/m  BP Readings from Last 3 Encounters:  07/04/22 138/74  06/18/22 (!) 143/83  02/25/22 (!) 146/85   Physical Exam Vitals reviewed.  Constitutional:      General: She is not in acute distress.    Appearance: Normal appearance. She is not toxic-appearing.  HENT:     Head: Normocephalic and atraumatic.     Right Ear: External ear normal.     Left Ear: External ear normal.     Nose: Nose normal. No congestion or rhinorrhea.     Mouth/Throat:     Mouth: Mucous membranes are moist.     Pharynx: Oropharynx is clear. No oropharyngeal exudate or posterior oropharyngeal erythema.  Eyes:     General: No scleral icterus.    Extraocular Movements: Extraocular movements intact.     Conjunctiva/sclera: Conjunctivae normal.     Pupils: Pupils are equal, round, and reactive to light.  Cardiovascular:     Rate and Rhythm: Normal rate and regular rhythm.     Pulses: Normal pulses.     Heart sounds: Normal heart sounds. No murmur heard.    No friction rub. No gallop.  Pulmonary:     Effort: Pulmonary effort is normal.     Breath sounds: Normal breath sounds. No wheezing, rhonchi or  rales.  Abdominal:     General: Abdomen is flat. Bowel sounds are normal. There is no distension.     Palpations: Abdomen is soft.     Tenderness: There is no abdominal tenderness.  Musculoskeletal:        General: No swelling. Normal range of motion.     Cervical back: Normal range of motion.     Right lower leg: No edema.     Left lower leg: No edema.  Lymphadenopathy:     Cervical: No  cervical adenopathy.  Skin:    General: Skin is warm and dry.     Capillary Refill: Capillary refill takes less than 2 seconds.     Coloration: Skin is not jaundiced.  Neurological:     General: No focal deficit present.     Mental Status: She is alert and oriented to person, place, and time.  Psychiatric:        Mood and Affect: Mood normal.        Behavior: Behavior normal.   Last CBC Lab Results  Component Value Date   WBC 6.8 01/03/2022   HGB 12.8 01/03/2022   HCT 38.9 01/03/2022   MCV 91 01/03/2022   MCH 29.8 01/03/2022   RDW 13.7 01/03/2022   PLT 198 01/03/2022   Last metabolic panel Lab Results  Component Value Date   GLUCOSE 91 01/03/2022   NA 144 01/03/2022   K 3.7 01/03/2022   CL 103 01/03/2022   CO2 27 01/03/2022   BUN 10 01/03/2022   CREATININE 0.64 01/03/2022   EGFR 93 01/03/2022   CALCIUM 10.2 01/03/2022   PROT 7.1 01/03/2022   ALBUMIN 4.7 01/03/2022   LABGLOB 2.4 01/03/2022   AGRATIO 2.0 01/03/2022   BILITOT 0.4 01/03/2022   ALKPHOS 97 01/03/2022   AST 22 01/03/2022   ALT 27 01/03/2022   ANIONGAP 10 02/15/2020   Last lipids Lab Results  Component Value Date   CHOL 136 01/03/2022   HDL 72 01/03/2022   LDLCALC 47 01/03/2022   TRIG 91 01/03/2022   CHOLHDL 1.9 01/03/2022   Last thyroid functions Lab Results  Component Value Date   TSH 1.360 01/03/2022   Last vitamin D Lab Results  Component Value Date   VD25OH 85.4 01/03/2022   Last vitamin B12 and Folate Lab Results  Component Value Date   VITAMINB12 959 01/03/2022   FOLATE 19.8 01/03/2022    The 10-year ASCVD risk score (Arnett DK, et al., 2019) is: 12.7%    Assessment & Plan:   Problem List Items Addressed This Visit       CAD (coronary artery disease)     Denies recent chest pain.  History of CAD with DES x 2 in 1998.  Continue atorvastatin and ASA 81 mg daily.      Essential hypertension - Primary    BP is acceptable on current regimen of amlodipine and carvedilol.  No medication changes are indicated today.      Osteopenia    She remains on Fosamax 70 mg weekly (started in 2022) and vitamin D-calcium supplementation.       Mixed hyperlipidemia    Lipid panel updated November 2023.  Well-controlled on Lipitor and Repatha.  No medication changes are indicated.       Return in about 6 months (around 01/04/2023) for CPE.    Billie Lade, MD

## 2022-07-04 NOTE — Patient Instructions (Signed)
It was a pleasure to see you today.  Thank you for giving us the opportunity to be involved in your care.  Below is a brief recap of your visit and next steps.  We will plan to see you again in 6 months.  Summary No medication changes today We will plan for follow up in 6 months  

## 2022-07-10 NOTE — Assessment & Plan Note (Signed)
Lipid panel updated November 2023.  Well-controlled on Lipitor and Repatha.  No medication changes are indicated.

## 2022-07-10 NOTE — Assessment & Plan Note (Signed)
She remains on Fosamax 70 mg weekly (started in 2022) and vitamin D-calcium supplementation.

## 2022-07-10 NOTE — Assessment & Plan Note (Signed)
Denies recent chest pain.  History of CAD with DES x 2 in 1998.  Continue atorvastatin and ASA 81 mg daily.

## 2022-07-10 NOTE — Assessment & Plan Note (Signed)
BP is acceptable on current regimen of amlodipine and carvedilol.  No medication changes are indicated today.

## 2022-07-31 ENCOUNTER — Other Ambulatory Visit: Payer: Self-pay | Admitting: Cardiovascular Disease

## 2022-11-12 DIAGNOSIS — Z23 Encounter for immunization: Secondary | ICD-10-CM | POA: Diagnosis not present

## 2023-01-03 ENCOUNTER — Encounter: Payer: Medicare Other | Admitting: Internal Medicine

## 2023-01-06 ENCOUNTER — Other Ambulatory Visit (HOSPITAL_COMMUNITY): Payer: Self-pay | Admitting: Internal Medicine

## 2023-01-06 DIAGNOSIS — Z1231 Encounter for screening mammogram for malignant neoplasm of breast: Secondary | ICD-10-CM

## 2023-01-08 ENCOUNTER — Ambulatory Visit (INDEPENDENT_AMBULATORY_CARE_PROVIDER_SITE_OTHER): Payer: Medicare Other | Admitting: Internal Medicine

## 2023-01-08 ENCOUNTER — Encounter: Payer: Self-pay | Admitting: Internal Medicine

## 2023-01-08 ENCOUNTER — Other Ambulatory Visit: Payer: Self-pay | Admitting: Cardiovascular Disease

## 2023-01-08 VITALS — BP 150/82 | HR 81 | Ht 61.0 in | Wt 132.8 lb

## 2023-01-08 DIAGNOSIS — I10 Essential (primary) hypertension: Secondary | ICD-10-CM

## 2023-01-08 DIAGNOSIS — E782 Mixed hyperlipidemia: Secondary | ICD-10-CM | POA: Diagnosis not present

## 2023-01-08 DIAGNOSIS — M8580 Other specified disorders of bone density and structure, unspecified site: Secondary | ICD-10-CM

## 2023-01-08 DIAGNOSIS — E559 Vitamin D deficiency, unspecified: Secondary | ICD-10-CM | POA: Diagnosis not present

## 2023-01-08 NOTE — Assessment & Plan Note (Signed)
BP is mildly elevated today.  She endorses compliance with amlodipine 10 mg daily and carvedilol 6.25 mg twice daily.  Previously well-controlled on this regimen.  She attributes today's elevated readings to stress. -No medication changes were made today.  Patient will follow-up in 1 month for BP check.  In the interim, I requested that she check and record home blood pressure readings to bring to her appointment.

## 2023-01-08 NOTE — Assessment & Plan Note (Signed)
She remains on atorvastatin and Repatha.  Repeat lipid panel ordered today.

## 2023-01-08 NOTE — Assessment & Plan Note (Signed)
She remains on Fosamax 70 mg weekly (started in 2022) and vitamin D-calcium supplementation.

## 2023-01-08 NOTE — Progress Notes (Signed)
Complete physical exam  Patient: Alyssa Navarro   DOB: 05/10/1950   72 y.o. Female  MRN: [id number]  Subjective:    Chief Complaint  Patient presents with   Annual Exam    Alyssa Navarro is a 72 y.o. female who presents today for a complete physical exam. She reports consuming a general diet. The patient does not participate in regular exercise at present. She generally feels well. She reports sleeping well. She does not have additional problems to discuss today.    Most recent fall risk assessment:    01/08/2023    3:42 PM  Fall Risk   Falls in the past year? 0  Number falls in past yr: 0  Injury with Fall? 0  Risk for fall due to : No Fall Risks  Follow up Falls evaluation completed   Most recent depression screenings:    01/08/2023    3:42 PM 07/04/2022    8:11 AM  PHQ 2/9 Scores  PHQ - 2 Score 0 0   Vision:Not within last year  and Dental: No current dental problems and Receives regular dental care  Past Medical History:  Diagnosis Date   Allergy    Anemia    Arthritis    Coronary artery disease    Hyperlipidemia    Hypertension    Screening for malignant neoplasm of the cervix 06/23/2013   Trichimoniasis 07/19/2013   Past Surgical History:  Procedure Laterality Date   2D Echocardiogram  12/22/2007   EF >55%, normal.   ABDOMINAL HYSTERECTOMY     ANKLE FRACTURE SURGERY Left 02/17/2020   APPENDECTOMY     ARTHROSCOPY WITH ANTERIOR CRUCIATE LIGAMENT (ACL) REPAIR WITH ANTERIOR TIBILIAS GRAFT Right 2007   BUNIONECTOMY Bilateral    CARDIAC CATHETERIZATION  02/12/1991   CAD demonstrated in Proximal LAD-50% stenosis, Distal LAD-60%, Mid Circumflex-60% stenosis, Marginal Circumflex-80% stenosis, Proximal RCA-70% stenosis   CARDIAC CATHETERIZATION  11/12/1993   Mild-moderate 3-vessel disease-50% mid LAD, 75% mid circumflex, 80% ostial portion L circumflex.   CARDIAC CATHETERIZATION  12/03/1996   95% mid circumflex stenosis stented with a 3.5x73mm Sci-Med NIR,  resulting in reduciton to 0%. 75% distal circumflex stenosis stented with a 3.0x11mm NIR stent resulting in reduciton to 0%.   CARDIAC CATHETERIZATION  08/23/2003   Continued medical therapy. Patent L circumflex stents placed in 1998.   CHOLECYSTECTOMY     COLONOSCOPY N/A 12/21/2014   Procedure: COLONOSCOPY;  Surgeon: Malissa Hippo, MD;  Location: [facility];  Service: Endoscopy;  Laterality: N/A;  830   FRACTURE SURGERY  January 13,2022   Fx. Left Ankle   JOINT REPLACEMENT  April 23,1998   [facility] (R) foot   Lexiscan Myoview  12/22/2007   No ECG changes, nondiagnostic EKG. Perfusion defect in inferior myocardial region consistent with diaphragmatic attenuation.   ORIF ANKLE FRACTURE Left 02/17/2020   Procedure: OPEN REDUCTION INTERNAL FIXATION (ORIF) ANKLE FRACTURE;  Surgeon: Oliver Barre, MD;  Location: [facility];  Service: Orthopedics;  Laterality: Left;   TUBAL LIGATION     Social History   Tobacco Use   Smoking status: Former    Current packs/day: 0.00    Average packs/day: 0.3 packs/day for 15.0 years (3.8 ttl pk-yrs)    Types: Cigarettes    Start date: 01/29/1984    Quit date: 01/29/1999    Years since quitting: 23.9   Smokeless tobacco: Never   Tobacco comments:    Stress smoker  Vaping Use   Vaping status: Never  Used  Substance Use Topics   Alcohol use: No   Drug use: Never   Family History  Problem Relation Age of Onset   Diabetes Mother    Hypertension Mother    Hyperlipidemia Mother    Arthritis Mother    Heart disease Mother    Heart failure Other    Hypertension Other    Diabetes Other    Hyperlipidemia Father    Alcohol abuse Father    Heart disease Father    Hypertension Father    Heart disease Sister    Cancer Maternal Aunt        breast   Breast cancer Maternal Aunt    Cancer Maternal Grandmother        breast   Diabetes Maternal Grandmother    Hypertension Paternal Grandmother    Heart failure Paternal Grandmother    Breast  cancer Paternal Grandmother    Fibroids Sister    Allergies  Allergen Reactions   Avelox [Moxifloxacin Hcl In Nacl]    Quinolones     Hallucinations, sleeplessness, rash   Statins     Myalgias (high doses only)   Latex Rash   Metoprolol Rash   Sulfa Antibiotics Rash   Patient Care Team: Billie Lade, MD as PCP - General (Internal Medicine) Croitoru, Rachelle Hora, MD as PCP - Cardiology (Cardiology)   Outpatient Medications Prior to Visit  Medication Sig   Acetaminophen (TYLENOL 8 HOUR PO) Take 1 tablet by mouth as needed.   alendronate (FOSAMAX) 70 MG tablet TAKE 1 TABLET(70 MG) BY MOUTH EVERY 7 DAYS WITH A FULL GLASS OF WATER AND ON AN EMPTY STOMACH   amLODipine (NORVASC) 10 MG tablet TAKE 1 TABLET(10 MG) BY MOUTH DAILY   atorvastatin (LIPITOR) 40 MG tablet TAKE 1 TABLET(40 MG) BY MOUTH DAILY   carvedilol (COREG) 6.25 MG tablet TAKE 1 TABLET BY MOUTH TWICE DAILY WITH MEALS   clobetasol ointment (TEMOVATE) 0.05 % Apply 1 application  topically daily as needed for rash.   Coenzyme Q10 (CO Q10) 100 MG CAPS Take 200 mg by mouth daily.    Multiple Vitamins-Minerals (CENTRUM SILVER PO) Take 1 tablet by mouth daily.   REPATHA SURECLICK 140 MG/ML SOAJ ADMINISTER 1 ML UNDER THE SKIN EVERY 14 DAYS   No facility-administered medications prior to visit.   Review of Systems  Constitutional:  Negative for chills and fever.  HENT:  Negative for sore throat.   Respiratory:  Negative for cough and shortness of breath.   Cardiovascular:  Negative for chest pain, palpitations and leg swelling.  Gastrointestinal:  Negative for abdominal pain, blood in stool, constipation, diarrhea, nausea and vomiting.  Genitourinary:  Negative for dysuria and hematuria.  Musculoskeletal:  Negative for myalgias.  Skin:  Negative for itching and rash.  Neurological:  Negative for dizziness and headaches.  Psychiatric/Behavioral:  Negative for depression and suicidal ideas.       Objective:     BP (!) 150/82    Pulse 81   Ht 5\' 1"  (1.549 m)   Wt 132 lb 12.8 oz (60.2 kg)   SpO2 91%   BMI 25.09 kg/m  BP Readings from Last 3 Encounters:  01/08/23 (!) 150/82  07/04/22 138/74  06/18/22 (!) 143/83   Physical Exam Vitals reviewed.  Constitutional:      General: She is not in acute distress.    Appearance: Normal appearance. She is not toxic-appearing.  HENT:     Head: Normocephalic and atraumatic.     Right  Ear: External ear normal.     Left Ear: External ear normal.     Nose: Nose normal. No congestion or rhinorrhea.     Mouth/Throat:     Mouth: Mucous membranes are moist.     Pharynx: Oropharynx is clear. No oropharyngeal exudate or posterior oropharyngeal erythema.  Eyes:     General: No scleral icterus.    Extraocular Movements: Extraocular movements intact.     Conjunctiva/sclera: Conjunctivae normal.     Pupils: Pupils are equal, round, and reactive to light.  Cardiovascular:     Rate and Rhythm: Normal rate and regular rhythm.     Pulses: Normal pulses.     Heart sounds: Normal heart sounds. No murmur heard.    No friction rub. No gallop.  Pulmonary:     Effort: Pulmonary effort is normal.     Breath sounds: Normal breath sounds. No wheezing, rhonchi or rales.  Abdominal:     General: Abdomen is flat. Bowel sounds are normal. There is no distension.     Palpations: Abdomen is soft.     Tenderness: There is no abdominal tenderness.  Musculoskeletal:        General: No swelling. Normal range of motion.     Cervical back: Normal range of motion.     Right lower leg: No edema.     Left lower leg: No edema.  Lymphadenopathy:     Cervical: No cervical adenopathy.  Skin:    General: Skin is warm and dry.     Capillary Refill: Capillary refill takes less than 2 seconds.     Coloration: Skin is not jaundiced.  Neurological:     General: No focal deficit present.     Mental Status: She is alert and oriented to person, place, and time.  Psychiatric:        Mood and Affect:  Mood normal.        Behavior: Behavior normal.   Last CBC Lab Results  Component Value Date   WBC 6.8 01/03/2022   HGB 12.8 01/03/2022   HCT 38.9 01/03/2022   MCV 91 01/03/2022   MCH 29.8 01/03/2022   RDW 13.7 01/03/2022   PLT 198 01/03/2022   Last metabolic panel Lab Results  Component Value Date   GLUCOSE 91 01/03/2022   NA 144 01/03/2022   K 3.7 01/03/2022   CL 103 01/03/2022   CO2 27 01/03/2022   BUN 10 01/03/2022   CREATININE 0.64 01/03/2022   EGFR 93 01/03/2022   CALCIUM 10.2 01/03/2022   PROT 7.1 01/03/2022   ALBUMIN 4.7 01/03/2022   LABGLOB 2.4 01/03/2022   AGRATIO 2.0 01/03/2022   BILITOT 0.4 01/03/2022   ALKPHOS 97 01/03/2022   AST 22 01/03/2022   ALT 27 01/03/2022   ANIONGAP 10 02/15/2020   Last lipids Lab Results  Component Value Date   CHOL 136 01/03/2022   HDL 72 01/03/2022   LDLCALC 47 01/03/2022   TRIG 91 01/03/2022   CHOLHDL 1.9 01/03/2022   Last thyroid functions Lab Results  Component Value Date   TSH 1.360 01/03/2022   Last vitamin D Lab Results  Component Value Date   VD25OH 85.4 01/03/2022   Last vitamin B12 and Folate Lab Results  Component Value Date   VITAMINB12 959 01/03/2022   FOLATE 19.8 01/03/2022      Assessment & Plan:    Routine Health Maintenance and Physical Exam  Immunization History  Administered Date(s) Administered   Fluad Quad(high Dose 65+) 11/07/2020, 11/12/2022  Influenza, High Dose Seasonal PF 12/09/2017   Influenza-Unspecified 11/05/2019, 11/05/2021, 11/05/2021   Moderna Covid-19 Fall Seasonal Vaccine 73yrs & older 11/12/2022   Moderna SARS-COV2 Booster Vaccination 08/05/2020, 01/15/2021   Moderna Sars-Covid-2 Vaccination 03/13/2019, 04/13/2019, 12/21/2019   PNEUMOCOCCAL CONJUGATE-20 01/10/2021   Respiratory Syncytial Virus Vaccine,Recomb Aduvanted(Arexvy) 11/16/2021   Tdap 09/10/2012   Zoster Recombinant(Shingrix) 10/12/2021, 11/16/2021    Health Maintenance  Topic Date Due   MAMMOGRAM   08/31/2022   DTaP/Tdap/Td (2 - Td or Tdap) 09/11/2022   Medicare Annual Wellness (AWV)  02/26/2023   COVID-19 Vaccine (7 - 2023-24 season) 03/15/2023   Colonoscopy  12/20/2024   Pneumonia Vaccine 28+ Years old  Completed   INFLUENZA VACCINE  Completed   DEXA SCAN  Completed   Hepatitis C Screening  Completed   Zoster Vaccines- Shingrix  Completed   HPV VACCINES  Aged Out    Discussed health benefits of physical activity, and encouraged her to engage in regular exercise appropriate for her age and condition.  Problem List Items Addressed This Visit       Essential hypertension - Primary    BP is mildly elevated today.  She endorses compliance with amlodipine 10 mg daily and carvedilol 6.25 mg twice daily.  Previously well-controlled on this regimen.  She attributes today's elevated readings to stress. -No medication changes were made today.  Patient will follow-up in 1 month for BP check.  In the interim, I requested that she check and record home blood pressure readings to bring to her appointment.      Osteopenia    She remains on Fosamax 70 mg weekly (started in 2022) and vitamin D + calcium supplementation.      Mixed hyperlipidemia    She remains on atorvastatin and Repatha.  Repeat lipid panel ordered today.      Return in about 6 months (around 07/09/2023).  Billie Lade, MD

## 2023-01-08 NOTE — Patient Instructions (Signed)
It was a pleasure to see you today.  Thank you for giving [place] the opportunity to be involved in your care.  Below is a brief recap of your visit and next steps.  We will plan to see you again in 6 months.  Summary Annual physical completed today Repeat labs ordered No medication changes made Follow up with me in 6 months Follow up in 1 month for nurse visit for BP check

## 2023-01-09 LAB — CBC WITH DIFFERENTIAL/PLATELET
Basophils Absolute: 0 10*3/uL (ref 0.0–0.2)
Basos: 0 %
EOS (ABSOLUTE): 0.1 10*3/uL (ref 0.0–0.4)
Eos: 2 %
Hematocrit: 42.4 % (ref 34.0–46.6)
Hemoglobin: 13.4 g/dL (ref 11.1–15.9)
Immature Grans (Abs): 0 10*3/uL (ref 0.0–0.1)
Immature Granulocytes: 0 %
Lymphocytes Absolute: 1.9 10*3/uL (ref 0.7–3.1)
Lymphs: 25 %
MCH: 29.3 pg (ref 26.6–33.0)
MCHC: 31.6 g/dL (ref 31.5–35.7)
MCV: 93 fL (ref 79–97)
Monocytes Absolute: 0.4 10*3/uL (ref 0.1–0.9)
Monocytes: 6 %
Neutrophils Absolute: 5.1 10*3/uL (ref 1.4–7.0)
Neutrophils: 67 %
Platelets: 211 10*3/uL (ref 150–450)
RBC: 4.57 x10E6/uL (ref 3.77–5.28)
RDW: 13 % (ref 11.7–15.4)
WBC: 7.6 10*3/uL (ref 3.4–10.8)

## 2023-01-09 LAB — VITAMIN D 25 HYDROXY (VIT D DEFICIENCY, FRACTURES): Vit D, 25-Hydroxy: 84.4 ng/mL (ref 30.0–100.0)

## 2023-01-09 LAB — LIPID PANEL
Chol/HDL Ratio: 1.9 {ratio} (ref 0.0–4.4)
Cholesterol, Total: 148 mg/dL (ref 100–199)
HDL: 76 mg/dL (ref >39)
LDL Chol Calc (NIH): 56 mg/dL (ref 0–99)
Triglycerides: 82 mg/dL (ref 0–149)
VLDL Cholesterol Cal: 16 mg/dL (ref 5–40)

## 2023-01-09 LAB — CMP14+EGFR
ALT: 34 [IU]/L — ABNORMAL HIGH (ref 0–32)
AST: 31 [IU]/L (ref 0–40)
Albumin: 4.6 g/dL (ref 3.8–4.8)
Alkaline Phosphatase: 104 [IU]/L (ref 44–121)
BUN/Creatinine Ratio: 16 (ref 12–28)
BUN: 11 mg/dL (ref 8–27)
Bilirubin Total: 0.5 mg/dL (ref 0.0–1.2)
CO2: 26 mmol/L (ref 20–29)
Calcium: 9.8 mg/dL (ref 8.7–10.3)
Chloride: 102 mmol/L (ref 96–106)
Creatinine, Ser: 0.7 mg/dL (ref 0.57–1.00)
Globulin, Total: 2.7 g/dL (ref 1.5–4.5)
Glucose: 94 mg/dL (ref 70–99)
Potassium: 3.5 mmol/L (ref 3.5–5.2)
Sodium: 143 mmol/L (ref 134–144)
Total Protein: 7.3 g/dL (ref 6.0–8.5)
eGFR: 91 mL/min/{1.73_m2} (ref >59)

## 2023-01-09 LAB — B12 AND FOLATE PANEL
Folate: >20 ng/mL (ref >3.0)
Vitamin B-12: 1044 pg/mL (ref 232–1245)

## 2023-01-09 LAB — TSH+FREE T4
Free T4: 1.43 ng/dL (ref 0.82–1.77)
TSH: 0.928 u[IU]/mL (ref 0.450–4.500)

## 2023-01-20 ENCOUNTER — Encounter (HOSPITAL_COMMUNITY): Payer: Self-pay

## 2023-01-20 ENCOUNTER — Ambulatory Visit (HOSPITAL_COMMUNITY)
Admission: RE | Admit: 2023-01-20 | Discharge: 2023-01-20 | Disposition: A | Discharge status: Home / Self Care | Payer: Medicare Other | Source: Ambulatory Visit | Attending: Internal Medicine | Admitting: Internal Medicine

## 2023-01-20 DIAGNOSIS — Z1231 Encounter for screening mammogram for malignant neoplasm of breast: Secondary | ICD-10-CM | POA: Insufficient documentation

## 2023-01-22 ENCOUNTER — Other Ambulatory Visit (HOSPITAL_COMMUNITY): Payer: Self-pay | Admitting: Internal Medicine

## 2023-01-22 DIAGNOSIS — R928 Other abnormal and inconclusive findings on diagnostic imaging of breast: Secondary | ICD-10-CM

## 2023-02-11 ENCOUNTER — Ambulatory Visit (HOSPITAL_COMMUNITY)
Admission: RE | Admit: 2023-02-11 | Discharge: 2023-02-11 | Disposition: A | Discharge status: Home / Self Care | Payer: Medicare Other | Source: Ambulatory Visit | Attending: Internal Medicine | Admitting: Internal Medicine

## 2023-02-11 ENCOUNTER — Other Ambulatory Visit (HOSPITAL_COMMUNITY): Payer: Self-pay | Admitting: Internal Medicine

## 2023-02-11 DIAGNOSIS — R928 Other abnormal and inconclusive findings on diagnostic imaging of breast: Secondary | ICD-10-CM

## 2023-02-11 DIAGNOSIS — N6001 Solitary cyst of right breast: Secondary | ICD-10-CM | POA: Diagnosis not present

## 2023-02-11 DIAGNOSIS — R92333 Mammographic heterogeneous density, bilateral breasts: Secondary | ICD-10-CM | POA: Diagnosis not present

## 2023-02-11 DIAGNOSIS — N6311 Unspecified lump in the right breast, upper outer quadrant: Secondary | ICD-10-CM | POA: Diagnosis not present

## 2023-02-11 DIAGNOSIS — N6011 Diffuse cystic mastopathy of right breast: Secondary | ICD-10-CM | POA: Diagnosis not present

## 2023-02-13 ENCOUNTER — Ambulatory Visit (HOSPITAL_COMMUNITY)
Admission: RE | Admit: 2023-02-13 | Discharge: 2023-02-13 | Disposition: A | Discharge status: Home / Self Care | Payer: Medicare Other | Source: Ambulatory Visit | Attending: Internal Medicine | Admitting: Internal Medicine

## 2023-02-13 ENCOUNTER — Encounter (HOSPITAL_COMMUNITY): Payer: Self-pay

## 2023-02-13 DIAGNOSIS — R928 Other abnormal and inconclusive findings on diagnostic imaging of breast: Secondary | ICD-10-CM | POA: Insufficient documentation

## 2023-02-13 DIAGNOSIS — N6311 Unspecified lump in the right breast, upper outer quadrant: Secondary | ICD-10-CM | POA: Diagnosis not present

## 2023-02-13 HISTORY — PX: BREAST BIOPSY: SHX20

## 2023-02-13 MED ORDER — LIDOCAINE HCL (PF) 2 % IJ SOLN
INTRAMUSCULAR | Status: AC
  Filled 2023-02-13: qty 10

## 2023-02-13 MED ORDER — LIDOCAINE-EPINEPHRINE (PF) 1 %-1:200000 IJ SOLN
10.0000 mL | Freq: Once | INTRAMUSCULAR | Status: AC
  Administered 2023-02-13: 10 mL via INTRADERMAL

## 2023-02-13 MED ORDER — LIDOCAINE HCL (PF) 2 % IJ SOLN
10.0000 mL | Freq: Once | INTRAMUSCULAR | Status: AC
  Administered 2023-02-13: 10 mL

## 2023-02-13 MED ORDER — LIDOCAINE-EPINEPHRINE (PF) 1 %-1:200000 IJ SOLN
INTRAMUSCULAR | Status: AC
  Filled 2023-02-13: qty 30

## 2023-02-13 NOTE — Progress Notes (Signed)
 Pt brought to US  suite in no obvious distress. Procedure explained, consent obtained. Prepped and draped in sterile manner. Local anesthesia admin without adverse event. Imaging obtained. Samples obtained under US  guidance. Tolerated well. Clip placed. Wound care done, bandage placed. No bleeding, no hematoma. Transferred to mammogram.

## 2023-02-14 LAB — SURGICAL PATHOLOGY

## 2023-03-04 ENCOUNTER — Ambulatory Visit (INDEPENDENT_AMBULATORY_CARE_PROVIDER_SITE_OTHER): Payer: Medicare Other

## 2023-03-04 VITALS — Ht 64.0 in | Wt 132.0 lb

## 2023-03-04 DIAGNOSIS — Z0000 Encounter for general adult medical examination without abnormal findings: Secondary | ICD-10-CM | POA: Diagnosis not present

## 2023-03-04 DIAGNOSIS — Z780 Asymptomatic menopausal state: Secondary | ICD-10-CM | POA: Diagnosis not present

## 2023-03-04 DIAGNOSIS — Z1211 Encounter for screening for malignant neoplasm of colon: Secondary | ICD-10-CM

## 2023-03-04 DIAGNOSIS — Z1212 Encounter for screening for malignant neoplasm of rectum: Secondary | ICD-10-CM

## 2023-03-04 NOTE — Progress Notes (Signed)
Because this visit was a virtual/telehealth visit,  certain criteria was not obtained, such a blood pressure, CBG if applicable, and timed get up and go. Any medications not marked as "taking" were not mentioned during the medication reconciliation part of the visit. Any vitals not documented were not able to be obtained due to this being a telehealth visit or patient was unable to self-report a recent blood pressure reading due to a lack of equipment at home via telehealth. Vitals that have been documented are verbally provided by the patient.  Interactive audio and video telecommunications were attempted between this provider and patient, however failed, due to patient having technical difficulties OR patient did not have access to video capability.  We continued and completed visit with audio only.  Subjective:   Alyssa Navarro is a 73 y.o. female who presents for Medicare Annual (Subsequent) preventive examination.  Visit Complete: Virtual I connected with  Alyssa Navarro on 03/04/23 by a video and audio enabled telemedicine application and verified that I am speaking with the correct person using two identifiers.  Patient Location: Home  Provider Location: Home Office  I discussed the limitations of evaluation and management by telemedicine. The patient expressed understanding and agreed to proceed.  Vital Signs: Because this visit was a virtual/telehealth visit, some criteria may be missing or patient reported. Any vitals not documented were not able to be obtained and vitals that have been documented are patient reported.  Patient Medicare AWV questionnaire was completed by the patient on 03/02/2023; I have confirmed that all information answered by patient is correct and no changes since this date.        Objective:    There were no vitals filed for this visit. There is no height or weight on file to calculate BMI.     03/04/2023   10:05 AM 02/25/2022   10:45 AM 02/22/2021    11:11 AM 02/22/2020   10:09 AM 02/17/2020    6:28 AM 02/15/2020    1:42 PM 02/07/2020   12:36 PM  Advanced Directives  Does Patient Have a Medical Advance Directive? No Yes No Yes No No No  Type of Advance Directive  Living will  Living will     Does patient want to make changes to medical advance directive?  No - Patient declined No - Patient declined No - Patient declined     Would patient like information on creating a medical advance directive? No - Patient declined  No - Patient declined No - Patient declined No - Patient declined No - Patient declined     Current Medications (verified) Outpatient Encounter Medications as of 03/04/2023  Medication Sig   Acetaminophen (TYLENOL 8 HOUR PO) Take 1 tablet by mouth as needed.   alendronate (FOSAMAX) 70 MG tablet TAKE 1 TABLET(70 MG) BY MOUTH EVERY 7 DAYS WITH A FULL GLASS OF WATER AND ON AN EMPTY STOMACH   amLODipine (NORVASC) 10 MG tablet TAKE 1 TABLET(10 MG) BY MOUTH DAILY   atorvastatin (LIPITOR) 40 MG tablet TAKE 1 TABLET(40 MG) BY MOUTH DAILY   carvedilol (COREG) 6.25 MG tablet TAKE 1 TABLET BY MOUTH TWICE DAILY WITH MEALS   clobetasol ointment (TEMOVATE) 0.05 % Apply 1 application  topically daily as needed for rash.   Coenzyme Q10 (CO Q10) 100 MG CAPS Take 200 mg by mouth daily.    Multiple Vitamins-Minerals (CENTRUM SILVER PO) Take 1 tablet by mouth daily.   REPATHA SURECLICK 140 MG/ML SOAJ ADMINISTER 1 ML UNDER  THE SKIN EVERY 14 DAYS   No facility-administered encounter medications on file as of 03/04/2023.    Allergies (verified) Avelox [moxifloxacin hcl in nacl], Quinolones, Statins, Latex, Metoprolol, and Sulfa antibiotics   History: Past Medical History:  Diagnosis Date   Allergy    Anemia    Arthritis    Coronary artery disease    Hyperlipidemia    Hypertension    Screening for malignant neoplasm of the cervix 06/23/2013   Trichimoniasis 07/19/2013   Past Surgical History:  Procedure Laterality Date   2D  Echocardiogram  12/22/2007   EF >55%, normal.   ABDOMINAL HYSTERECTOMY     ANKLE FRACTURE SURGERY Left 02/17/2020   APPENDECTOMY     ARTHROSCOPY WITH ANTERIOR CRUCIATE LIGAMENT (ACL) REPAIR WITH ANTERIOR TIBILIAS GRAFT Right 2007   BREAST BIOPSY Right 12/25/2015   fibrocystic changes with adenosis and calcifications   BREAST BIOPSY Right 02/13/2023   path pending   BREAST BIOPSY Right 02/13/2023   [place] RT BREAST BX W LOC DEV 1ST LESION IMG BX SPEC US GUIDE 02/13/2023 [facility]   BREAST CYST ASPIRATION Bilateral 09/23/2005   BUNIONECTOMY Bilateral    CARDIAC CATHETERIZATION  02/12/1991   CAD demonstrated in Proximal LAD-50% stenosis, Distal LAD-60%, Mid Circumflex-60% stenosis, Marginal Circumflex-80% stenosis, Proximal RCA-70% stenosis   CARDIAC CATHETERIZATION  11/12/1993   Mild-moderate 3-vessel disease-50% mid LAD, 75% mid circumflex, 80% ostial portion L circumflex.   CARDIAC CATHETERIZATION  12/03/1996   95% mid circumflex stenosis stented with a 3.5x44mm Sci-Med NIR, resulting in reduciton to 0%. 75% distal circumflex stenosis stented with a 3.0x20mm NIR stent resulting in reduciton to 0%.   CARDIAC CATHETERIZATION  08/23/2003   Continued medical therapy. Patent L circumflex stents placed in 1998.   CHOLECYSTECTOMY     COLONOSCOPY N/A 12/21/2014   Procedure: COLONOSCOPY;  Surgeon: Malissa Hippo, MD;  Location: [facility];  Service: Endoscopy;  Laterality: N/A;  830   FRACTURE SURGERY  January 13,2022   Fx. Left Ankle   JOINT REPLACEMENT  April 23,1998   [facility] ([facility] foot   Lexiscan Myoview  12/22/2007   No ECG changes, nondiagnostic EKG. Perfusion defect in inferior myocardial region consistent with diaphragmatic attenuation.   ORIF ANKLE FRACTURE Left 02/17/2020   Procedure: OPEN REDUCTION INTERNAL FIXATION (ORIF) ANKLE FRACTURE;  Surgeon: Oliver Barre, MD;  Location: [facility];  Service: Orthopedics;  Laterality: Left;   TUBAL LIGATION     Family History   Problem Relation Age of Onset   Diabetes Mother    Hypertension Mother    Hyperlipidemia Mother    Arthritis Mother    Heart disease Mother    Heart failure Other    Hypertension Other    Diabetes Other    Hyperlipidemia Father    Alcohol abuse Father    Heart disease Father    Hypertension Father    Heart disease Sister    Cancer Maternal Aunt        breast   Breast cancer Maternal Aunt    Cancer Maternal Grandmother        breast   Diabetes Maternal Grandmother    Hypertension Paternal Grandmother    Heart failure Paternal Grandmother    Breast cancer Paternal Grandmother    Fibroids Sister    Social History   Socioeconomic History   Marital status: Divorced    Spouse name: Not on file   Number of children: 2   Years of education: 12   Highest education  level: Associate degree: academic program  Occupational History   Occupation: retired    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  Tobacco Use   Smoking status: Former    Current packs/day: 0.00    Average packs/day: 0.3 packs/day for 15.0 years (3.8 ttl pk-yrs)    Types: Cigarettes    Start date: 01/29/1984    Quit date: 01/29/1999    Years since quitting: 24.1   Smokeless tobacco: Never   Tobacco comments:    Stress smoker  Vaping Use   Vaping status: Never Used  Substance and Sexual Activity   Alcohol use: No   Drug use: Never   Sexual activity: Not Currently    Birth control/protection: Abstinence, Surgical  Other Topics Concern   Not on file  Social History Narrative   Not on file   Social Drivers of Health   Financial Resource Strain: Medium Risk (03/02/2023)   Overall Financial Resource Strain (CARDIA)    Difficulty of Paying Living Expenses: Somewhat hard  Food Insecurity: No Food Insecurity (03/02/2023)   Hunger Vital Sign    Worried About Running Out of Food in the Last Year: Never true    Ran Out of Food in the Last Year: Never true  Transportation Needs: No Transportation Needs (03/02/2023)    PRAPARE - Administrator, Civil Service (Medical): No    Lack of Transportation (Non-Medical): No  Physical Activity: Insufficiently Active (03/02/2023)   Exercise Vital Sign    Days of Exercise per Week: 1 day    Minutes of Exercise per Session: 20 min  Stress: No Stress Concern Present (03/02/2023)   [employer] of Occupational Health - Occupational Stress Questionnaire    Feeling of Stress : Not at all  Social Connections: Moderately Integrated (03/02/2023)   Social Connection and Isolation Panel [NHANES]    Frequency of Communication with Friends and Family: More than three times a week    Frequency of Social Gatherings with Friends and Family: More than three times a week    Attends Religious Services: More than 4 times per year    Active Member of [employer] or Organizations: Yes    Attends Engineer, structural: More than 4 times per year    Marital Status: Divorced    Tobacco Counseling Counseling given: Not Answered Tobacco comments: Stress smoker   Clinical Intake:                        Activities of Daily Living    03/02/2023    2:52 PM  In your present state of health, do you have any difficulty performing the following activities:  Hearing? 0  Vision? 0  Difficulty concentrating or making decisions? 0  Walking or climbing stairs? 0  Dressing or bathing? 0  Doing errands, shopping? 0  Preparing Food and eating ? N  Using the Toilet? N  In the past six months, have you accidently leaked urine? N  Do you have problems with loss of bowel control? N  Managing your Medications? N  Managing your Finances? N  Housekeeping or managing your Housekeeping? N    Patient Care Team: Billie Lade, MD as PCP - General (Internal Medicine) Croitoru, Rachelle Hora, MD as PCP - Cardiology (Cardiology)  Indicate any recent Medical Services you may have received from other than [facility] providers in the past year (date may be approximate).      Assessment:   This is a routine wellness examination for  Shanquita.  Hearing/Vision screen No results found.   Goals Addressed             This Visit's Progress    Patient Stated       Take a trip out of the country       Depression Screen    03/04/2023   10:12 AM 01/08/2023    3:42 PM 07/04/2022    8:11 AM 06/18/2022    2:28 PM 02/25/2022   10:42 AM 01/03/2022    8:22 AM 07/04/2021    8:07 AM  PHQ 2/9 Scores  PHQ - 2 Score 0 0 0 1 0 0 0  PHQ- 9 Score 0     0     Fall Risk    03/02/2023    2:52 PM 01/08/2023    3:42 PM 07/04/2022    8:10 AM 06/18/2022    2:28 PM 02/25/2022   10:46 AM  Fall Risk   Falls in the past year? 0 0 0 0 0  Number falls in past yr:  0 0 0 0  Injury with Fall?  0 0 0 0  Risk for fall due to :  No Fall Risks No Fall Risks  No Fall Risks  Follow up  Falls evaluation completed Falls evaluation completed  Falls evaluation completed    MEDICARE RISK AT HOME: Medicare Risk at Home Any stairs in or around the home?: (Patient-Rptd) Yes If so, are there any without handrails?: (Patient-Rptd) No Home free of loose throw rugs in walkways, pet beds, electrical cords, etc?: (Patient-Rptd) Yes Adequate lighting in your home to reduce risk of falls?: (Patient-Rptd) Yes Life alert?: (Patient-Rptd) No Use of a cane, walker or w/c?: (Patient-Rptd) No Grab bars in the bathroom?: (Patient-Rptd) Yes Shower chair or bench in shower?: (Patient-Rptd) No Elevated toilet seat or a handicapped toilet?: (Patient-Rptd) No  TIMED UP AND GO:  Was the test performed?  No    Cognitive Function:        02/25/2022   10:49 AM 02/22/2021   11:16 AM 02/22/2020   10:13 AM  6CIT Screen  What Year? 0 points 0 points 0 points  What month? 0 points 0 points 0 points  What time? 0 points 0 points 0 points  Count back from 20 0 points 0 points 0 points  Months in reverse 0 points 0 points 0 points  Repeat phrase 0 points 2 points 0 points  Total Score 0 points 2 points 0  points    Immunizations Immunization History  Administered Date(s) Administered   Fluad Quad(high Dose 65+) 11/07/2020, 11/12/2022   Influenza, High Dose Seasonal PF 12/09/2017   Influenza-Unspecified 11/05/2019, 11/05/2021, 11/05/2021   Moderna Covid-19 Fall Seasonal Vaccine 83yrs & older 11/12/2022   Moderna SARS-COV2 Booster Vaccination 08/05/2020, 01/15/2021   Moderna Sars-Covid-2 Vaccination 03/13/2019, 04/13/2019, 12/21/2019   PNEUMOCOCCAL CONJUGATE-20 01/10/2021   Respiratory Syncytial Virus Vaccine,Recomb Aduvanted(Arexvy) 11/16/2021   Tdap 09/10/2012   Zoster Recombinant(Shingrix) 10/12/2021, 11/16/2021    TDAP status: Due, Education has been provided regarding the importance of this vaccine. Advised may receive this vaccine at local pharmacy or Health Dept. Aware to provide a copy of the vaccination record if obtained from local pharmacy or Health Dept. Verbalized acceptance and understanding.  Flu Vaccine status: Up to date  Pneumococcal vaccine status: Up to date  Covid-19 vaccine status: Completed vaccines  Qualifies for Shingles Vaccine? No   Zostavax completed No   Shingrix Completed?: Yes  Screening Tests Health Maintenance  Topic Date Due   Fecal DNA (Cologuard)  Never done   DEXA SCAN  09/01/2020   DTaP/Tdap/Td (2 - Td or Tdap) 09/11/2022   MAMMOGRAM  01/20/2024   Medicare Annual Wellness (AWV)  03/03/2024   Pneumonia Vaccine 67+ Years old  Completed   INFLUENZA VACCINE  Completed   COVID-19 Vaccine  Completed   Hepatitis C Screening  Completed   Zoster Vaccines- Shingrix  Completed   HPV VACCINES  Aged Out   Colonoscopy  Discontinued    Health Maintenance  Health Maintenance Due  Topic Date Due   Fecal DNA (Cologuard)  Never done   DEXA SCAN  09/01/2020   DTaP/Tdap/Td (2 - Td or Tdap) 09/11/2022    Colorectal Cancer Screening: Cologuard ordered 03/04/2023  Mammogram status: Completed 01/20/2023. Repeat every year  Bone Density status:  Ordered 03/04/2023. Pt provided with contact info and advised to call to schedule appt.  Lung Cancer Screening: (Low Dose CT Chest recommended if Age 59-80 years, 20 pack-year currently smoking OR have quit w/in 15years.) does not qualify.   Lung Cancer Screening Referral: na  Additional Screening:  Hepatitis C Screening: does not qualify; Completed   Vision Screening: Recommended annual ophthalmology exams for early detection of glaucoma and other disorders of the eye. Is the patient up to date with their annual eye exam?  No  Who is the provider or what is the name of the office in which the patient attends annual eye exams? Patient does not have an eye care provider and declines referral. No vision difficulties If pt is not established with a provider, would they like to be referred to a provider to establish care? No .   Dental Screening: Recommended annual dental exams for proper oral hygiene  Diabetic Foot Exam: na  Community Resource Referral / Chronic Care Management: CRR required this visit?  No   CCM required this visit?  No     Plan:     I have personally reviewed and noted the following in the patient's chart:   Medical and social history Use of alcohol, tobacco or illicit drugs  Current medications and supplements including opioid prescriptions. Patient is not currently taking opioid prescriptions. Functional ability and status Nutritional status Physical activity Advanced directives List of other physicians Hospitalizations, surgeries, and ER visits in previous 12 months Vitals Screenings to include cognitive, depression, and falls Referrals and appointments  In addition, I have reviewed and discussed with patient certain preventive protocols, quality metrics, and best practice recommendations. A written personalized care plan for preventive services as well as general preventive health recommendations were provided to patient.     Jordan Hawks Kala Gassmann,  CMA   03/04/2023   After Visit Summary: (MyChart) Due to this being a telephonic visit, the after visit summary with patients personalized plan was offered to patient via MyChart

## 2023-03-04 NOTE — Patient Instructions (Signed)
Ms. Alyssa Navarro , Thank you for taking time to come for your Medicare Wellness Visit. I appreciate your ongoing commitment to your health goals. Please review the following plan we discussed and let me know if I can assist you in the future.   Referrals/Orders/Follow-Ups/Clinician Recommendations:  Next Medicare Annual Wellness Visit:March 08, 2024 at 9:20 am video visit  [x]  An order has been placed for a Cologuard for you. They will mail you the kit with instructions on how to obtain the sample and send it back in to be tested. If you do not received your kit, please call this number and they will be able to assist you.  [employer]: [phone]  You have an order for:  []   2D Mammogram  [x]   3D Mammogram  []   Bone Density   []   Lung Cancer Screening  Please call for appointment:   [facility] [address] -Radiology [place], [place] [address] [phone]  Make sure to wear two-piece clothing.  No lotions powders or deodorants the day of the appointment Make sure to bring picture ID and insurance card.  Bring list of medications you are currently taking including any supplements.   Schedule your [place] screening mammogram through MyChart!   Log into your MyChart account.  Go to 'Visit' (or 'Appointments' if on mobile App) --> Schedule an Appointment  Under 'Select a Reason for Visit' choose the Mammogram Screening option.  Complete the pre-visit questions and select the time and place that best fits your schedule.    This is a list of the screening recommended for you and due dates:  Health Maintenance  Topic Date Due   Cologuard (Stool DNA test)  Never done   DEXA scan (bone density measurement)  09/01/2020   DTaP/Tdap/Td vaccine (2 - Td or Tdap) 09/11/2022   Mammogram  01/20/2024   Medicare Annual Wellness Visit  03/03/2024   Pneumonia Vaccine  Completed   Flu Shot  Completed   COVID-19 Vaccine  Completed   Hepatitis C  Screening  Completed   Zoster (Shingles) Vaccine  Completed   HPV Vaccine  Aged Out   Colon Cancer Screening  Discontinued    Advanced directives: (ACP Link)Information on Advanced Care Planning can be found at [facility] Advance Health Care Directives Advance Health Care Directives ([email or web address]   Next Medicare Annual Wellness Visit scheduled for next year: yes  Preventive Care 65 Years and Older, Female Preventive care refers to lifestyle choices and visits with your health care provider that can promote health and wellness. Preventive care visits are also called wellness exams. What can I expect for my preventive care visit? Counseling Your health care provider may ask you questions about your: Medical history, including: Past medical problems. Family medical history. Pregnancy and menstrual history. History of falls. Current health, including: Memory and ability to understand (cognition). Emotional well-being. Home life and relationship well-being. Sexual activity and sexual health. Lifestyle, including: Alcohol, nicotine or tobacco, and drug use. Access to firearms. Diet, exercise, and sleep habits. Work and work Astronomer. Sunscreen use. Safety issues such as seatbelt and bike helmet use. Physical exam Your health care provider will check your: Height and weight. These may be used to calculate your BMI (body mass index). BMI is a measurement that tells if you are at a healthy weight. Waist circumference. This measures the distance around your waistline. This measurement also tells if you are at a healthy weight and may  help predict your risk of certain diseases, such as type 2 diabetes and high blood pressure. Heart rate and blood pressure. Body temperature. Skin for abnormal spots. What immunizations do I need?  Vaccines are usually given at various ages, according to a schedule. Your health care provider will recommend vaccines for you based on  your age, medical history, and lifestyle or other factors, such as travel or where you work. What tests do I need? Screening Your health care provider may recommend screening tests for certain conditions. This may include: Lipid and cholesterol levels. Hepatitis C test. Hepatitis B test. HIV (human immunodeficiency virus) test. STI (sexually transmitted infection) testing, if you are at risk. Lung cancer screening. Colorectal cancer screening. Diabetes screening. This is done by checking your blood sugar (glucose) after you have not eaten for a while (fasting). Mammogram. Talk with your health care provider about how often you should have regular mammograms. BRCA-related cancer screening. This may be done if you have a family history of breast, ovarian, tubal, or peritoneal cancers. Bone density scan. This is done to screen for osteoporosis. Talk with your health care provider about your test results, treatment options, and if necessary, the need for more tests. Follow these instructions at home: Eating and drinking  Eat a diet that includes fresh fruits and vegetables, whole grains, lean protein, and low-fat dairy products. Limit your intake of foods with high amounts of sugar, saturated fats, and salt. Take vitamin and mineral supplements as recommended by your health care provider. Do not drink alcohol if your health care provider tells you not to drink. If you drink alcohol: Limit how much you have to 0-1 drink a day. Know how much alcohol is in your drink. In the [place] one drink equals one 12 oz bottle of beer (355 mL), one 5 oz glass of wine (148 mL), or one 1 oz glass of hard liquor (44 mL). Lifestyle Brush your teeth every morning and night with fluoride toothpaste. Floss one time each day. Exercise for at least 30 minutes 5 or more days each week. Do not use any products that contain nicotine or tobacco. These products include cigarettes, chewing tobacco, and vaping devices,  such as e-cigarettes. If you need help quitting, ask your health care provider. Do not use drugs. If you are sexually active, practice safe sex. Use a condom or other form of protection in order to prevent STIs. Take aspirin only as told by your health care provider. Make sure that you understand how much to take and what form to take. Work with your health care provider to find out whether it is safe and beneficial for you to take aspirin daily. Ask your health care provider if you need to take a cholesterol-lowering medicine (statin). Find healthy ways to manage stress, such as: Meditation, yoga, or listening to music. Journaling. Talking to a trusted person. Spending time with friends and family. Minimize exposure to UV radiation to reduce your risk of skin cancer. Safety Always wear your seat belt while driving or riding in a vehicle. Do not drive: If you have been drinking alcohol. Do not ride with someone who has been drinking. When you are tired or distracted. While texting. If you have been using any mind-altering substances or drugs. Wear a helmet and other protective equipment during sports activities. If you have firearms in your house, make sure you follow all gun safety procedures. What's next? Visit your health care provider once a year for an annual wellness visit.  Ask your health care provider how often you should have your eyes and teeth checked. Stay up to date on all vaccines. This information is not intended to replace advice given to you by your health care provider. Make sure you discuss any questions you have with your health care provider. Document Revised: 07/19/2020 Document Reviewed: 07/19/2020 Elsevier Patient Education  2024 [employer].  Understanding Your Risk for Falls Millions of people have serious injuries from falls each year. It is important to understand your risk of falling. Talk with your health care provider about your risk and what you can do  to lower it. If you do have a serious fall, make sure to tell your provider. Falling once raises your risk of falling again. How can falls affect me? Serious injuries from falls are common. These include: Broken bones, such as hip fractures. Head injuries, such as traumatic brain injuries (TBI) or concussions. A fear of falling can cause you to avoid activities and stay at home. This can make your muscles weaker and raise your risk for a fall. What can increase my risk? There are a number of risk factors that increase your risk for falling. The more risk factors you have, the higher your risk of falling. Serious injuries from a fall happen most often to people who are older than 75 years old. Teenagers and young adults ages 1-29 are also at higher risk. Common risk factors include: Weakness in the lower body. Being generally weak or confused due to long-term (chronic) illness. Dizziness or balance problems. Poor vision. Medicines that cause dizziness or drowsiness. These may include: Medicines for your blood pressure, heart, anxiety, insomnia, or swelling (edema). Pain medicines. Muscle relaxants. Other risk factors include: Drinking alcohol. Having had a fall in the past. Having foot pain or wearing improper footwear. Working at a dangerous job. Having any of the following in your home: Tripping hazards, such as floor clutter or loose rugs. Poor lighting. Pets. Having dementia or memory loss. What actions can I take to lower my risk of falling?     Physical activity Stay physically fit. Do strength and balance exercises. Consider taking a regular class to build strength and balance. Yoga and tai chi are good options. Vision Have your eyes checked every year and your prescription for glasses or contacts updated as needed. Shoes and walking aids Wear non-skid shoes. Wear shoes that have rubber soles and low heels. Do not wear high heels. Do not walk around the house in socks  or slippers. Use a cane or walker as told by your provider. Home safety Attach secure railings on both sides of your stairs. Install grab bars for your bathtub, shower, and toilet. Use a non-skid mat in your bathtub or shower. Attach bath mats securely with double-sided, non-slip rug tape. Use good lighting in all rooms. Keep a flashlight near your bed. Make sure there is a clear path from your bed to the bathroom. Use night-lights. Do not use throw rugs. Make sure all carpeting is taped or tacked down securely. Remove all clutter from walkways and stairways, including extension cords. Repair uneven or broken steps and floors. Avoid walking on icy or slippery surfaces. Walk on the grass instead of on icy or slick sidewalks. Use ice melter to get rid of ice on walkways in the winter. Use a cordless phone. Questions to ask your health care provider Can you help me check my risk for a fall? Do any of my medicines make me more likely  to fall? Should I take a vitamin D supplement? What exercises can I do to improve my strength and balance? Should I make an appointment to have my vision checked? Do I need a bone density test to check for weak bones (osteoporosis)? Would it help to use a cane or a walker? Where to find more information Centers for Disease Control and Prevention, STEADI: TonerPromos.no Community-Based Fall Prevention Programs: TonerPromos.no [employer] on Aging: BaseRingTones.pl Contact a health care provider if: You fall at home. You are afraid of falling at home. You feel weak, drowsy, or dizzy. This information is not intended to replace advice given to you by your health care provider. Make sure you discuss any questions you have with your health care provider. Document Revised: 09/24/2021 Document Reviewed: 09/24/2021 Elsevier Patient Education  2024 [employer].

## 2023-03-05 ENCOUNTER — Other Ambulatory Visit: Payer: Self-pay | Admitting: Family Medicine

## 2023-03-11 ENCOUNTER — Other Ambulatory Visit (HOSPITAL_COMMUNITY): Payer: Medicare Other

## 2023-03-11 ENCOUNTER — Encounter (HOSPITAL_COMMUNITY): Payer: Medicare Other

## 2023-03-14 DIAGNOSIS — Z1211 Encounter for screening for malignant neoplasm of colon: Secondary | ICD-10-CM | POA: Diagnosis not present

## 2023-03-14 DIAGNOSIS — Z1212 Encounter for screening for malignant neoplasm of rectum: Secondary | ICD-10-CM | POA: Diagnosis not present

## 2023-03-24 ENCOUNTER — Encounter: Payer: Self-pay | Admitting: Internal Medicine

## 2023-03-24 LAB — COLOGUARD: COLOGUARD: NEGATIVE

## 2023-04-07 ENCOUNTER — Telehealth: Payer: Self-pay | Admitting: Cardiovascular Disease

## 2023-04-07 ENCOUNTER — Other Ambulatory Visit: Payer: Self-pay | Admitting: Cardiovascular Disease

## 2023-04-07 ENCOUNTER — Other Ambulatory Visit: Payer: Self-pay

## 2023-04-07 MED ORDER — AMLODIPINE BESYLATE 10 MG PO TABS: ORAL_TABLET | ORAL | 0 refills | Status: DC

## 2023-04-07 NOTE — Telephone Encounter (Signed)
*  STAT* If patient is at the pharmacy, call can be transferred to refill team.   1. Which medications need to be refilled? (please list name of each medication and dose if known) amLODipine (NORVASC) 10 MG tablet   2. Which pharmacy/location (including street and city if local pharmacy) is medication to be sent to?  [id number] - [place], [place] - [address]      3. Do they need a 30 day or 90 day supply? 90 day

## 2023-05-08 ENCOUNTER — Other Ambulatory Visit: Payer: Self-pay | Admitting: Cardiovascular Disease

## 2023-05-12 ENCOUNTER — Encounter: Payer: Self-pay | Admitting: Cardiovascular Disease

## 2023-05-12 ENCOUNTER — Ambulatory Visit: Payer: Medicare (Managed Care) | Attending: Cardiovascular Disease | Admitting: Cardiovascular Disease

## 2023-05-12 VITALS — BP 136/78 | HR 77 | Ht 64.0 in | Wt 131.1 lb

## 2023-05-12 DIAGNOSIS — I10 Essential (primary) hypertension: Secondary | ICD-10-CM | POA: Diagnosis not present

## 2023-05-12 DIAGNOSIS — E7801 Familial hypercholesterolemia: Secondary | ICD-10-CM

## 2023-05-12 DIAGNOSIS — I2510 Atherosclerotic heart disease of native coronary artery without angina pectoris: Secondary | ICD-10-CM

## 2023-05-12 DIAGNOSIS — E78011 Heterozygous familial hypercholesterolemia (hefh): Secondary | ICD-10-CM

## 2023-05-12 MED ORDER — AMLODIPINE BESYLATE 10 MG PO TABS: ORAL_TABLET | ORAL | 3 refills | Status: AC

## 2023-05-12 MED ORDER — CARVEDILOL 6.25 MG PO TABS: 6.2500 mg | ORAL_TABLET | Freq: Two times a day (BID) | ORAL | 3 refills | Status: AC

## 2023-05-12 MED ORDER — ATORVASTATIN CALCIUM 40 MG PO TABS: 40.0000 mg | ORAL_TABLET | Freq: Every day | ORAL | 3 refills | Status: AC

## 2023-05-12 NOTE — Patient Instructions (Signed)
 Medication Instructions:  No changes *If you need a refill on your cardiac medications before your next appointment, please call your pharmacy*  Follow-Up: At [facility], you and your health needs are our priority.  As part of our continuing mission to provide you with exceptional heart care, our providers are all part of one team.  This team includes your primary Cardiologist (physician) and Advanced Practice Providers or APPs (Physician Assistants and Nurse Practitioners) who all work together to provide you with the care you need, when you need it.  Your next appointment:   1 year(s)  Provider:   Thurmon Fair, MD     We recommend signing up for the patient portal called "MyChart".  Sign up information is provided on this After Visit Summary.  MyChart is used to connect with patients for Virtual Visits (Telemedicine).  Patients are able to view lab/test results, encounter notes, upcoming appointments, etc.  Non-urgent messages can be sent to your provider as well.   To learn more about what you can do with MyChart, go to [email or web address].        1st Floor: - Lobby - Registration  - Pharmacy  - Lab - Cafe  2nd Floor: - PV Lab - Diagnostic Testing (echo, CT, nuclear med)  3rd Floor: - Vacant  4th Floor: - TCTS (cardiothoracic surgery) - AFib Clinic - Structural Heart Clinic - Vascular Surgery  - Vascular Ultrasound  5th Floor: - HeartCare Cardiology (general and EP) - Clinical Pharmacy for coumadin, hypertension, lipid, weight-loss medications, and med management appointments    Valet parking services will be available as well.

## 2023-05-12 NOTE — Progress Notes (Signed)
 Cardiology Office Note:    Date:  05/12/2023    ID:  Alyssa Navarro, DOB [birth year], MRN [id number]  PCP:  Billie Lade, MD  [facility] Cardiologist:  Thurmon Fair, MD  [facility] Electrophysiologist:  None   Referring MD: Billie Lade, MD   CC: CAD follow up   History of Present Illness:    Alyssa Navarro is a 73 y.o. female with a hx of severe hypercholesterolemia, HTN, CAD with stent placement in 1998, HLD who presents today for follow up.  The patient specifically denies any chest pain at rest or with exertion, dyspnea at rest or with exertion, orthopnea, paroxysmal nocturnal dyspnea, syncope, palpitations, focal neurological deficits, intermittent claudication, lower extremity edema, unexplained weight gain, cough, hemoptysis or wheezing.  She has trouble with a high cost of the Repatha.  When she was on statin monotherapy her LDL cholesterol was too high.  Similarly on Repatha monotherapy her cholesterol was still too high she clearly needs both medications.  Her recent labs from December show an LDL of 56, excellent HDL of 76 and  triglycerides are normal range at 82.  She has made major improvements to her diet, no longer eats beef and pork, eats only fish and chicken and has a mostly vegetable based diet.  Most importantly she is avoiding processed food, cooks her own meals from scratch.  Finds it hard to exercise when the weather is cold, has bilateral knee pain when it rains.   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 She required percutaneous revascularization in 1998 (3.5x25 NIR to mid dominant left circumflex artery, 3.0x16 NIR to distal LCX, 50-60% mid LAD untreated), but no further procedures since that time.  Past Medical History:  Diagnosis Date   Allergy    Anemia    Arthritis    Coronary artery disease    Hyperlipidemia    Hypertension    Screening  for malignant neoplasm of the cervix 06/23/2013   Trichimoniasis 07/19/2013    Past Surgical History:  Procedure Laterality Date   2D Echocardiogram  12/22/2007   EF >55%, normal.   ABDOMINAL HYSTERECTOMY     ANKLE FRACTURE SURGERY Left 02/17/2020   APPENDECTOMY     ARTHROSCOPY WITH ANTERIOR CRUCIATE LIGAMENT (ACL) REPAIR WITH ANTERIOR TIBILIAS GRAFT Right 2007   BREAST BIOPSY Right 12/25/2015   fibrocystic changes with adenosis and calcifications   BREAST BIOPSY Right 02/13/2023   path pending   BREAST BIOPSY Right 02/13/2023   [place] RT BREAST BX W LOC DEV 1ST LESION IMG BX SPEC US GUIDE 02/13/2023 [facility]   BREAST CYST ASPIRATION Bilateral 09/23/2005   BUNIONECTOMY Bilateral    CARDIAC CATHETERIZATION  02/12/1991   CAD demonstrated in Proximal LAD-50% stenosis, Distal LAD-60%, Mid Circumflex-60% stenosis, Marginal Circumflex-80% stenosis, Proximal RCA-70% stenosis   CARDIAC CATHETERIZATION  11/12/1993   Mild-moderate 3-vessel disease-50% mid LAD, 75% mid circumflex, 80% ostial portion L circumflex.   CARDIAC CATHETERIZATION  12/03/1996   95% mid circumflex stenosis stented with a 3.5x24mm Sci-Med NIR, resulting in reduciton to 0%. 75% distal circumflex stenosis stented with a 3.0x28mm NIR stent resulting in reduciton to 0%.   CARDIAC CATHETERIZATION  08/23/2003   Continued medical therapy. Patent L circumflex stents placed in 1998.   CHOLECYSTECTOMY     COLONOSCOPY N/A 12/21/2014   Procedure: COLONOSCOPY;  Surgeon: Malissa Hippo, MD;  Location: [facility] ENDO SUITE;  Service: Endoscopy;  Laterality: N/A;  830   FRACTURE SURGERY  January 13,2022   Fx. Left Ankle   JOINT REPLACEMENT  April 23,1998   [facility] (R) foot   Lexiscan Myoview  12/22/2007   No ECG changes, nondiagnostic EKG. Perfusion defect in inferior myocardial region consistent with diaphragmatic attenuation.   ORIF ANKLE FRACTURE Left 02/17/2020   Procedure: OPEN REDUCTION INTERNAL FIXATION (ORIF) ANKLE  FRACTURE;  Surgeon: Oliver Barre, MD;  Location: [facility] ORS;  Service: Orthopedics;  Laterality: Left;   TUBAL LIGATION      Current Medications: Current Meds  Medication Sig   alendronate (FOSAMAX) 70 MG tablet TAKE 1 TABLET(70 MG) BY MOUTH EVERY 7 DAYS WITH A FULL GLASS OF WATER AND ON AN EMPTY STOMACH   amLODipine (NORVASC) 10 MG tablet TAKE 1 TABLET(10 MG) BY MOUTH DAILY   carvedilol (COREG) 6.25 MG tablet TAKE 1 TABLET BY MOUTH TWICE DAILY WITH MEALS   Multiple Vitamins-Minerals (CENTRUM SILVER PO) Take 1 tablet by mouth daily.   REPATHA SURECLICK 140 MG/ML SOAJ ADMINISTER 1 ML UNDER THE SKIN EVERY 14 DAYS   [DISCONTINUED] atorvastatin (LIPITOR) 40 MG tablet TAKE 1 TABLET(40 MG) BY MOUTH DAILY    Allergies:   Avelox [moxifloxacin hcl in nacl], Quinolones, Statins, Latex, Metoprolol, and Sulfa antibiotics   Family History: The patient's family history includes Alcohol abuse in her father; Arthritis in her mother; Breast cancer in her maternal aunt and paternal grandmother; Cancer in her maternal aunt and maternal grandmother; Diabetes in her maternal grandmother, mother, and another family member; Fibroids in her sister; Heart disease in her father, mother, and sister; Heart failure in her paternal grandmother and another family member; Hyperlipidemia in her father and mother; Hypertension in her father, mother, paternal grandmother, and another family member.  ROS:   Please see the history of present illness.    All other systems reviewed and are negative.  EKGs/Labs/Other Studies Reviewed:    EKG:    EKG Interpretation Date/Time:    Ventricular Rate:    PR Interval:    QRS Duration:    QT Interval:    QTC Calculation:   R Axis:      Text Interpretation:           Recent Labs: 01/08/2023: ALT 34; BUN 11; Creatinine, Ser 0.70; Hemoglobin 13.4; Platelets 211; Potassium 3.5; Sodium 143; TSH 0.928  Recent Lipid Panel    Component Value Date/Time   CHOL 148 01/08/2023 1621    CHOL 246 08/21/2018 0910   TRIG 82 01/08/2023 1621   TRIG 211 (A) 08/21/2018 0910   HDL 76 01/08/2023 1621   HDL 58 08/21/2018 0910   CHOLHDL 1.9 01/08/2023 1621   CHOLHDL 4.0 03/21/2017 0743   VLDL 46 (H) 09/22/2015 0835   LDLCALC 56 01/08/2023 1621    Physical Exam:    VS:  BP 136/78 (BP Location: Left Arm, Patient Position: Sitting, Cuff Size: Normal)   Pulse 77   Ht 5\' 4"  (1.626 m)   Wt 131 lb 1.6 oz (59.5 kg)   SpO2 95%   BMI 22.50 kg/m     Wt Readings from Last 3 Encounters:  05/12/23 131 lb 1.6 oz (59.5 kg)  03/04/23 132 lb (59.9 kg)  01/08/23 132 lb 12.8 oz (60.2 kg)      General: Alert, oriented x3, no distress, appears lean and fit, younger than stated age Head: no evidence of trauma, PERRL, EOMI, no exophtalmos or lid lag, no myxedema, no xanthelasma; normal ears, nose  and oropharynx Neck: normal jugular venous pulsations and no hepatojugular reflux; brisk carotid pulses without delay and faint right carotid bruit Chest: clear to auscultation, no signs of consolidation by percussion or palpation, normal fremitus, symmetrical and full respiratory excursions Cardiovascular: normal position and quality of the apical impulse, regular rhythm, normal first and second heart sounds, no murmurs, rubs or gallops Abdomen: no tenderness or distention, no masses by palpation, no abnormal pulsatility or arterial bruits, normal bowel sounds, no hepatosplenomegaly Extremities: no clubbing, cyanosis or edema; 2+ radial, ulnar and brachial pulses bilaterally; 2+ right femoral, posterior tibial and dorsalis pedis pulses; 2+ left femoral, posterior tibial and dorsalis pedis pulses; no subclavian or femoral bruits Neurological: grossly nonfocal Psych: Normal mood and affect   ASSESSMENT:    1. Coronary artery disease involving native coronary artery of native heart without angina pectoris   2. Essential hypertension   3. Heterozygous familial hypercholesterolemia      PLAN:     In order of problems listed above:  CAD: Asymptomatic, on statin-PCSK9 inhibitor/aspirin/beta-blocker. HTN: Well-controlled. HLP: Labs consistent with familial heterozygous hypercholesterolemia.  Has required both statin and Repatha to maintain LDL less than 70. Right carotid bruit: No obstructive lesions, just mild plaque on ultrasound.  Medication Adjustments/Labs and Tests Ordered: Current medicines are reviewed at length with the patient today.  Concerns regarding medicines are outlined above.  Orders Placed This Encounter  Procedures   EKG 12-Lead    Meds ordered this encounter  Medications   atorvastatin (LIPITOR) 40 MG tablet    Sig: Take 1 tablet (40 mg total) by mouth daily.    Dispense:  90 tablet    Refill:  3     Patient Instructions  Medication Instructions:  No changes *If you need a refill on your cardiac medications before your next appointment, please call your pharmacy*  Follow-Up: At [facility], you and your health needs are our priority.  As part of our continuing mission to provide you with exceptional heart care, our providers are all part of one team.  This team includes your primary Cardiologist (physician) and Advanced Practice Providers or APPs (Physician Assistants and Nurse Practitioners) who all work together to provide you with the care you need, when you need it.  Your next appointment:   1 year(s)  Provider:   Thurmon Fair, MD     We recommend signing up for the patient portal called "MyChart".  Sign up information is provided on this After Visit Summary.  MyChart is used to connect with patients for Virtual Visits (Telemedicine).  Patients are able to view lab/test results, encounter notes, upcoming appointments, etc.  Non-urgent messages can be sent to your provider as well.   To learn more about what you can do with MyChart, go to [email or web address].         1st Floor: - Lobby - Registration  - Pharmacy  - Lab -  Cafe  2nd Floor: - PV Lab - Diagnostic Testing (echo, CT, nuclear med)  3rd Floor: - Vacant  4th Floor: - TCTS (cardiothoracic surgery) - AFib Clinic - Structural Heart Clinic - Vascular Surgery  - Vascular Ultrasound  5th Floor: - HeartCare Cardiology (general and EP) - Clinical Pharmacy for coumadin, hypertension, lipid, weight-loss medications, and med management appointments    Valet parking services will be available as well.      Sign Thurmon Fair, MD, [facility] [phone] 05/12/2023, 8:40 AM

## 2023-06-04 ENCOUNTER — Encounter: Payer: Medicare (Managed Care) | Admitting: Orthopedic Surgery

## 2023-06-10 ENCOUNTER — Other Ambulatory Visit (INDEPENDENT_AMBULATORY_CARE_PROVIDER_SITE_OTHER): Payer: Medicare (Managed Care)

## 2023-06-10 ENCOUNTER — Ambulatory Visit: Payer: Medicare (Managed Care) | Admitting: Orthopedic Surgery

## 2023-06-10 ENCOUNTER — Encounter: Payer: Self-pay | Admitting: Orthopedic Surgery

## 2023-06-10 VITALS — BP 137/83 | HR 84 | Ht 64.0 in | Wt 132.0 lb

## 2023-06-10 DIAGNOSIS — M79644 Pain in right finger(s): Secondary | ICD-10-CM | POA: Diagnosis not present

## 2023-06-10 DIAGNOSIS — S6991XA Unspecified injury of right wrist, hand and finger(s), initial encounter: Secondary | ICD-10-CM

## 2023-06-10 NOTE — Progress Notes (Signed)
 [facility] Return  Assessment: Alyssa Navarro is a 73 y.o. female with the following: Jammed right ring finger, DIP joint   Plan: Alyssa Navarro has pain in the right ring finger.  She reports that she has jammed it several times while doing work around the house.  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  Similar in appearance to the DIP joint of the small finger.  She is lacking a little bit of range of motion.  She also has a mild deformity and swelling at this joint.  Anticipate gradual resolution.  No limitations in function.  She can return to activities as tolerated.  When she is active, she can consider buddy taping her ring finger.  She states understanding.  Follow-up as needed.  Follow-up: Return if symptoms worsen or fail to improve.   Subjective:  Chief Complaint  Patient presents with   Hand Injury    R hand ring finger multiple jams to same finger.     History of Present Illness: Alyssa Navarro is a 73 y.o. female who returns to clinic for evaluation of right hand pain.  She reports that she remains active.  She jammed her right ring finger several times over the past couple months.  She continues to have pain, swelling and restricted motion of the DIP joint of the ring finger.  She takes medications occasionally.  She has not tried any splinting.  Review of Systems: No fevers or chills No numbness or tingling No chest pain No shortness of breath No bowel or bladder dysfunction No GI distress No headaches   Objective: BP 137/83   Pulse 84   Ht 5\' 4"  (1.626 m)   Wt 132 lb (59.9 kg)   BMI 22.66 kg/m   Physical Exam:  Evaluation of the right hand demonstrates deformity of the ring and small fingers.  DIP deformity of the ring finger.  There is swelling in this area.  Mild tenderness to palpation.  She is lacking some flexion.  Almost able make a full fist, except for the ring finger.   Fingers are warm and well-perfused.  IMAGING: I personally ordered and reviewed the following images:   X-rays of the right ring finger were obtained in clinic today.  No acute injuries noted.  Advanced degenerative changes at the DIP joint of the ring finger.  There are subchondral cysts.  Possible fracture through the base of the radial side of the distal phalanx.  Appearance of the DIP joint is similar to the DIP joint of the small finger.  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

## 2023-08-24 ENCOUNTER — Other Ambulatory Visit: Payer: Self-pay | Admitting: Cardiovascular Disease

## 2024-02-06 ENCOUNTER — Other Ambulatory Visit: Payer: Self-pay | Admitting: Internal Medicine

## 2024-03-08 ENCOUNTER — Ambulatory Visit: Payer: Medicare Other

## 2024-03-19 ENCOUNTER — Ambulatory Visit: Payer: Medicare (Managed Care)

## 2024-04-30 ENCOUNTER — Ambulatory Visit
